# Patient Record
Sex: Female | Born: 1990 | Race: Black or African American | Hispanic: No | Smoking: Former smoker
Health system: Southern US, Community
[De-identification: ages and names within clinical notes are randomized; demographics above are authoritative.]

## PROBLEM LIST (undated history)

## (undated) ENCOUNTER — Inpatient Hospital Stay (HOSPITAL_COMMUNITY): Payer: Self-pay

## (undated) DIAGNOSIS — A6000 Herpesviral infection of urogenital system, unspecified: Secondary | ICD-10-CM

## (undated) DIAGNOSIS — IMO0001 Reserved for inherently not codable concepts without codable children: Secondary | ICD-10-CM

## (undated) DIAGNOSIS — F1010 Alcohol abuse, uncomplicated: Secondary | ICD-10-CM

## (undated) DIAGNOSIS — D649 Anemia, unspecified: Secondary | ICD-10-CM

## (undated) HISTORY — PX: TONSILLECTOMY: SUR1361

---
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
## 2010-06-03 ENCOUNTER — Ambulatory Visit: Payer: Self-pay | Admitting: Family Medicine

## 2010-06-03 DIAGNOSIS — R350 Frequency of micturition: Secondary | ICD-10-CM | POA: Insufficient documentation

## 2010-06-03 DIAGNOSIS — S335XXA Sprain of ligaments of lumbar spine, initial encounter: Secondary | ICD-10-CM

## 2010-06-03 DIAGNOSIS — N898 Other specified noninflammatory disorders of vagina: Secondary | ICD-10-CM | POA: Insufficient documentation

## 2010-06-03 LAB — CONVERTED CEMR LAB
Ketones, urine, test strip: NEGATIVE
Nitrite: NEGATIVE
Specific Gravity, Urine: >=1.03
Urobilinogen, UA: 0.2
WBC Urine, dipstick: NEGATIVE

## 2010-06-04 ENCOUNTER — Encounter: Payer: Self-pay | Admitting: Family Medicine

## 2010-06-07 LAB — CONVERTED CEMR LAB: GC Probe Amp, Urine: NEGATIVE

## 2010-08-17 ENCOUNTER — Emergency Department (HOSPITAL_COMMUNITY): Admission: EM | Admit: 2010-08-17 | Discharge: 2010-08-18 | Payer: Self-pay | Admitting: Emergency Medicine

## 2011-01-08 LAB — CONVERTED CEMR LAB
ALT: 13 units/L (ref 0–35)
Albumin: 3.3 g/dL — ABNORMAL LOW (ref 3.5–5.2)
Basophils Relative: 0.7 % (ref 0.0–3.0)
Bilirubin, Direct: 0 mg/dL (ref 0.0–0.3)
CO2: 27 meq/L (ref 19–32)
Chloride: 108 meq/L (ref 96–112)
Creatinine, Ser: 0.7 mg/dL (ref 0.4–1.2)
Eosinophils Absolute: 0 10*3/uL (ref 0.0–0.7)
Eosinophils Relative: 0.3 % (ref 0.0–5.0)
HCT: 31.3 % — ABNORMAL LOW (ref 36.0–46.0)
Hemoglobin: 10.2 g/dL — ABNORMAL LOW (ref 12.0–15.0)
Hgb A1c MFr Bld: 5.7 % (ref 4.6–6.5)
LDL Cholesterol: 73 mg/dL (ref 0–99)
Lymphs Abs: 1.8 10*3/uL (ref 0.7–4.0)
MCHC: 32.7 g/dL (ref 30.0–36.0)
MCV: 82.7 fL (ref 78.0–100.0)
Monocytes Absolute: 0.4 10*3/uL (ref 0.1–1.0)
Neutro Abs: 2.6 10*3/uL (ref 1.4–7.7)
Potassium: 4 meq/L (ref 3.5–5.1)
RBC: 3.78 M/uL — ABNORMAL LOW (ref 3.87–5.11)
Sodium: 140 meq/L (ref 135–145)
Total CHOL/HDL Ratio: 3
Total Protein: 6.5 g/dL (ref 6.0–8.3)
Triglycerides: 47 mg/dL (ref 0.0–149.0)
Uric Acid, Serum: 3 mg/dL (ref 2.4–7.0)
Vitamin B-12: 169 pg/mL — ABNORMAL LOW (ref 211–911)
WBC: 4.8 10*3/uL (ref 4.5–10.5)

## 2011-01-12 NOTE — Assessment & Plan Note (Signed)
Summary: HEADACHE AND BACK PAIN--PH   Vital Signs:  Patient profile:   20 year old female Weight:      207 pounds BMI:     32.78 Pulse rate:   78 / minute Pulse rhythm:   regular BP sitting:   120 / 78  (left arm) Cuff size:   large  Vitals Entered By: Army Fossa CMA (June 03, 2010 1:49 PM) CC: Pt here states that she has lower left back pain, urinating more frequently, Back Pain   History of Present Illness:       This is a 20 year old woman who presents with Back Pain.  The symptoms began 1 week ago.  The patient denies fever, chills, weakness, loss of sensation, fecal incontinence, urinary incontinence, urinary retention, dysuria, rest pain, inability to work, and inability to care for self.  The pain is located in the left low back.  The pain began at home and gradually.  The pain is made worse by lying down.  The pain is made better by acetaminophen and heat.    Current Medications (verified): 1)  Mobic 15 Mg Tabs (Meloxicam) .... 1/2-1 By Mouth Once Daily As Needed 2)  Flexeril 10 Mg Tabs (Cyclobenzaprine Hcl) .... 1/2 - 1 By Mouth Three Times A Day As Needed  Allergies (verified): No Known Drug Allergies  Past History:  Past medical, surgical, family and social histories (including risk factors) reviewed for relevance to current acute and chronic problems.  Family History: Reviewed history from 05/06/2009 and no changes required. Family History of Alcoholism/Addiction Family History of CAD Female 1st degree relative <50 Family History Hypertension Family History of Stroke F 1st degree relative <60 Uncles--- cancer  Social History: Reviewed history from 05/06/2009 and no changes required. Occupation:  Consulting civil engineer,  [employer] Never Smoked Alcohol use-yes Drug use-no Regular exercise-no  Review of Systems      See HPI  Physical Exam  General:  Well-developed,well-nourished,in no acute distress; alert,appropriate and cooperative throughout  examination Abdomen:  Bowel sounds positive,abdomen soft and non-tender without masses, organomegaly or hernias noted. Msk:  normal ROM, no joint tenderness, no joint swelling, and no joint warmth.   Extremities:  No clubbing, cyanosis, edema, or deformity noted with normal full range of motion of all joints.   Neurologic:  alert & oriented X3, strength normal in all extremities, gait normal, and DTRs symmetrical and normal.   Skin:  Intact without suspicious lesions or rashes Psych:  Oriented X3 and normally interactive.     Impression & Recommendations:  Problem # 1:  BACK STRAIN, LUMBAR (ICD-847.2)  flexeril three times a day as needed  mobic once daily as needed  heat  stretching rto 2 weeks or sooner   Orders: UA Dipstick w/o Micro (manual) (62130)  Problem # 2:  FREQUENCY, URINARY (ICD-788.41)  Orders: T-Culture, Urine ([id number]) UA Dipstick w/o Micro (manual) (95284)  Complete Medication List: 1)  Mobic 15 Mg Tabs (Meloxicam) .... 1/2-1 by mouth once daily as needed 2)  Flexeril 10 Mg Tabs (Cyclobenzaprine hcl) .... 1/2 - 1 by mouth three times a day as needed  Other Orders: T-Chlamydia  Probe, urine [id number]) T-GC Probe, urine ([id number]) Prescriptions: FLEXERIL 10 MG TABS (CYCLOBENZAPRINE HCL) 1/2 - 1 by mouth three times a day as needed  #30 x 0   Entered and Authorized by:   Loreen Freud DO   Signed by:   Loreen Freud DO on 06/03/2010   Method used:   Electronically to  [facility]  [address] [id number] (retail)       [address]       [place], [place]  [address]       Ph: [phone] or [phone]       Fax: [phone]   RxID:   [id number] MOBIC 15 MG TABS (MELOXICAM) 1/2-1 by mouth once daily as needed  #30 x 1   Entered and Authorized by:   Loreen Freud DO   Signed by:   Loreen Freud DO on 06/03/2010   Method used:   [facility]ically to        [facility]  [address] [id number] ([facility]       [address]       [place], [place]  [address]       Ph: [phone] or [phone]       Fax: [phone]   RxID:   [id number]   Laboratory Results   Urine Tests    Routine Urinalysis   Color: straw Appearance: Clear Glucose: negative   (Normal Range: Negative) Bilirubin: small   (Normal Range: Negative) Ketone: negative   (Normal Range: Negative) Spec. Gravity: >=1.030   (Normal Range: 1.003-1.035) Blood: negative   (Normal Range: Negative) pH: 6.5   (Normal Range: 5.0-8.0) Protein: negative   (Normal Range: Negative) Urobilinogen: 0.2   (Normal Range: 0-1) Nitrite: negative   (Normal Range: Negative) Leukocyte Esterace: negative   (Normal Range: Negative)    Comments: Army Fossa CMA  June 03, 2010 1:56 PM

## 2011-02-26 LAB — URINALYSIS, ROUTINE W REFLEX MICROSCOPIC
Bilirubin Urine: NEGATIVE
Glucose, UA: NEGATIVE mg/dL
Leukocytes, UA: NEGATIVE
Nitrite: NEGATIVE
Specific Gravity, Urine: 1.021 (ref 1.005–1.030)
pH: 8.5 — ABNORMAL HIGH (ref 5.0–8.0)

## 2011-02-26 LAB — URINE MICROSCOPIC-ADD ON

## 2011-04-25 NOTE — Op Note (Signed)
Elizabeth David, Elizabeth David              ACCOUNT NO.:  [id number]   MEDICAL RECORD NO.:  [id number]          PATIENT TYPE:  AMB   LOCATION:  [facility]                          FACILITY:  [facility]   PHYSICIAN:  Suzanna Obey, M.D.       DATE OF BIRTH:  Dec 12, 1990   DATE OF PROCEDURE:  11/28/2007  DATE OF DISCHARGE:                               OPERATIVE REPORT   PREOPERATIVE DIAGNOSIS:  Chronic tonsillitis.   POSTOPERATIVE DIAGNOSIS:  Chronic tonsillitis.   SURGICAL PROCEDURE:  Tonsillectomy and adenoidectomy.   ANESTHESIA:  General.   ESTIMATED BLOOD LOSS:  Less than 5 mL.   INDICATIONS FOR PROCEDURE:  This is a 20 year old with repetitive  tonsillitis problems that had been refractory to medical therapy.  The  parents were informed of the risks and benefits of the procedure and  options were discussed.  All questions were answered and consent was  obtained.   OPERATION:  The patient was taken to the operating room and placed in  the supine position.  After adequate general endotracheal tube  anesthesia, was placed in the Rose position, draped in the usual sterile  fashion.  The left tonsil was begun by making a left anterior tonsillar  pillar incision, identifying the capsule of the tonsil, and removing it  with electrocautery dissection.  The right tonsil was removed in the  same fashion.  The adenoid tissue was examined with a mirror and removed  with the suction cautery.  There was a moderate amount of adenoid tissue  present.  The Crowe-Davis was released and resuspended.  There was good  hemostasis present in all locations.  The nasopharynx was irrigated with  saline.  The Crowe-Davis was released again and there was good  hemostasis with suction cautery.  The patient was awakened and brought  to the recovery room in stable condition.  Counts were correct.           ______________________________  Suzanna Obey, M.D.     JB/MEDQ  D:  11/28/2007  T:  11/28/2007  Job:  [id number]

## 2011-06-04 ENCOUNTER — Inpatient Hospital Stay (INDEPENDENT_AMBULATORY_CARE_PROVIDER_SITE_OTHER)
Admission: RE | Admit: 2011-06-04 | Discharge: 2011-06-04 | Disposition: A | Discharge status: Home / Self Care | Payer: Managed Care, Other (non HMO) | Source: Ambulatory Visit | Attending: Family Medicine | Admitting: Family Medicine

## 2011-06-04 DIAGNOSIS — M5430 Sciatica, unspecified side: Secondary | ICD-10-CM

## 2011-06-26 ENCOUNTER — Ambulatory Visit (INDEPENDENT_AMBULATORY_CARE_PROVIDER_SITE_OTHER): Payer: Managed Care, Other (non HMO) | Admitting: Family Medicine

## 2011-06-26 ENCOUNTER — Encounter: Payer: Self-pay | Admitting: Family Medicine

## 2011-06-26 ENCOUNTER — Telehealth: Payer: Self-pay

## 2011-06-26 ENCOUNTER — Ambulatory Visit (HOSPITAL_COMMUNITY)
Admission: RE | Admit: 2011-06-26 | Discharge: 2011-06-26 | Disposition: A | Discharge status: Home / Self Care | Payer: Managed Care, Other (non HMO) | Source: Ambulatory Visit | Attending: Family Medicine | Admitting: Family Medicine

## 2011-06-26 VITALS — BP 138/80 | HR 124 | Temp 99.1°F | Wt 220.4 lb

## 2011-06-26 DIAGNOSIS — M5136 Other intervertebral disc degeneration, lumbar region: Secondary | ICD-10-CM

## 2011-06-26 DIAGNOSIS — M545 Low back pain, unspecified: Secondary | ICD-10-CM | POA: Insufficient documentation

## 2011-06-26 DIAGNOSIS — M549 Dorsalgia, unspecified: Secondary | ICD-10-CM

## 2011-06-26 DIAGNOSIS — M5137 Other intervertebral disc degeneration, lumbosacral region: Secondary | ICD-10-CM | POA: Insufficient documentation

## 2011-06-26 DIAGNOSIS — M419 Scoliosis, unspecified: Secondary | ICD-10-CM

## 2011-06-26 DIAGNOSIS — IMO0002 Reserved for concepts with insufficient information to code with codable children: Secondary | ICD-10-CM | POA: Insufficient documentation

## 2011-06-26 DIAGNOSIS — M51369 Other intervertebral disc degeneration, lumbar region without mention of lumbar back pain or lower extremity pain: Secondary | ICD-10-CM

## 2011-06-26 DIAGNOSIS — M79609 Pain in unspecified limb: Secondary | ICD-10-CM | POA: Insufficient documentation

## 2011-06-26 DIAGNOSIS — M51379 Other intervertebral disc degeneration, lumbosacral region without mention of lumbar back pain or lower extremity pain: Secondary | ICD-10-CM | POA: Insufficient documentation

## 2011-06-26 DIAGNOSIS — M4120 Other idiopathic scoliosis, site unspecified: Secondary | ICD-10-CM | POA: Insufficient documentation

## 2011-06-26 MED ORDER — MELOXICAM 15 MG PO TABS: 15.0000 mg | ORAL_TABLET | Freq: Every day | ORAL | Status: DC

## 2011-06-26 NOTE — Telephone Encounter (Signed)
Discussed with patient and she voiced understanding. Referral put in        KP 

## 2011-06-26 NOTE — Patient Instructions (Signed)
Back Pain & Injury Your back pain is most likely caused by a strain of the muscles or ligaments supporting the spine. Back strains cause pain and trouble moving because of muscle spasms. They may take several weeks to heal. Usually they are better in days.  Treatment for back pain includes:  Rest - Get bed rest as needed over the next day or two. Use a firm mattress and lie on your side with your knees slightly bent. If you lie on your back, put a pillow under your knees.   Early movement - Back pain improves most rapidly if you remain active. It is much more stressful on the back to sit or stand in one place. Do not sit, drive or stand in one place for more than 30 minutes at a time. Take short walks on level surfaces as soon as pain allows.   Limit bending and lifting - Do not bend over or lift anything over 20 pounds until instructed otherwise. Lift by bending your knees. Use your leg muscles to help. Keep the load close to your body and avoid twisting. Do not reach or do overhead work.   Medicines - Medicine to reduce pain and inflammation are helpful. Muscle-relaxing drugs may be prescribed.   Therapy - Put ice packs on your back every few hours for the first 2-3 days after your injury or as instructed. After that ice or heat may be alternated to reduce pain and spasm. Back exercises and gentle massage may be of some benefit. You should be examined again if your back pain is not better in one week.  SEEK IMMEDIATE MEDICAL CARE IF:  You have pain that radiates from your back into your legs.   You develop new bowel or bladder control problems.   You have unusual weakness or numbness in your arms or legs.   You develop nausea or vomiting.   You develop abdominal pain.   You feel faint.  Document Released: 11/27/2005 Document Re-Released: 09/05/2008 Exit[employer] Patient Information 2011 [employer]. 

## 2011-06-26 NOTE — Progress Notes (Signed)
  Subjective:    Patient ID: Elizabeth David, female    DOB: July 16, 1991, 20 y.o.   MRN: [id number]  HPI    Review of Systems     Objective:   Physical Exam        Assessment & Plan:   Subjective:    Elizabeth David is a 20 y.o. female who presents for follow up of low back problems. Current symptoms include: numbness in Left foot and leg and pain in L leg/ foot (burning and throbbing in character; 9/10 in severity). Symptoms have significantly improved from the previous visit. Exacerbating factors identified by the patient are recumbency, sitting, standing and walking.  The following portions of the patient's history were reviewed and updated as appropriate: allergies, current medications, past family history, past medical history, past social history, past surgical history and problem list.    Objective:    BP 138/80  Pulse 124  Temp(Src) 99.1 F (37.3 C) (Oral)  Wt 220 lb 6.4 oz (99.973 kg)  SpO2 99% General appearance: alert, cooperative, appears stated age and no distress Extremities: extremities normal, atraumatic, no cyanosis or edema Pulses: 2+ and symmetric Skin: Skin color, texture, turgor normal. No rashes or lesions Neurologic: Sensory: normal Motor: decreased plantar flexion of L foot and toe.  4/5 strengh L low leg Reflexes: normal 2+ and symmetric Coordination: normal Gait: Antalgic    Assessment:    Nonspecific acute low back pain    Plan:    Natural history and expected course discussed. Questions answered. Agricultural engineer distributed. Short (2-4 day) period of relative rest recommended until acute symptoms improve. Ice to affected area as needed for local pain relief. Heat to affected area as needed for local pain relief. OTC analgesics as needed. Follow-up in 2 weeks. mobic given to pt and check xray

## 2011-06-26 NOTE — Telephone Encounter (Signed)
Message copied by Arnette Norris on Mon Jun 26, 2011  2:44 PM ------      Message from: Lelon Perla      Created: Mon Jun 26, 2011  1:03 PM       Some scoliosis and degen changes      We can refer to sports med

## 2011-09-13 LAB — CBC
HCT: 39.3
Hemoglobin: 12.9
Platelets: 322
RBC: 4.79
WBC: 6.5

## 2011-09-13 LAB — URINE CULTURE

## 2011-09-13 LAB — DIFFERENTIAL
Eosinophils Relative: 0
Lymphocytes Relative: 23 — ABNORMAL LOW
Lymphs Abs: 1.5
Monocytes Relative: 7

## 2011-09-13 LAB — URINALYSIS, ROUTINE W REFLEX MICROSCOPIC
Bilirubin Urine: NEGATIVE
Glucose, UA: NEGATIVE
Hgb urine dipstick: NEGATIVE
Ketones, ur: NEGATIVE
Protein, ur: NEGATIVE
pH: 6

## 2011-09-13 LAB — BASIC METABOLIC PANEL
BUN: 8
Potassium: 3.9
Sodium: 138

## 2011-09-13 LAB — RPR: RPR Ser Ql: NONREACTIVE

## 2011-09-13 LAB — URINE MICROSCOPIC-ADD ON

## 2011-09-13 LAB — WET PREP, GENITAL

## 2011-09-15 LAB — POCT HEMOGLOBIN-HEMACUE: Operator id: 12362

## 2011-09-25 ENCOUNTER — Other Ambulatory Visit: Payer: Self-pay | Admitting: Obstetrics & Gynecology

## 2011-09-29 ENCOUNTER — Emergency Department (HOSPITAL_COMMUNITY)
Admission: EM | Admit: 2011-09-29 | Discharge: 2011-09-29 | Disposition: A | Discharge status: Home / Self Care | Payer: Managed Care, Other (non HMO) | Attending: Emergency Medicine | Admitting: Emergency Medicine

## 2011-09-29 DIAGNOSIS — O99330 Smoking (tobacco) complicating pregnancy, unspecified trimester: Secondary | ICD-10-CM | POA: Insufficient documentation

## 2011-09-29 DIAGNOSIS — O036 Delayed or excessive hemorrhage following complete or unspecified spontaneous abortion: Secondary | ICD-10-CM | POA: Insufficient documentation

## 2011-09-29 LAB — DIFFERENTIAL
Basophils Relative: 0 % (ref 0–1)
Monocytes Relative: 8 % (ref 3–12)
Neutro Abs: 5.1 10*3/uL (ref 1.7–7.7)
Neutrophils Relative %: 67 % (ref 43–77)

## 2011-09-29 LAB — CBC
HCT: 31.8 % — ABNORMAL LOW (ref 36.0–46.0)
MCH: 28 pg (ref 26.0–34.0)
MCV: 84.1 fL (ref 78.0–100.0)
Platelets: 314 10*3/uL (ref 150–400)
RBC: 3.78 MIL/uL — ABNORMAL LOW (ref 3.87–5.11)
WBC: 7.6 10*3/uL (ref 4.0–10.5)

## 2011-09-29 LAB — BASIC METABOLIC PANEL
BUN: 8 mg/dL (ref 6–23)
CO2: 24 mEq/L (ref 19–32)
Chloride: 104 mEq/L (ref 96–112)
Creatinine, Ser: 0.6 mg/dL (ref 0.50–1.10)
GFR calc Af Amer: >90 mL/min (ref >90)
Glucose, Bld: 99 mg/dL (ref 70–99)
Potassium: 3.2 mEq/L — ABNORMAL LOW (ref 3.5–5.1)

## 2011-09-30 ENCOUNTER — Encounter (HOSPITAL_COMMUNITY): Admission: RE | Payer: Self-pay | Source: Ambulatory Visit

## 2011-09-30 ENCOUNTER — Ambulatory Visit (HOSPITAL_COMMUNITY)
Admission: RE | Admit: 2011-09-30 | Payer: Managed Care, Other (non HMO) | Source: Ambulatory Visit | Admitting: Obstetrics and Gynecology

## 2011-09-30 SURGERY — DILATION AND EVACUATION, UTERUS
Anesthesia: Choice

## 2011-12-21 ENCOUNTER — Encounter: Payer: Self-pay | Admitting: Family Medicine

## 2011-12-21 ENCOUNTER — Ambulatory Visit (INDEPENDENT_AMBULATORY_CARE_PROVIDER_SITE_OTHER): Payer: Managed Care, Other (non HMO) | Admitting: Family Medicine

## 2011-12-21 DIAGNOSIS — T380X5A Adverse effect of glucocorticoids and synthetic analogues, initial encounter: Secondary | ICD-10-CM

## 2011-12-21 DIAGNOSIS — J329 Chronic sinusitis, unspecified: Secondary | ICD-10-CM

## 2011-12-21 DIAGNOSIS — J40 Bronchitis, not specified as acute or chronic: Secondary | ICD-10-CM

## 2011-12-21 MED ORDER — GUAIFENESIN-CODEINE 100-10 MG/5ML PO SYRP: ORAL_SOLUTION | ORAL | Status: DC

## 2011-12-21 MED ORDER — PREDNISONE 10 MG PO TABS: ORAL_TABLET | ORAL | Status: DC

## 2011-12-21 MED ORDER — AMOXICILLIN-POT CLAVULANATE 875-125 MG PO TABS: 1.0000 | ORAL_TABLET | Freq: Two times a day (BID) | ORAL | Status: AC

## 2011-12-21 NOTE — Patient Instructions (Signed)
Bronchitis Bronchitis is the body's way of reacting to injury and/or infection (inflammation) of the bronchi. Bronchi are the air tubes that extend from the windpipe into the lungs. If the inflammation becomes severe, it may cause shortness of breath. CAUSES  Inflammation may be caused by:  A virus.   Germs (bacteria).   Dust.   Allergens.   Pollutants and many other irritants.  The cells lining the bronchial tree are covered with tiny hairs (cilia). These constantly beat upward, away from the lungs, toward the mouth. This keeps the lungs free of pollutants. When these cells become too irritated and are unable to do their job, mucus begins to develop. This causes the characteristic cough of bronchitis. The cough clears the lungs when the cilia are unable to do their job. Without either of these protective mechanisms, the mucus would settle in the lungs. Then you would develop pneumonia. Smoking is a common cause of bronchitis and can contribute to pneumonia. Stopping this habit is the single most important thing you can do to help yourself. TREATMENT   Your caregiver may prescribe an antibiotic if the cough is caused by bacteria. Also, medicines that open up your airways make it easier to breathe. Your caregiver may also recommend or prescribe an expectorant. It will loosen the mucus to be coughed up. Only take over-the-counter or prescription medicines for pain, discomfort, or fever as directed by your caregiver.   Removing whatever causes the problem (smoking, for example) is critical to preventing the problem from getting worse.   Cough suppressants may be prescribed for relief of cough symptoms.   Inhaled medicines may be prescribed to help with symptoms now and to help prevent problems from returning.   For those with recurrent (chronic) bronchitis, there may be a need for steroid medicines.  SEEK IMMEDIATE MEDICAL CARE IF:   During treatment, you develop more pus-like mucus  (purulent sputum).   You have a fever.   Your baby is older than 3 months with a rectal temperature of 102 F (38.9 C) or higher.   Your baby is 3 months old or younger with a rectal temperature of 100.4 F (38 C) or higher.   You become progressively more ill.   You have increased difficulty breathing, wheezing, or shortness of breath.  It is necessary to seek immediate medical care if you are elderly or sick from any other disease. MAKE SURE YOU:   Understand these instructions.   Will watch your condition.   Will get help right away if you are not doing well or get worse.  Document Released: 11/27/2005 Document Revised: 08/09/2011 Document Reviewed: 10/06/2008 Exit[employer] Patient Information 2012 [employer].Sinusitis Sinuses are air pockets within the bones of your face. The growth of bacteria within a sinus leads to infection. The infection prevents the sinuses from draining. This infection is called sinusitis. SYMPTOMS  There will be different areas of pain depending on which sinuses have become infected.  The maxillary sinuses often produce pain beneath the eyes.   Frontal sinusitis may cause pain in the middle of the forehead and above the eyes.  Other problems (symptoms) include:  Toothaches.   Colored, pus-like (purulent) drainage from the nose.   Swelling, warmth, and tenderness over the sinus areas may be signs of infection.  TREATMENT  Sinusitis is most often determined by an exam.X-rays may be taken. If x-rays have been taken, make sure you obtain your results or find out how you are to obtain them. Your caregiver   may give you medications (antibiotics). These are medications that will help kill the bacteria causing the infection. You may also be given a medication (decongestant) that helps to reduce sinus swelling.  HOME CARE INSTRUCTIONS   Only take over-the-counter or prescription medicines for pain, discomfort, or fever as directed by your caregiver.    Drink extra fluids. Fluids help thin the mucus so your sinuses can drain more easily.   Applying either moist heat or ice packs to the sinus areas may help relieve discomfort.   Use saline nasal sprays to help moisten your sinuses. The sprays can be found at your local drugstore.  SEEK IMMEDIATE MEDICAL CARE IF:  You have a fever.   You have increasing pain, severe headaches, or toothache.   You have nausea, vomiting, or drowsiness.   You develop unusual swelling around the face or trouble seeing.  MAKE SURE YOU:   Understand these instructions.   Will watch your condition.   Will get help right away if you are not doing well or get worse.  Document Released: 11/27/2005 Document Revised: 08/09/2011 Document Reviewed: 06/26/2007 Exit[employer] Patient Information 2012 [employer]. 

## 2011-12-21 NOTE — Progress Notes (Signed)
  Subjective:     Elizabeth David is a 21 y.o. female who presents for evaluation of symptoms of a URI. Symptoms include bilateral ear pressure/pain, congestion, no  fever, productive cough with  green colored sputum, purulent nasal discharge, sinus pressure and sore throat. Onset of symptoms was 3 days ago, and has been gradually worsening since that time. Treatment to date: mucinex.  The following portions of the patient's history were reviewed and updated as appropriate: allergies, current medications, past family history, past medical history, past social history, past surgical history and problem list.  Review of Systems Pertinent items are noted in HPI.   Objective:    BP 132/76  Pulse 96  Temp(Src) 99.5 F (37.5 C) (Oral)  Wt 213 lb 3.2 oz (96.707 kg)  SpO2 98% General appearance: alert, cooperative, appears stated age and no distress Ears: normal TM's and external ear canals both ears Nose: clear discharge, moderate congestion, sinus tenderness bilateral Throat: abnormal findings: mild oropharyngeal erythema Neck: mild anterior cervical adenopathy, supple, symmetrical, trachea midline and thyroid not enlarged, symmetric, no tenderness/mass/nodules Lungs: diminished breath sounds bilaterally and wheezes RLL Heart: regular rate and rhythm, S1, S2 normal, no murmur, click, rub or gallop Extremities: extremities normal, atraumatic, no cyanosis or edema   Assessment:    bronchitis and sinusitis   Plan:    Discussed the diagnosis and treatment of sinusitis. Suggested symptomatic OTC remedies. Nasal saline spray for congestion. Augmentin per orders. Nasal steroids per orders. Follow up as needed. Follow up in a few days or as needed.

## 2012-07-04 ENCOUNTER — Emergency Department (HOSPITAL_COMMUNITY)
Admission: EM | Admit: 2012-07-04 | Discharge: 2012-07-04 | Disposition: A | Discharge status: Home / Self Care | Payer: Self-pay | Attending: Emergency Medicine | Admitting: Emergency Medicine

## 2012-07-04 ENCOUNTER — Encounter (HOSPITAL_COMMUNITY): Payer: Self-pay | Admitting: *Deleted

## 2012-07-04 DIAGNOSIS — O210 Mild hyperemesis gravidarum: Secondary | ICD-10-CM | POA: Insufficient documentation

## 2012-07-04 DIAGNOSIS — F17200 Nicotine dependence, unspecified, uncomplicated: Secondary | ICD-10-CM | POA: Insufficient documentation

## 2012-07-04 DIAGNOSIS — Z3490 Encounter for supervision of normal pregnancy, unspecified, unspecified trimester: Secondary | ICD-10-CM

## 2012-07-04 DIAGNOSIS — K5900 Constipation, unspecified: Secondary | ICD-10-CM | POA: Insufficient documentation

## 2012-07-04 LAB — CBC WITH DIFFERENTIAL/PLATELET
Basophils Relative: 0 % (ref 0–1)
Eosinophils Absolute: 0 10*3/uL (ref 0.0–0.7)
Eosinophils Relative: 0 % (ref 0–5)
HCT: 32.1 % — ABNORMAL LOW (ref 36.0–46.0)
Hemoglobin: 11.2 g/dL — ABNORMAL LOW (ref 12.0–15.0)
Lymphs Abs: 2 10*3/uL (ref 0.7–4.0)
MCH: 28.4 pg (ref 26.0–34.0)
MCHC: 34.9 g/dL (ref 30.0–36.0)
MCV: 81.3 fL (ref 78.0–100.0)
Monocytes Absolute: 0.8 10*3/uL (ref 0.1–1.0)
Monocytes Relative: 9 % (ref 3–12)
Neutrophils Relative %: 69 % (ref 43–77)
RBC: 3.95 MIL/uL (ref 3.87–5.11)

## 2012-07-04 LAB — BASIC METABOLIC PANEL
BUN: 12 mg/dL (ref 6–23)
Creatinine, Ser: 0.53 mg/dL (ref 0.50–1.10)
GFR calc Af Amer: >90 mL/min (ref >90)
GFR calc non Af Amer: >90 mL/min (ref >90)
Glucose, Bld: 90 mg/dL (ref 70–99)
Potassium: 3.3 mEq/L — ABNORMAL LOW (ref 3.5–5.1)

## 2012-07-04 LAB — URINALYSIS, ROUTINE W REFLEX MICROSCOPIC
Bilirubin Urine: NEGATIVE
Hgb urine dipstick: NEGATIVE
Ketones, ur: NEGATIVE mg/dL
Nitrite: NEGATIVE
Specific Gravity, Urine: 1.03 (ref 1.005–1.030)
Urobilinogen, UA: 0.2 mg/dL (ref 0.0–1.0)
pH: 7.5 (ref 5.0–8.0)

## 2012-07-04 MED ORDER — POTASSIUM CHLORIDE CRYS ER 20 MEQ PO TBCR
20.0000 meq | EXTENDED_RELEASE_TABLET | Freq: Once | ORAL | Status: AC
  Administered 2012-07-04: 20 meq via ORAL
  Filled 2012-07-04: qty 1

## 2012-07-04 NOTE — ED Notes (Addendum)
Pt reports n/v x3 weeks. No emesis today, +nausea, "slight abd cramping." LMP end of May but usually irregular. Possibility of pregnancy. Denies vaginal bleeding or d/c. Also c/o constipation x3 weeks.

## 2012-07-04 NOTE — ED Provider Notes (Signed)
History     CSN: [id number]  Arrival date & time 07/04/12  1624   First MD Initiated Contact with Patient 07/04/12 1821      Chief Complaint  Patient presents with  . Nausea  . Emesis    (Consider location/radiation/quality/duration/timing/severity/associated sxs/prior treatment) HPI  21 year old female presents complaining of nausea and vomiting for the past 3 weeks. Reports she has decreased appetite and had been having several bouts of nonbloody, nonbilious emesis, but no emesis today. Onset was gradual, waxing and waning, with a mild abdominal cramping which comes and goes. Patient also felt constipated. Last bowel movement was 2 days ago, she usually goes once daily. Patient also reports her last menstrual period was at the end of May, but she is usually irregular. She denies any vaginal bleeding, or vaginal discharge. She is sexually active with her husband. She was pregnant once but has a miscarriage. Patient denies fever, chills, chest pain, shortness of breath, back pain, urinary symptoms.  Past Medical History  Diagnosis Date  . Asthma     History reviewed. No pertinent past surgical history.  No family history on file.  History  Substance Use Topics  . Smoking status: Current Everyday Smoker -- 1.0 packs/day for 3 years    Types: Cigarettes  . Smokeless tobacco: Never Used  . Alcohol Use: No    OB History    Grav Para Term Preterm Abortions TAB SAB Ect Mult Living                  Review of Systems  All other systems reviewed and are negative.    Allergies  Review of patient's allergies indicates no known allergies.  Home Medications   Current Outpatient Rx  Name Route Sig Dispense Refill  . GUAIFENESIN ER 600 MG PO TB12 Oral Take 1,200 mg by mouth 2 (two) times daily.    . GUAIFENESIN-CODEINE 100-10 MG/5ML PO SYRP  1-2 tsp po qhs prn 120 mL 0  . PREDNISONE 10 MG PO TABS  3 po qd for 3 days then 2 po qd for 3 days the 1 po qd for 3 days 18 tablet 0      BP 136/78  Pulse 101  Temp 98.5 F (36.9 C) (Oral)  Resp 14  SpO2 100%  LMP 05/04/2012  Physical Exam  Nursing note and vitals reviewed. Constitutional: She appears well-developed and well-nourished. No distress.       Awake, alert, nontoxic appearance  HENT:  Head: Atraumatic.  Eyes: Conjunctivae are normal. Right eye exhibits no discharge. Left eye exhibits no discharge.  Neck: Neck supple.  Cardiovascular: Normal rate and regular rhythm.   Pulmonary/Chest: Effort normal. No respiratory distress. She exhibits no tenderness.  Abdominal: Soft. There is no tenderness. There is no rebound.  Musculoskeletal: She exhibits no edema and no tenderness.  Neurological:       Mental status and motor strength appears intact  Skin: No rash noted.  Psychiatric: She has a normal mood and affect.    ED Course  Procedures (including critical care time)  Labs Reviewed  CBC WITH DIFFERENTIAL - Abnormal; Notable for the following:    Hemoglobin 11.2 (*)     HCT 32.1 (*)     All other components within normal limits  URINALYSIS, ROUTINE W REFLEX MICROSCOPIC - Abnormal; Notable for the following:    APPearance CLOUDY (*)     All other components within normal limits  POCT PREGNANCY, URINE - Abnormal; Notable for the following:  Preg Test, Ur POSITIVE (*)     All other components within normal limits  BASIC METABOLIC PANEL   No results found.  Results for orders placed during the hospital encounter of 07/04/12  CBC WITH DIFFERENTIAL      Component Value Range   WBC 8.8  4.0 - 10.5 K/uL   RBC 3.95  3.87 - 5.11 MIL/uL   Hemoglobin 11.2 (*) 12.0 - 15.0 g/dL   HCT 40.9 (*) 81.1 - 91.4 %   MCV 81.3  78.0 - 100.0 fL   MCH 28.4  26.0 - 34.0 pg   MCHC 34.9  30.0 - 36.0 g/dL   RDW 78.2  95.6 - 21.3 %   Platelets 324  150 - 400 K/uL   Neutrophils Relative 69  43 - 77 %   Neutro Abs 6.1  1.7 - 7.7 K/uL   Lymphocytes Relative 22  12 - 46 %   Lymphs Abs 2.0  0.7 - 4.0 K/uL    Monocytes Relative 9  3 - 12 %   Monocytes Absolute 0.8  0.1 - 1.0 K/uL   Eosinophils Relative 0  0 - 5 %   Eosinophils Absolute 0.0  0.0 - 0.7 K/uL   Basophils Relative 0  0 - 1 %   Basophils Absolute 0.0  0.0 - 0.1 K/uL  BASIC METABOLIC PANEL      Component Value Range   Sodium 132 (*) 135 - 145 mEq/L   Potassium 3.3 (*) 3.5 - 5.1 mEq/L   Chloride 100  96 - 112 mEq/L   CO2 23  19 - 32 mEq/L   Glucose, Bld 90  70 - 99 mg/dL   BUN 12  6 - 23 mg/dL   Creatinine, Ser 0.86  0.50 - 1.10 mg/dL   Calcium 9.2  8.4 - 57.8 mg/dL   GFR calc non Af Amer >90  >90 mL/min   GFR calc Af Amer >90  >90 mL/min  URINALYSIS, ROUTINE W REFLEX MICROSCOPIC      Component Value Range   Color, Urine YELLOW  YELLOW   APPearance CLOUDY (*) CLEAR   Specific Gravity, Urine 1.030  1.005 - 1.030   pH 7.5  5.0 - 8.0   Glucose, UA NEGATIVE  NEGATIVE mg/dL   Hgb urine dipstick NEGATIVE  NEGATIVE   Bilirubin Urine NEGATIVE  NEGATIVE   Ketones, ur NEGATIVE  NEGATIVE mg/dL   Protein, ur NEGATIVE  NEGATIVE mg/dL   Urobilinogen, UA 0.2  0.0 - 1.0 mg/dL   Nitrite NEGATIVE  NEGATIVE   Leukocytes, UA NEGATIVE  NEGATIVE  POCT PREGNANCY, URINE      Component Value Range   Preg Test, Ur POSITIVE (*) NEGATIVE   1. Pregnancy 2. constipation  MDM  Nausea, vomiting and constipation. Her pregnancy test is positive. Abdomen is nontender on exam. I believe this is related to her pregnancy.  No abnormal vaginal bleeding, or significant abdominal pain concerning for pregnancy complication at this time. Doubt pancreas or biliary etiology. Doubt bowel obstruction. Pt's K+ 3.2.  Potassium supplement given. Patient otherwise in no acute distress plan to have patient followup with OB/GYN for further evaluation. Patient voiced understanding and agrees with plan.        Fayrene Helper, PA-C 07/04/12 1925

## 2012-07-04 NOTE — ED Provider Notes (Signed)
Medical screening examination/treatment/procedure(s) were performed by non-physician practitioner and as supervising physician I was immediately available for consultation/collaboration.   Celene Kras, MD 07/04/12 [phone]

## 2012-07-04 NOTE — ED Notes (Signed)
Pt verbalizes understanding 

## 2012-07-06 ENCOUNTER — Emergency Department (HOSPITAL_COMMUNITY)
Admission: EM | Admit: 2012-07-06 | Discharge: 2012-07-06 | Disposition: A | Discharge status: Home / Self Care | Payer: Self-pay | Attending: Emergency Medicine | Admitting: Emergency Medicine

## 2012-07-06 ENCOUNTER — Emergency Department (HOSPITAL_COMMUNITY): Payer: Self-pay

## 2012-07-06 ENCOUNTER — Encounter (HOSPITAL_COMMUNITY): Payer: Self-pay | Admitting: Emergency Medicine

## 2012-07-06 DIAGNOSIS — O200 Threatened abortion: Secondary | ICD-10-CM | POA: Insufficient documentation

## 2012-07-06 LAB — URINALYSIS, ROUTINE W REFLEX MICROSCOPIC
Bilirubin Urine: NEGATIVE
Glucose, UA: NEGATIVE mg/dL
Nitrite: NEGATIVE
Protein, ur: NEGATIVE mg/dL
Specific Gravity, Urine: 1.034 — ABNORMAL HIGH (ref 1.005–1.030)
Urobilinogen, UA: 0.2 mg/dL (ref 0.0–1.0)
pH: 6.5 (ref 5.0–8.0)

## 2012-07-06 LAB — HCG, QUANTITATIVE, PREGNANCY: hCG, Beta Chain, Quant, S: 79155 m[IU]/mL — ABNORMAL HIGH (ref <5)

## 2012-07-06 LAB — URINE MICROSCOPIC-ADD ON

## 2012-07-06 LAB — WET PREP, GENITAL: Yeast Wet Prep HPF POC: NONE SEEN

## 2012-07-06 LAB — PREGNANCY, URINE: Preg Test, Ur: POSITIVE — AB

## 2012-07-06 NOTE — ED Notes (Signed)
Pt states about [redacted] weeks pregnant, started having some spotting last p.m. Which has gradually picked up since going in to work this morning. States is light pink in color. States she has miscarried around this time in the past. G2P0

## 2012-07-06 NOTE — ED Provider Notes (Signed)
History     CSN: [id number]  Arrival date & time 07/06/12  4098   First MD Initiated Contact with Patient 07/06/12 1007      Chief Complaint  Patient presents with  . Vaginal Bleeding    pregnant about 8 weeks  . Abdominal Cramping    (Consider location/radiation/quality/duration/timing/severity/associated sxs/prior treatment) HPI History from patient. 21 year old who reports that she is approximately [redacted] weeks pregnant presents with abdominal cramping and spotting which started last evening. She was seen in the ED recently with a positive pregnancy test. She did not have any abdominal pain at that time. Reports that she began to experience cramping to the lower abdomen last evening and noticed a small amount of pink spotting as well. Bleeding has not increased in volume since last evening. She denies any nausea or vomiting with this. Denies dizziness, syncope, urinary sx. G2P0. Reports that she has had a spontaneous AB previously at about 2-3 mos into the pregnancy.  Has not yet been seen by OB. Reports she's taking prenatal vits.  Past Medical History  Diagnosis Date  . Asthma     History reviewed. No pertinent past surgical history.  No family history on file.  History  Substance Use Topics  . Smoking status: Former Smoker -- 0.0 packs/day for 3 years    Types: Cigarettes    Quit date: 06/16/2012  . Smokeless tobacco: Never Used  . Alcohol Use: No    OB History    Grav Para Term Preterm Abortions TAB SAB Ect Mult Living                  Review of Systems as per HPI  Allergies  Review of patient's allergies indicates no known allergies.  Home Medications   Current Outpatient Rx  Name Route Sig Dispense Refill  . PRENATAL PLUS DHA 7-0.4-100 MG PO TABS Oral Take by mouth.      BP 143/68  Pulse 77  Temp 99.3 F (37.4 C) (Oral)  Resp 16  SpO2 100%  LMP 05/04/2012  Physical Exam  Nursing note and vitals reviewed. Constitutional: She appears  well-developed and well-nourished. No distress.  HENT:  Head: Normocephalic and atraumatic.  Mouth/Throat: Oropharynx is clear and moist. No oropharyngeal exudate.  Eyes:       Normal appearance  Neck: Normal range of motion.  Cardiovascular: Normal rate, regular rhythm and normal heart sounds.  Exam reveals no gallop and no friction rub.   No murmur heard. Pulmonary/Chest: Effort normal and breath sounds normal. She exhibits no tenderness.  Abdominal: Soft. Bowel sounds are normal. There is no tenderness. There is no rebound and no guarding.  Genitourinary:       Chaperone present during exam Tiny amount white discharge streaked with brown blood in vaginal vault Cervical os closed, no CMT  Musculoskeletal: Normal range of motion. She exhibits no edema.  Neurological: She is alert.  Skin: Skin is warm and dry. She is not diaphoretic.  Psychiatric: She has a normal mood and affect.    ED Course  Procedures (including critical care time)  Labs Reviewed  URINALYSIS, ROUTINE W REFLEX MICROSCOPIC - Abnormal; Notable for the following:    APPearance CLOUDY (*)     Specific Gravity, Urine 1.034 (*)     Hgb urine dipstick MODERATE (*)     Ketones, ur TRACE (*)     Leukocytes, UA SMALL (*)     All other components within normal limits  PREGNANCY, URINE - Abnormal; Notable  for the following:    Preg Test, Ur POSITIVE (*)     All other components within normal limits  WET PREP, GENITAL - Abnormal; Notable for the following:    WBC, Wet Prep HPF POC FEW (*)     All other components within normal limits  URINE MICROSCOPIC-ADD ON - Abnormal; Notable for the following:    Squamous Epithelial / LPF MANY (*)     Bacteria, UA MANY (*)     All other components within normal limits  GC/CHLAMYDIA PROBE AMP, GENITAL  HCG, QUANTITATIVE, PREGNANCY   US Ob Comp Less 14 Wks  07/06/2012  *RADIOLOGY REPORT*  Clinical Data: 21 year old pregnant female with pelvic pain and vaginal bleeding.   OBSTETRIC <14 WK [place] AND TRANSVAGINAL OB US  Technique:  Both transabdominal and transvaginal ultrasound examinations were performed for complete evaluation of the gestation as well as the maternal uterus, adnexal regions, and pelvic cul-de-sac.  Transvaginal technique was performed to assess early pregnancy.  Comparison:  None  Intrauterine gestational sac:  Visualized/normal in shape. Yolk sac: Visualized Embryo: Visualized Cardiac Activity: Visualized Heart Rate: 171 bpm  CRL: 22  mm  8 w  6 d         [place] EDC: 02/09/2013  Maternal uterus/adnexae: A small subchorionic hemorrhage is noted.  The ovaries bilaterally are unremarkable. There is no evidence of adnexal mass. A trace amount of free pelvic fluid is present.  IMPRESSION: Single living intrauterine gestation with estimated gestational age of 8-week 6 days by this ultrasound.  Small subchorionic hemorrhage.  Trace free pelvic fluid.  Original Report Authenticated By: Rosendo Gros, M.D.   US Ob Transvaginal  07/06/2012  *RADIOLOGY REPORT*  Clinical Data: 21 year old pregnant female with pelvic pain and vaginal bleeding.  OBSTETRIC <14 WK [place] AND TRANSVAGINAL OB US  Technique:  Both transabdominal and transvaginal ultrasound examinations were performed for complete evaluation of the gestation as well as the maternal uterus, adnexal regions, and pelvic cul-de-sac.  Transvaginal technique was performed to assess early pregnancy.  Comparison:  None  Intrauterine gestational sac:  Visualized/normal in shape. Yolk sac: Visualized Embryo: Visualized Cardiac Activity: Visualized Heart Rate: 171 bpm  CRL: 22  mm  8 w  6 d         [place] EDC: 02/09/2013  Maternal uterus/adnexae: A small subchorionic hemorrhage is noted.  The ovaries bilaterally are unremarkable. There is no evidence of adnexal mass. A trace amount of free pelvic fluid is present.  IMPRESSION: Single living intrauterine gestation with estimated gestational age of 8-week 6 days by this ultrasound.  Small  subchorionic hemorrhage.  Trace free pelvic fluid.  Original Report Authenticated By: Rosendo Gros, M.D.     1. Threatened abortion       MDM  Patient with recent positive pregnancy test presents with abdominal cramping and spotting which started yesterday. On exam, no tenderness to palpation to the abdomen. Cervical os is closed. Ultrasound shows intrauterine pregnancy at approximately 8 weeks 6 days. Small subchorionic hemorrhage which may be the etiology of bleeding. Pt's blood type is B+ per old labs so no need for rhogam. Findings discussed with patient. Instructed to followup with OB. She is to return to the MAU at [facility] with worsening or changing symptoms. Reasons to return to the ED discussed. Patient verbalized understanding and agreed to plan.        Grant Fontana, PA-C 07/06/12 1259

## 2012-07-06 NOTE — ED Notes (Signed)
Patient transported to US 

## 2012-07-09 LAB — GC/CHLAMYDIA PROBE AMP, GENITAL
Chlamydia, DNA Probe: NEGATIVE
GC Probe Amp, Genital: NEGATIVE

## 2012-07-09 NOTE — ED Provider Notes (Signed)
Medical screening examination/treatment/procedure(s) were performed by non-physician practitioner and as supervising physician I was immediately available for consultation/collaboration.  Narek Kniss, MD 07/09/12 1033 

## 2012-07-13 ENCOUNTER — Encounter (HOSPITAL_COMMUNITY): Payer: Self-pay | Admitting: Emergency Medicine

## 2012-07-13 ENCOUNTER — Emergency Department (HOSPITAL_COMMUNITY)
Admission: EM | Admit: 2012-07-13 | Discharge: 2012-07-13 | Disposition: A | Discharge status: Home / Self Care | Payer: Managed Care, Other (non HMO) | Attending: Emergency Medicine | Admitting: Emergency Medicine

## 2012-07-13 DIAGNOSIS — Z87891 Personal history of nicotine dependence: Secondary | ICD-10-CM | POA: Insufficient documentation

## 2012-07-13 DIAGNOSIS — O200 Threatened abortion: Secondary | ICD-10-CM | POA: Insufficient documentation

## 2012-07-13 LAB — POCT I-STAT, CHEM 8
BUN: 10 mg/dL (ref 6–23)
Calcium, Ion: 1.25 mmol/L — ABNORMAL HIGH (ref 1.12–1.23)
Chloride: 103 mEq/L (ref 96–112)
Creatinine, Ser: 0.7 mg/dL (ref 0.50–1.10)
Sodium: 137 mEq/L (ref 135–145)
TCO2: 21 mmol/L (ref 0–100)

## 2012-07-13 NOTE — ED Notes (Addendum)
Was spotting 1 1/2 weeks ago seen here, now bleeding more heavy, dark red. Hx miscarriage in past (May 30th, 2012). Intermittent cramping.

## 2012-07-13 NOTE — ED Provider Notes (Signed)
History     CSN: [id number] Arrival date & time 07/13/12  1357 First MD Initiated Contact with Patient 07/13/12 1419      Chief Complaint  Patient presents with  . Vaginal Bleeding    [redacted] weeks pregnant    Patient is a 21 y.o. female presenting with vaginal bleeding. The history is provided by the patient.  Vaginal Bleeding This is a recurrent problem. The current episode started yesterday. Progression since onset: Pt had vaginal bleeding 1.5 weeks ago.  She was evaluated in the ED but she started to have bleeding agin. Pertinent negatives include no abdominal pain and no shortness of breath. Nothing aggravates the symptoms. Nothing relieves the symptoms. She has tried nothing for the symptoms.  Pt had a pelvic US that showed an IUP.  She was to follow up with an OB doctor but has not been able to follow up yet.  She noticed some small clots today and came to be rechecked.  Past Medical History  Diagnosis Date  . Asthma     History reviewed. No pertinent past surgical history.  History reviewed. No pertinent family history.  History  Substance Use Topics  . Smoking status: Former Smoker -- 0.0 packs/day for 3 years    Types: Cigarettes    Quit date: 06/16/2012  . Smokeless tobacco: Never Used  . Alcohol Use: No    OB History    Grav Para Term Preterm Abortions TAB SAB Ect Mult Living                  Review of Systems  Constitutional: Negative for fever.  Respiratory: Negative for shortness of breath.   Gastrointestinal: Negative for abdominal pain.  Genitourinary: Positive for vaginal bleeding.  All other systems reviewed and are negative.    Allergies  Review of patient's allergies indicates no known allergies.  Home Medications   Current Outpatient Rx  Name Route Sig Dispense Refill  . PRENATAL PLUS DHA 7-0.4-100 MG PO TABS Oral Take by mouth.      BP 140/71  Pulse 102  Temp 99 F (37.2 C) (Oral)  Resp 16  SpO2 100%  LMP 05/04/2012  Physical Exam    Nursing note and vitals reviewed. Constitutional: She appears well-developed and well-nourished. No distress.  HENT:  Head: Normocephalic and atraumatic.  Right Ear: External ear normal.  Left Ear: External ear normal.  Eyes: Conjunctivae are normal. Right eye exhibits no discharge. Left eye exhibits no discharge. No scleral icterus.  Neck: Neck supple. No tracheal deviation present.  Cardiovascular: Normal rate, regular rhythm and intact distal pulses.   Pulmonary/Chest: Effort normal and breath sounds normal. No stridor. No respiratory distress. She has no wheezes. She has no rales.  Abdominal: Soft. Bowel sounds are normal. She exhibits no distension. There is no tenderness. There is no rebound and no guarding.  Genitourinary: Uterus is enlarged. Uterus is not tender. Cervix exhibits no motion tenderness and no friability. Right adnexum displays no mass and no tenderness. Left adnexum displays no mass and no tenderness. There is bleeding around the vagina.       Os closed, small amount of blood in vault, no active bleeding  Musculoskeletal: She exhibits no edema and no tenderness.  Neurological: She is alert. She has normal strength. No sensory deficit. Cranial nerve deficit:  no gross defecits noted. She exhibits normal muscle tone. She displays no seizure activity. Coordination normal.  Skin: Skin is warm and dry. No rash noted.  Psychiatric: She  has a normal mood and affect.    ED Course  Procedures (including critical care time) RADIOLOGY REPORT* 7/27 Clinical Data: 21 year old pregnant female with pelvic pain and  vaginal bleeding.  OBSTETRIC <14 WK [place] AND TRANSVAGINAL OB US  Technique: Both transabdominal and transvaginal ultrasound  examinations were performed for complete evaluation of the  gestation as well as the maternal uterus, adnexal regions, and  pelvic cul-de-sac. Transvaginal technique was performed to assess  early pregnancy.  Comparison: None  Intrauterine  gestational sac: Visualized/normal in shape.  Yolk sac: Visualized  Embryo: Visualized  Cardiac Activity: Visualized  Heart Rate: 171 bpm  CRL: 22 mm 8 w 6 d [place] EDC: 02/09/2013  Maternal uterus/adnexae:  A small subchorionic hemorrhage is noted.  The ovaries bilaterally are unremarkable.  There is no evidence of adnexal mass.  A trace amount of free pelvic fluid is present.  IMPRESSION:  Single living intrauterine gestation with estimated gestational age  of 8-week 6 days by this ultrasound.  Small subchorionic hemorrhage.  Trace free pelvic fluid.  Original Report Authenticated By: Rosendo Gros, M.D. 09/29/2011 B pos Quant on 7/27 Results for NATTIE, LAZENBY (MRN [id number]) as of 07/13/2012 14:20  Ref. Range 07/06/2012 10:34  hCG, Beta Chain, Quant, S Latest Range: <5 mIU/mL 79155 (H)   Labs Reviewed  POCT I-STAT, CHEM 8 - Abnormal; Notable for the following:    Calcium, Ion 1.25 (*)     Hemoglobin 11.9 (*)     HCT 35.0 (*)     All other components within normal limits  HCG, QUANTITATIVE, PREGNANCY   No results found.   1. Threatened miscarriage in early pregnancy       MDM  Pt with no active bleeding on exam.  Quant hcg ordered for comparison and outpt follow up.  Blood type is B+ and pt has had [place] confirming IUP.  Did have small subchorionic hemorrhage which may be associated with the bleeding.        Celene Kras, MD 07/13/12 [phone]

## 2012-07-13 NOTE — ED Provider Notes (Signed)
Pt informed of her rising quant and the importance of outpatient follow up with her ob gyn  Results for orders placed during the hospital encounter of 07/13/12  HCG, QUANTITATIVE, PREGNANCY      Component Value Range   hCG, Beta Chain, Sharene Butters, [place] [address] (*) <5 mIU/mL  POCT I-STAT, CHEM 8      Component Value Range   Sodium 137  135 - 145 mEq/L   Potassium 3.7  3.5 - 5.1 mEq/L   Chloride 103  96 - 112 mEq/L   BUN 10  6 - 23 mg/dL   Creatinine, Ser 6.21  0.50 - 1.10 mg/dL   Glucose, Bld 97  70 - 99 mg/dL   Calcium, Ion 3.08 (*) 1.12 - 1.23 mmol/L   TCO2 21  0 - 100 mmol/L   Hemoglobin 11.9 (*) 12.0 - 15.0 g/dL   HCT 65.7 (*) 84.6 - 96.2 %     Lyanne Co, MD 07/13/12 [phone]

## 2012-07-15 LAB — GC/CHLAMYDIA PROBE AMP, GENITAL: Chlamydia, DNA Probe: NEGATIVE

## 2012-07-22 ENCOUNTER — Other Ambulatory Visit: Payer: Self-pay | Admitting: Obstetrics and Gynecology

## 2012-07-22 LAB — OB RESULTS CONSOLE ABO/RH: RH Type: POSITIVE

## 2012-09-09 ENCOUNTER — Ambulatory Visit (INDEPENDENT_AMBULATORY_CARE_PROVIDER_SITE_OTHER): Payer: No Typology Code available for payment source | Admitting: Physician Assistant

## 2012-09-09 VITALS — BP 140/80 | HR 110 | Temp 98.7°F | Resp 18 | Ht 67.5 in | Wt 218.0 lb

## 2012-09-09 DIAGNOSIS — R6884 Jaw pain: Secondary | ICD-10-CM

## 2012-09-09 NOTE — Progress Notes (Signed)
   [address] [place] [address]   Phone [phone]  Subjective:    Patient ID: Elizabeth David, female    DOB: [birth year], 21 y.o.   MRN: [id number]  HPI Pt presents to clinic with 24h h/o R lower jaw pain.  It woke her up last pm and today has been intermittent but never gone.  She thinks it gets worse with chewing.  She used tylenol last pm and then this am with minimal pain relief.  She has some ear pain but that started after her jaw started to hurt.  She has had no recent cold symptoms.  She does not really have teeth pain. She has no Fevers/chills and does not feel bad.  She has been using some heat without any relief.  She is [redacted] wks pregnant.  Review of Systems  Constitutional: Negative for fever and chills.  HENT: Positive for ear pain (R only) and dental problem. Negative for hearing loss and congestion.        R jaw pain  Respiratory: Negative for cough.        Objective:   Physical Exam  Constitutional: She is oriented to person, place, and time. She appears well-developed and well-nourished.  HENT:  Head: Normocephalic and atraumatic.  Right Ear: Hearing, external ear and ear canal normal. Tympanic membrane is bulging (serous fluid - mild bulging).  Left Ear: Hearing, tympanic membrane, external ear and ear canal normal.  Nose: Nose normal.  Mouth/Throat: Oropharynx is clear and moist. She does not have dentures. Normal dentition. No dental abscesses or dental caries.       Tenderness along R lower posterior jaw - no teeth TTP, tenderness seems most associated with cheek and over salivary gland (parotid).  Eyes: Conjunctivae normal are normal.  Neck: Neck supple.  Cardiovascular: Normal rate, regular rhythm and normal heart sounds.  Exam reveals no gallop.   No murmur heard. Pulmonary/Chest: Effort normal and breath sounds normal.  Lymphadenopathy:    She has cervical adenopathy.       Right cervical: Superficial cervical (mild TTP) adenopathy present.    Neurological: She is alert and oriented to person, place, and time.  Skin: Skin is warm and dry.  Psychiatric: She has a normal mood and affect. Her behavior is normal. Judgment and thought content normal.          Assessment & Plan:   1. Jaw pain    Do not think referred pain from ear.  Do think that most likely is inflammed salivary gland but do not believe infected due to no fever nor feeling bad.  It is possible that patient has an early tooth abscess but without pain along teeth so not want to start abx due to pregnancy.  Pt till use tylenol and use sore candies to try and push probably salivary stone out of duct.  If no improvement or pt experiences tooth pain she should call for amoxil.  If any fever or patient starts to have swelling over area or overall feeling bad she should RTC.  I do not think the dentist would be helpful for patient at this time.  She can use ice over the area that hurts.

## 2012-09-09 NOTE — Patient Instructions (Addendum)
Sialadenitis Sialadenitis is an inflammation (soreness) of the salivary glands. The parotid is the main salivary gland. It lies behind the angle of the jaw below the ear. The saliva produced comes out of a tiny opening (duct) inside the cheek on either side. This is usually at the level of the upper back teeth. If it is swollen, the ear is pushed up and out. This helps tell this condition apart from a simple lymph gland infection (swollen glands) in the same area. Mumps has mostly disappeared since the start of immunization against mumps. Now the most common cause of parotitis is germ (bacterial) infection or inflammation of the lymphatics (the lymph channels). The other major salivary gland is located in the floor of the mouth. Smaller salivary glands are located in the mouth. This includes the:  Lips.   Lining of the mouth.   Pharynx.   Hard palate (front part of the roof of the mouth).  The salivary glands do many things, including:  Lubrication.   Breaking down food.   Production of hormones and antibodies (to protect against germs which may cause illness).   Help with the sense of taste.  ACUTE BACTERIAL SIALADENITIS This is a sudden inflammatory response to bacterial infection. This causes redness, pain, swelling and tenderness over the infected gland. In the past, it was common in dehydrated and debilitated patients often following an operation. It is now more commonly seen:  After radiotherapy.   In patients with poor immune systems.  Treatment is:  The correction of fluid balance (rehydration).   Medicine that kill germs (antibiotics).   Pain relief.  CHRONIC RECURRENT SIALADENITIS This refers to repeated episodes of discomfort and swelling of one of the salivary glands. It often occurs after eating. Chronic sialadenitis is usually less painful. It is associated with recurrent enlargement of a salivary gland, often following meals, and typically with an absence of redness.  The chronic form of the disease often is associated with conditions linked to decreased salivary flow, rather than dehydration (loss of body fluids). These conditions include:  A stone, or concretion, formed in the gallbladder, kidneys, or other parts of the body (calculi).   Salivary stasis.   A change in the fluid and electrolyte (the salts in your body fluids) makeup of the gland.  It is treated with:  Gland massage.   Methods to stimulate the flow of saliva, (for example, lemon juice).   Antibiotics if required.  Surgery to remove the gland is possible, but its benefits need to be balanced against risks.   OTHER UNCOMMON CAUSES OF SIALADENITIS   Sjogren's syndrome is a condition in which arthritis is associated with a decrease in activity of the glands of the body that produce saliva and tears. The diagnosis is made with blood tests or by examination of a piece of tissue from the inside of the lip. Some people with this condition are bothered by:   A dry mouth.   Intermittent salivary gland enlargement.   Atypical mycobacteria is a germ similar to tuberculosis. It often infects children. It is often resistant to antibiotic treatment. It may require surgical treatment to remove the infected salivary gland.   Actinomycosis is an infection of the parotid gland that may also involve the overlying skin. The diagnosis is made by detecting granules of sulphur produced by the bacteria on microscopic examination. Treatment is a prolonged course of penicillin for up to one year.   Nutritional causes include vitamin deficiencies and bulimia.   Diabetes  and problems with your thyroid.   Obesity, cirrhosis, and malabsorption are some metabolic causes.  HOME CARE INSTRUCTIONS   Apply ice bags every 2 hours for 15 to 20 minutes, while awake, to the sore gland for 24 hours, then as directed by your caregiver. Place the ice in a plastic bag with a towel around it to prevent frostbite to the  skin.   Only take over-the-counter or prescription medicines for pain, discomfort, or fever as directed by your caregiver.  SEEK IMMEDIATE MEDICAL CARE IF:   There is increased pain or swelling in your gland that is not controlled with medicine.   An oral temperature above 102 F (38.9 C) develops, not controlled by medicine.   You develop difficulty opening your mouth, swallowing, or speaking.  Document Released: 05/19/2002 Document Revised: 11/16/2011 Document Reviewed: 07/13/2008 [facility] Patient Information 2012 [place], [place].

## 2012-12-11 NOTE — L&D Delivery Note (Signed)
After admission the pt had an amniotomy with clear fluid. She completed the first stage without difficulty. She pushed briefly. She had a SVD of one live black female over intact perineum in the ROA position. Placenta S/I. EBL-400cc Baby to new born nursery.

## 2013-01-13 ENCOUNTER — Inpatient Hospital Stay (HOSPITAL_COMMUNITY)
Admission: AD | Admit: 2013-01-13 | Discharge: 2013-01-13 | Disposition: A | Discharge status: Hospice | Payer: No Typology Code available for payment source | Source: Ambulatory Visit | Attending: Obstetrics & Gynecology | Admitting: Obstetrics & Gynecology

## 2013-01-13 ENCOUNTER — Encounter (HOSPITAL_COMMUNITY): Payer: Self-pay | Admitting: *Deleted

## 2013-01-13 DIAGNOSIS — O4700 False labor before 37 completed weeks of gestation, unspecified trimester: Secondary | ICD-10-CM | POA: Insufficient documentation

## 2013-01-13 NOTE — Treatment Plan (Signed)
Dr Arlyce Dice notified of pt, including gestational age, ctx pattern, GBS status, cervical exam.  Pt may be d/c home with labor  precautions

## 2013-01-13 NOTE — MAU Note (Signed)
Pt G1 at 36.2wks having contractions every , seen in the office today SVE 6cm with no contractions.

## 2013-01-13 NOTE — MAU Note (Signed)
I was seen in the office today, 6 cm.  Started having contractions q6 minutes

## 2013-01-17 ENCOUNTER — Encounter (HOSPITAL_COMMUNITY): Payer: Self-pay | Admitting: Anesthesiology

## 2013-01-17 ENCOUNTER — Encounter (HOSPITAL_COMMUNITY): Payer: Self-pay | Admitting: *Deleted

## 2013-01-17 ENCOUNTER — Inpatient Hospital Stay (HOSPITAL_COMMUNITY): Payer: No Typology Code available for payment source | Admitting: Anesthesiology

## 2013-01-17 ENCOUNTER — Inpatient Hospital Stay (HOSPITAL_COMMUNITY)
Admission: AD | Admit: 2013-01-17 | Discharge: 2013-01-20 | DRG: 775 | Disposition: A | Discharge status: Home / Self Care | Payer: No Typology Code available for payment source | Source: Ambulatory Visit | Attending: Obstetrics and Gynecology | Admitting: Obstetrics and Gynecology

## 2013-01-17 LAB — CBC
HCT: 33.3 % — ABNORMAL LOW (ref 36.0–46.0)
Hemoglobin: 11.1 g/dL — ABNORMAL LOW (ref 12.0–15.0)
MCHC: 33.3 g/dL (ref 30.0–36.0)
RBC: 4.16 MIL/uL (ref 3.87–5.11)

## 2013-01-17 MED ORDER — LACTATED RINGERS IV SOLN
INTRAVENOUS | Status: DC
  Administered 2013-01-18: via INTRAVENOUS

## 2013-01-17 MED ORDER — LIDOCAINE HCL (PF) 1 % IJ SOLN
INTRAMUSCULAR | Status: DC | PRN
  Administered 2013-01-17 (×2): 9 mL

## 2013-01-17 MED ORDER — LACTATED RINGERS IV SOLN
INTRAVENOUS | Status: DC
  Administered 2013-01-17: 21:00:00 via INTRAVENOUS
  Administered 2013-01-17: 999 mL/h via INTRAVENOUS

## 2013-01-17 MED ORDER — EPHEDRINE 5 MG/ML INJ: 10.0000 mg | INTRAVENOUS | Status: DC | PRN

## 2013-01-17 MED ORDER — LIDOCAINE HCL (PF) 1 % IJ SOLN
30.0000 mL | INTRAMUSCULAR | Status: DC | PRN
  Filled 2013-01-17: qty 30

## 2013-01-17 MED ORDER — DIPHENHYDRAMINE HCL 50 MG/ML IJ SOLN: 12.5000 mg | INTRAMUSCULAR | Status: DC | PRN

## 2013-01-17 MED ORDER — FLEET ENEMA 7-19 GM/118ML RE ENEM: 1.0000 | ENEMA | RECTAL | Status: DC | PRN

## 2013-01-17 MED ORDER — CITRIC ACID-SODIUM CITRATE 334-500 MG/5ML PO SOLN: 30.0000 mL | ORAL | Status: DC | PRN

## 2013-01-17 MED ORDER — ONDANSETRON HCL 4 MG/2ML IJ SOLN
4.0000 mg | Freq: Four times a day (QID) | INTRAMUSCULAR | Status: DC | PRN
  Administered 2013-01-17: 4 mg via INTRAVENOUS
  Filled 2013-01-17: qty 2

## 2013-01-17 MED ORDER — OXYTOCIN BOLUS FROM INFUSION
500.0000 mL | INTRAVENOUS | Status: DC
  Administered 2013-01-18: 500 mL via INTRAVENOUS

## 2013-01-17 MED ORDER — ACETAMINOPHEN 325 MG PO TABS: 650.0000 mg | ORAL_TABLET | ORAL | Status: DC | PRN

## 2013-01-17 MED ORDER — OXYTOCIN 40 UNITS IN LACTATED RINGERS INFUSION - SIMPLE MED
62.5000 mL/h | INTRAVENOUS | Status: DC
  Filled 2013-01-17: qty 1000

## 2013-01-17 MED ORDER — LACTATED RINGERS IV SOLN: 500.0000 mL | INTRAVENOUS | Status: DC | PRN

## 2013-01-17 MED ORDER — EPHEDRINE 5 MG/ML INJ
10.0000 mg | INTRAVENOUS | Status: DC | PRN
  Filled 2013-01-17: qty 4

## 2013-01-17 MED ORDER — PHENYLEPHRINE 40 MCG/ML (10ML) SYRINGE FOR IV PUSH (FOR BLOOD PRESSURE SUPPORT): 80.0000 ug | PREFILLED_SYRINGE | INTRAVENOUS | Status: DC | PRN

## 2013-01-17 MED ORDER — FENTANYL 2.5 MCG/ML BUPIVACAINE 1/10 % EPIDURAL INFUSION (WH - ANES)
14.0000 mL/h | INTRAMUSCULAR | Status: DC
  Filled 2013-01-17: qty 125

## 2013-01-17 MED ORDER — LACTATED RINGERS IV SOLN: 500.0000 mL | Freq: Once | INTRAVENOUS | Status: DC

## 2013-01-17 MED ORDER — OXYCODONE-ACETAMINOPHEN 5-325 MG PO TABS: 1.0000 | ORAL_TABLET | ORAL | Status: DC | PRN

## 2013-01-17 MED ORDER — PHENYLEPHRINE 40 MCG/ML (10ML) SYRINGE FOR IV PUSH (FOR BLOOD PRESSURE SUPPORT)
80.0000 ug | PREFILLED_SYRINGE | INTRAVENOUS | Status: DC | PRN
  Filled 2013-01-17: qty 5

## 2013-01-17 MED ORDER — FENTANYL 2.5 MCG/ML BUPIVACAINE 1/10 % EPIDURAL INFUSION (WH - ANES)
INTRAMUSCULAR | Status: DC | PRN
  Administered 2013-01-17: 14 mL/h via EPIDURAL

## 2013-01-17 MED ORDER — IBUPROFEN 600 MG PO TABS: 600.0000 mg | ORAL_TABLET | Freq: Four times a day (QID) | ORAL | Status: DC | PRN

## 2013-01-17 NOTE — Progress Notes (Signed)
Updated Dr Dareen Piano on FHR, UC pattern, and pt comfort level.  Also updated Dareen Piano that pt GBS status is unknown, but pt states that she was told it was negative in office on Monday.  No new orders given.

## 2013-01-17 NOTE — Anesthesia Procedure Notes (Signed)
Epidural Patient location during procedure: OB Start time: 01/17/2013 9:24 PM End time: 01/17/2013 9:28 PM  Staffing Anesthesiologist: Sandrea Hughs Performed by: anesthesiologist   Preanesthetic Checklist Completed: patient identified, site marked, surgical consent, pre-op evaluation, timeout performed, IV checked, risks and benefits discussed and monitors and equipment checked  Epidural Patient position: sitting Prep: site prepped and draped and DuraPrep Patient monitoring: continuous pulse ox and blood pressure Approach: midline Injection technique: LOR air  Needle:  Needle type: Tuohy  Needle gauge: 17 G Needle length: 9 cm and 9 Needle insertion depth: 6 cm Catheter type: closed end flexible Catheter size: 19 Gauge Catheter at skin depth: 12 cm Test dose: negative and Other  Assessment Sensory level: T8 Events: blood not aspirated, injection not painful, no injection resistance, negative IV test and no paresthesia  Additional Notes Reason for block:procedure for pain

## 2013-01-17 NOTE — Anesthesia Preprocedure Evaluation (Signed)
Anesthesia Evaluation  Patient identified by MRN, date of birth, ID band Patient awake    Reviewed: Allergy & Precautions, H&P , NPO status , Patient's Chart, lab work & pertinent test results  Airway Mallampati: II TM Distance: >3 FB Neck ROM: full    Dental No notable dental hx.    Pulmonary    Pulmonary exam normal       Cardiovascular negative cardio ROS      Neuro/Psych negative neurological ROS  negative psych ROS   GI/Hepatic negative GI ROS, Neg liver ROS,   Endo/Other  Morbid obesity  Renal/GU negative Renal ROS  negative genitourinary   Musculoskeletal negative musculoskeletal ROS (+)   Abdominal (+) + obese,   Peds  Hematology negative hematology ROS (+)   Anesthesia Other Findings   Reproductive/Obstetrics (+) Pregnancy                           Anesthesia Physical Anesthesia Plan  ASA: III  Anesthesia Plan: Epidural   Post-op Pain Management:    Induction:   Airway Management Planned:   Additional Equipment:   Intra-op Plan:   Post-operative Plan:   Informed Consent: I have reviewed the patients History and Physical, chart, labs and discussed the procedure including the risks, benefits and alternatives for the proposed anesthesia with the patient or authorized representative who has indicated his/her understanding and acceptance.     Plan Discussed with:   Anesthesia Plan Comments:         Anesthesia Quick Evaluation

## 2013-01-18 ENCOUNTER — Encounter (HOSPITAL_COMMUNITY): Payer: Self-pay | Admitting: *Deleted

## 2013-01-18 LAB — TYPE AND SCREEN: ABO/RH(D): B POS

## 2013-01-18 LAB — RPR: RPR Ser Ql: NONREACTIVE

## 2013-01-18 MED ORDER — DIBUCAINE 1 % RE OINT: 1.0000 "application " | TOPICAL_OINTMENT | RECTAL | Status: DC | PRN

## 2013-01-18 MED ORDER — TETANUS-DIPHTH-ACELL PERTUSSIS 5-2.5-18.5 LF-MCG/0.5 IM SUSP
0.5000 mL | Freq: Once | INTRAMUSCULAR | Status: AC
  Administered 2013-01-19: 0.5 mL via INTRAMUSCULAR

## 2013-01-18 MED ORDER — ONDANSETRON HCL 4 MG/2ML IJ SOLN: 4.0000 mg | INTRAMUSCULAR | Status: DC | PRN

## 2013-01-18 MED ORDER — DIPHENHYDRAMINE HCL 25 MG PO CAPS: 25.0000 mg | ORAL_CAPSULE | Freq: Four times a day (QID) | ORAL | Status: DC | PRN

## 2013-01-18 MED ORDER — LANOLIN HYDROUS EX OINT: TOPICAL_OINTMENT | CUTANEOUS | Status: DC | PRN

## 2013-01-18 MED ORDER — ONDANSETRON HCL 4 MG PO TABS: 4.0000 mg | ORAL_TABLET | ORAL | Status: DC | PRN

## 2013-01-18 MED ORDER — IBUPROFEN 600 MG PO TABS
600.0000 mg | ORAL_TABLET | Freq: Four times a day (QID) | ORAL | Status: DC
  Administered 2013-01-18 – 2013-01-20 (×10): 600 mg via ORAL
  Filled 2013-01-18 (×10): qty 1

## 2013-01-18 MED ORDER — WITCH HAZEL-GLYCERIN EX PADS: 1.0000 "application " | MEDICATED_PAD | CUTANEOUS | Status: DC | PRN

## 2013-01-18 MED ORDER — PRENATAL MULTIVITAMIN CH
1.0000 | ORAL_TABLET | Freq: Every day | ORAL | Status: DC
  Administered 2013-01-18 – 2013-01-20 (×3): 1 via ORAL
  Filled 2013-01-18 (×3): qty 1

## 2013-01-18 MED ORDER — FERROUS SULFATE 325 (65 FE) MG PO TABS
325.0000 mg | ORAL_TABLET | Freq: Two times a day (BID) | ORAL | Status: DC
  Administered 2013-01-18 – 2013-01-20 (×5): 325 mg via ORAL
  Filled 2013-01-18 (×5): qty 1

## 2013-01-18 MED ORDER — SENNOSIDES-DOCUSATE SODIUM 8.6-50 MG PO TABS
2.0000 | ORAL_TABLET | Freq: Every day | ORAL | Status: DC
  Administered 2013-01-18: 2 via ORAL

## 2013-01-18 MED ORDER — ERYTHROMYCIN 5 MG/GM OP OINT
TOPICAL_OINTMENT | OPHTHALMIC | Status: AC
  Filled 2013-01-18: qty 1

## 2013-01-18 MED ORDER — OXYCODONE-ACETAMINOPHEN 5-325 MG PO TABS
1.0000 | ORAL_TABLET | ORAL | Status: DC | PRN
  Administered 2013-01-18 – 2013-01-20 (×3): 1 via ORAL
  Filled 2013-01-18 (×3): qty 1

## 2013-01-18 NOTE — Progress Notes (Signed)
Dr Dareen Piano notified of SVE, FHR, and UC pattern.  Orders given to start pushing with pt when complete and call for delivery.  Dr Dareen Piano will remain in department.

## 2013-01-18 NOTE — H&P (Signed)
Pt is a 22 year old black female, G2P0 at 37 weeks who presents to L&D complaining of labor. On admission pt was 7 cm. PNC was uncomplicated. PMHX: see hollister PE: VSSAF        HEENT-wnl        ABD-gravid        FHTs- reactive IMP/ IUP at 37 weeks in labor PLAN/ admit

## 2013-01-18 NOTE — Anesthesia Postprocedure Evaluation (Signed)
  Anesthesia Post-op Note  Patient: Elizabeth David  Procedure(s) Performed: * No procedures listed *  Patient Location: PACU and Mother/Baby  Anesthesia Type:Epidural  Level of Consciousness: awake  Airway and Oxygen Therapy: Patient Spontanous Breathing  Post-op Pain: mild  Post-op Assessment: Patient's Cardiovascular Status Stable and Respiratory Function Stable  Post-op Vital Signs: stable  Complications: No apparent anesthesia complications

## 2013-01-19 NOTE — Progress Notes (Signed)
PPD#1 Pt without complaints. Lochia-mild. Baby doing well IMP/ stable Plan/ routine care

## 2013-01-20 MED ORDER — IBUPROFEN 600 MG PO TABS: 600.0000 mg | ORAL_TABLET | Freq: Four times a day (QID) | ORAL | Status: DC

## 2013-01-20 NOTE — Discharge Summary (Signed)
Obstetric Discharge Summary Reason for Admission: onset of labor Prenatal Procedures: none Intrapartum Procedures: spontaneous vaginal delivery Postpartum Procedures: none Complications-Operative and Postpartum: none Hemoglobin  Date Value Range Status  01/17/2013 11.1* 12.0 - 15.0 g/dL Final     HCT  Date Value Range Status  01/17/2013 33.3* 36.0 - 46.0 % Final    Physical Exam:  General: alert and cooperative Lochia: appropriate Uterine Fundus: firm DVT Evaluation: No evidence of DVT seen on physical exam.  Discharge Diagnoses: Term Pregnancy-delivered  Discharge Information: Date: 01/20/2013 Activity: pelvic rest Diet: routine Medications: PNV and Ibuprofen Condition: stable Instructions: refer to practice specific booklet Discharge to: home Follow-up Information   Follow up with Levi Aland, MD In 4 weeks.   Contact information:   [address] [place] [address] [phone]       Newborn Data: Live born female  Birth Weight: 6 lb 7.9 oz (2945 g) APGAR: 7, 9  Home with mother.  Philip Aspen 01/20/2013, 10:03 AM

## 2013-01-28 ENCOUNTER — Ambulatory Visit (HOSPITAL_COMMUNITY)
Admit: 2013-01-28 | Discharge: 2013-01-28 | Disposition: A | Discharge status: Home / Self Care | Payer: No Typology Code available for payment source | Attending: Obstetrics and Gynecology | Admitting: Obstetrics and Gynecology

## 2013-01-28 NOTE — Lactation Note (Signed)
Infant Lactation Consultation Outpatient Visit Note  Patient Name: CARNELLA FRYMAN Date of Birth: 07-01-1991 Birth Weight:   Gestational Age at Delivery: Gestational Age: <None> Type of Delivery:   Breastfeeding History Frequency of Breastfeeding: Tyler feeds q 2 hours- either from the breast or pumped milk from a bottle. Mostly giving bottles of EBM. Joselyn Glassman has had no formula Length of Feeding:  Voids: QS Stools: QS  Supplementing / Method: Pumping:  Type of Pump: manual- plans to call [facility] about a electric pump   Frequency:  Volume:  2 oz  Comments: Joselyn Glassman takes about 2 oz EBM from the bottle    Consultation Evaluation:  Initial Feeding Assessment: Pre-feed Weight: 6-8.1  2950g Post-feed Weight:6-8.7 2970 Amount Transferred:20 cc's Comments: Tyler latched and nursed for 10 minutes then off to sleep . Mom reports that she has been nursing better.   Additional Feeding Assessment: Pre-feed Weight:6-8.7  2970g Post-feed Weight: 6-9.1  2980g Amount Transferred: 10 Comments: Joselyn Glassman latched after a couple of tries. Reviewed basic teaching with mom- wide open mouth and keeping the baby close to the breast throughout the feeding. Tyler nursed for about 15 minutes then off to sleep. Few swallows noted. Reviewed awakening techniques to keep baby stimulated to continue nursing. Mom plans to go home and pump and bottle feed EBM. Does not want to pump here- states she wants to go home and do it. Mom continues with comfort gels because "sometimes Joselyn Glassman does not latch on right" New set of comfort gels given to mom. Encouraged to call [facility] for DEBP to save time. No questions at present. Encouraged to call prn for questions/concerns.     Total Breast milk Transferred this Visit: 30 Total Supplement Given:   Additional Interventions:   Follow-Up  To call prn F/U with ped    Pamelia Hoit 01/28/2013, 1:01 PM

## 2013-12-11 NOTE — L&D Delivery Note (Signed)
Delivery Note At 3:58 PM a viable and healthy female was delivered via Vaginal, Spontaneous Delivery (Presentation: Right Occiput Anterior).  APGAR: 9,9 ; weight  P.   Placenta status: Intact, Spontaneous.  Cord: 3 vessels with the following complications: Nuchal.    Anesthesia: Epidural  Episiotomy: None Lacerations:  cervical Suture Repair: 3.0 vicryl Est. Blood Loss (mL): 400cc  Mom to postpartum.  Baby to Couplet care / Skin to Skin.  [name], Elizabeth David 12/04/2014, 4:24 PM  B+/RI/Tdap in PNC/Br/ Contra - Nexplanon D/w pt and FOB circumcision for female infant including r/b/a, will proceed at office

## 2014-05-07 ENCOUNTER — Emergency Department (HOSPITAL_COMMUNITY)
Admission: EM | Admit: 2014-05-07 | Discharge: 2014-05-07 | Disposition: A | Discharge status: Home / Self Care | Payer: BC Managed Care – PPO | Attending: Emergency Medicine | Admitting: Emergency Medicine

## 2014-05-07 ENCOUNTER — Encounter (HOSPITAL_COMMUNITY): Payer: Self-pay | Admitting: Emergency Medicine

## 2014-05-07 DIAGNOSIS — Z87891 Personal history of nicotine dependence: Secondary | ICD-10-CM | POA: Insufficient documentation

## 2014-05-07 DIAGNOSIS — Z3201 Encounter for pregnancy test, result positive: Secondary | ICD-10-CM | POA: Insufficient documentation

## 2014-05-07 DIAGNOSIS — N390 Urinary tract infection, site not specified: Secondary | ICD-10-CM | POA: Insufficient documentation

## 2014-05-07 DIAGNOSIS — Z79899 Other long term (current) drug therapy: Secondary | ICD-10-CM | POA: Insufficient documentation

## 2014-05-07 LAB — CBC WITH DIFFERENTIAL/PLATELET
BASOS ABS: 0 10*3/uL (ref 0.0–0.1)
Basophils Relative: 0 % (ref 0–1)
EOS PCT: 0 % (ref 0–5)
Eosinophils Absolute: 0 10*3/uL (ref 0.0–0.7)
HEMATOCRIT: 35.8 % — AB (ref 36.0–46.0)
HEMOGLOBIN: 12.5 g/dL (ref 12.0–15.0)
LYMPHS ABS: 1.9 10*3/uL (ref 0.7–4.0)
LYMPHS PCT: 28 % (ref 12–46)
MCH: 28.2 pg (ref 26.0–34.0)
MCHC: 34.9 g/dL (ref 30.0–36.0)
MCV: 80.6 fL (ref 78.0–100.0)
MONO ABS: 0.5 10*3/uL (ref 0.1–1.0)
Monocytes Relative: 7 % (ref 3–12)
Neutro Abs: 4.4 10*3/uL (ref 1.7–7.7)
Neutrophils Relative %: 65 % (ref 43–77)
Platelets: 310 10*3/uL (ref 150–400)
RBC: 4.44 MIL/uL (ref 3.87–5.11)
RDW: 14.3 % (ref 11.5–15.5)
WBC: 6.9 10*3/uL (ref 4.0–10.5)

## 2014-05-07 LAB — URINE MICROSCOPIC-ADD ON

## 2014-05-07 LAB — URINALYSIS, ROUTINE W REFLEX MICROSCOPIC
Bilirubin Urine: NEGATIVE
Glucose, UA: NEGATIVE mg/dL
Hgb urine dipstick: NEGATIVE
Ketones, ur: NEGATIVE mg/dL
Nitrite: NEGATIVE
PROTEIN: 30 mg/dL — AB
Specific Gravity, Urine: 1.022 (ref 1.005–1.030)
UROBILINOGEN UA: 0.2 mg/dL (ref 0.0–1.0)
pH: 8 (ref 5.0–8.0)

## 2014-05-07 LAB — COMPREHENSIVE METABOLIC PANEL
ALT: 14 U/L (ref 0–35)
AST: 13 U/L (ref 0–37)
Albumin: 3.9 g/dL (ref 3.5–5.2)
Alkaline Phosphatase: 55 U/L (ref 39–117)
BUN: 7 mg/dL (ref 6–23)
CALCIUM: 10.2 mg/dL (ref 8.4–10.5)
CO2: 25 meq/L (ref 19–32)
CREATININE: 0.6 mg/dL (ref 0.50–1.10)
Chloride: 98 mEq/L (ref 96–112)
GLUCOSE: 100 mg/dL — AB (ref 70–99)
Potassium: 3.6 mEq/L — ABNORMAL LOW (ref 3.7–5.3)
Sodium: 137 mEq/L (ref 137–147)
Total Bilirubin: 0.4 mg/dL (ref 0.3–1.2)
Total Protein: 7.5 g/dL (ref 6.0–8.3)

## 2014-05-07 LAB — LIPASE, BLOOD: LIPASE: 22 U/L (ref 11–59)

## 2014-05-07 LAB — POC URINE PREG, ED: PREG TEST UR: POSITIVE — AB

## 2014-05-07 MED ORDER — CEPHALEXIN 250 MG PO CAPS: 250.0000 mg | ORAL_CAPSULE | Freq: Four times a day (QID) | ORAL | Status: DC

## 2014-05-07 MED ORDER — ONDANSETRON 8 MG PO TBDP
8.0000 mg | ORAL_TABLET | Freq: Once | ORAL | Status: AC
  Administered 2014-05-07: 8 mg via ORAL
  Filled 2014-05-07: qty 1

## 2014-05-07 MED ORDER — ONDANSETRON 8 MG PO TBDP: 8.0000 mg | ORAL_TABLET | Freq: Three times a day (TID) | ORAL | Status: DC | PRN

## 2014-05-07 NOTE — ED Provider Notes (Signed)
CSN: [id number]     Arrival date & time 05/07/14  1310 History   First MD Initiated Contact with Patient 05/07/14 1339     Chief Complaint  Patient presents with  . Emesis     (Consider location/radiation/quality/duration/timing/severity/associated sxs/prior Treatment) HPI Elizabeth David is a 23 y.o. female who presents to emergency department complaining of nausea, vomiting, diffuse abdominal cramping for 1 week. States able to keep some fluids down, however eating makes her more nauseated. Pain is mainly in the upper abdomen. Denies any fever or chills. States no urinary symptoms. States her period is late this month, and she may be pregnant, did not take a pregnancy test. States that she also noted some "specks of blood" in emesis the last two days. States she is having multiple episodes of vomiting throughout the day. Denies diarrhea. Denies vaginal discharge or bleeding. DId not take anything for her symptoms. States eating makes her symptoms worse, nothing makes them better.    Past Medical History  Diagnosis Date  . Asthma     as child   Past Surgical History  Procedure Laterality Date  . Tonsillectomy      at 23 years of age   No family history on file. History  Substance Use Topics  . Smoking status: Former Smoker -- 0.00 packs/day for 3 years    Types: Cigarettes    Quit date: 06/16/2012  . Smokeless tobacco: Never Used  . Alcohol Use: No   OB History   Grav Para Term Preterm Abortions TAB SAB Ect Mult Living   2 1 1  0 1   0 0 1     Review of Systems  Constitutional: Negative for fever and chills.  Respiratory: Negative for cough, chest tightness and shortness of breath.   Cardiovascular: Negative for chest pain, palpitations and leg swelling.  Gastrointestinal: Positive for nausea, vomiting and abdominal pain. Negative for diarrhea.  Genitourinary: Negative for dysuria, flank pain, vaginal bleeding, vaginal discharge, vaginal pain and pelvic pain.   Musculoskeletal: Negative for myalgias.  Skin: Negative for rash.  Neurological: Negative for dizziness, weakness and headaches.  All other systems reviewed and are negative.     Allergies  Review of patient's allergies indicates no known allergies.  Home Medications   Prior to Admission medications   Medication Sig Start Date End Date Taking? Authorizing Provider  ibuprofen (ADVIL,MOTRIN) 600 MG tablet Take 1 tablet (600 mg total) by mouth every 6 (six) hours. 01/20/13   Allyn Kenner, DO  Prenatal Vit-Fe Fumarate-FA (PRENATAL MULTIVITAMIN) TABS Take 1 tablet by mouth daily.    Historical Provider, MD   BP 153/73  Pulse 84  Temp(Src) 98.7 F (37.1 C) (Oral)  Resp 16  SpO2 100%  LMP 03/23/2014 Physical Exam  Nursing note and vitals reviewed. Constitutional: She appears well-developed and well-nourished. No distress.  HENT:  Head: Normocephalic.  Mouth/Throat: Oropharynx is clear and moist.  Eyes: Conjunctivae are normal.  Neck: Neck supple.  Cardiovascular: Normal rate, regular rhythm and normal heart sounds.   Pulmonary/Chest: Effort normal and breath sounds normal. No respiratory distress. She has no wheezes. She has no rales.  Abdominal: Soft. Bowel sounds are normal. She exhibits no distension. There is tenderness. There is no rebound and no guarding.  Epigastric tenderness  Musculoskeletal: She exhibits no edema.  Neurological: She is alert.  Skin: Skin is warm and dry.  Psychiatric: She has a normal mood and affect. Her behavior is normal.    ED Course  Procedures (  including critical care time) Labs Review Labs Reviewed  CBC WITH DIFFERENTIAL - Abnormal; Notable for the following:    HCT 35.8 (*)    All other components within normal limits  COMPREHENSIVE METABOLIC PANEL - Abnormal; Notable for the following:    Potassium 3.6 (*)    Glucose, Bld 100 (*)    All other components within normal limits  POC URINE PREG, ED - Abnormal; Notable for the  following:    Preg Test, Ur POSITIVE (*)    All other components within normal limits  LIPASE, BLOOD  URINALYSIS, ROUTINE W REFLEX MICROSCOPIC    Imaging Review No results found.   EKG Interpretation None      MDM   Final diagnoses:  Positive pregnancy test  UTI (lower urinary tract infection)   Pt in ED with nausea, vomiting for 1 week, with "specs of blood." epigastric abdominal pain. The patient has normal vital signs, she is in no distress. Non toxic appearing. Will give zofran. Labs, ua, urine preg    3:47 PM Labs normal. Urine preg positive.  Most likely cause of pt's nausea and vomiting, with possible reflux symptoms. Will start on pepcid. zofran for nausea. Doubt biliary pain, no RUQ tenderness. LFTs and lipase normal.  No vaginal complaints or lower abdominal pain. Doubt Ectopic at this time.  Home with ob/gyn follow up  Filed Vitals:   05/07/14 1319 05/07/14 1520 05/07/14 1554  BP: 153/73 128/67 165/85  Pulse: 84 71 91  Temp: 98.7 F (37.1 C) 98.9 F (37.2 C) 98.7 F (37.1 C)  TempSrc: Oral Oral Oral  Resp: 16 20 18   SpO2: 100% 99% 100%     This is a  Renold Genta, PA-C 05/08/14 1545

## 2014-05-07 NOTE — Discharge Instructions (Signed)
Your urine did show an infection. Take keflex as prescribed until all gone. Take zofran for nausea and vomiting as prescribed. Please follow up with OB/GYN for recheck and further evaluation and treatment.   Urinary Tract Infection Urinary tract infections (UTIs) can develop anywhere along your urinary tract. Your urinary tract is your body's drainage system for removing wastes and extra water. Your urinary tract includes two kidneys, two ureters, a bladder, and a urethra. Your kidneys are a pair of bean-shaped organs. Each kidney is about the size of your fist. They are located below your ribs, one on each side of your spine. CAUSES Infections are caused by microbes, which are microscopic organisms, including fungi, viruses, and bacteria. These organisms are so small that they can only be seen through a microscope. Bacteria are the microbes that most commonly cause UTIs. SYMPTOMS  Symptoms of UTIs may vary by age and gender of the patient and by the location of the infection. Symptoms in young women typically include a frequent and intense urge to urinate and a painful, burning feeling in the bladder or urethra during urination. Older women and men are more likely to be tired, shaky, and weak and have muscle aches and abdominal pain. A fever may mean the infection is in your kidneys. Other symptoms of a kidney infection include pain in your back or sides below the ribs, nausea, and vomiting. DIAGNOSIS To diagnose a UTI, your caregiver will ask you about your symptoms. Your caregiver also will ask to provide a urine sample. The urine sample will be tested for bacteria and white blood cells. White blood cells are made by your body to help fight infection. TREATMENT  Typically, UTIs can be treated with medication. Because most UTIs are caused by a bacterial infection, they usually can be treated with the use of antibiotics. The choice of antibiotic and length of treatment depend on your symptoms and the  type of bacteria causing your infection. HOME CARE INSTRUCTIONS  If you were prescribed antibiotics, take them exactly as your caregiver instructs you. Finish the medication even if you feel better after you have only taken some of the medication.  Drink enough water and fluids to keep your urine clear or pale yellow.  Avoid caffeine, tea, and carbonated beverages. They tend to irritate your bladder.  Empty your bladder often. Avoid holding urine for long periods of time.  Empty your bladder before and after sexual intercourse.  After a bowel movement, women should cleanse from front to back. Use each tissue only once. SEEK MEDICAL CARE IF:   You have back pain.  You develop a fever.  Your symptoms do not begin to resolve within 3 days. SEEK IMMEDIATE MEDICAL CARE IF:   You have severe back pain or lower abdominal pain.  You develop chills.  You have nausea or vomiting.  You have continued burning or discomfort with urination. MAKE SURE YOU:   Understand these instructions.  Will watch your condition.  Will get help right away if you are not doing well or get worse. Document Released: 09/06/2005 Document Revised: 05/28/2012 Document Reviewed: 01/05/2012 [facility] Patient Information 2014 [place].

## 2014-05-07 NOTE — ED Notes (Signed)
Per pt, states vomiting for a week-states small amount of blood 2 days ago

## 2014-05-07 NOTE — ED Notes (Signed)
Patient called x 1 no response

## 2014-05-15 NOTE — ED Provider Notes (Signed)
Medical screening examination/treatment/procedure(s) were performed by non-physician practitioner and as supervising physician I was immediately available for consultation/collaboration.   EKG Interpretation None      Rolland Porter, MD, Abram Sander   Janice Norrie, MD 05/15/14 [phone]

## 2014-06-06 ENCOUNTER — Encounter (HOSPITAL_COMMUNITY): Payer: Self-pay

## 2014-06-06 ENCOUNTER — Inpatient Hospital Stay (HOSPITAL_COMMUNITY)
Admission: AD | Admit: 2014-06-06 | Discharge: 2014-06-06 | Disposition: A | Discharge status: Home / Self Care | Payer: BC Managed Care – PPO | Source: Ambulatory Visit | Attending: Obstetrics & Gynecology | Admitting: Obstetrics & Gynecology

## 2014-06-06 DIAGNOSIS — A499 Bacterial infection, unspecified: Secondary | ICD-10-CM

## 2014-06-06 DIAGNOSIS — B9689 Other specified bacterial agents as the cause of diseases classified elsewhere: Secondary | ICD-10-CM | POA: Insufficient documentation

## 2014-06-06 DIAGNOSIS — R109 Unspecified abdominal pain: Secondary | ICD-10-CM | POA: Insufficient documentation

## 2014-06-06 DIAGNOSIS — O2390 Unspecified genitourinary tract infection in pregnancy, unspecified trimester: Secondary | ICD-10-CM | POA: Insufficient documentation

## 2014-06-06 DIAGNOSIS — Z87891 Personal history of nicotine dependence: Secondary | ICD-10-CM | POA: Insufficient documentation

## 2014-06-06 DIAGNOSIS — N760 Acute vaginitis: Secondary | ICD-10-CM | POA: Insufficient documentation

## 2014-06-06 LAB — URINALYSIS, ROUTINE W REFLEX MICROSCOPIC
Bilirubin Urine: NEGATIVE
Glucose, UA: NEGATIVE mg/dL
Ketones, ur: NEGATIVE mg/dL
NITRITE: NEGATIVE
PROTEIN: NEGATIVE mg/dL
SPECIFIC GRAVITY, URINE: 1.025 (ref 1.005–1.030)
Urobilinogen, UA: 0.2 mg/dL (ref 0.0–1.0)
pH: 6 (ref 5.0–8.0)

## 2014-06-06 LAB — WET PREP, GENITAL
TRICH WET PREP: NONE SEEN
YEAST WET PREP: NONE SEEN

## 2014-06-06 LAB — URINE MICROSCOPIC-ADD ON

## 2014-06-06 MED ORDER — METRONIDAZOLE 0.75 % VA GEL: 1.0000 | Freq: Every day | VAGINAL | Status: DC

## 2014-06-06 NOTE — MAU Provider Note (Signed)

## 2014-06-06 NOTE — Discharge Instructions (Signed)
Pelvic rest for now. No intercourse, no douching, no tampons. Drink at least 8 8-oz glasses of water every day. Take Tylenol 325 mg 2 tablets by mouth every 4 hours if needed for pain. Your urine is being tested further and we may call you to start antibiotics.

## 2014-06-06 NOTE — MAU Note (Signed)
Pt states LMP-03/23/2014, here for lower abdominal cramping that began last pm and spotting that began this am.

## 2014-06-06 NOTE — MAU Provider Note (Signed)
History     CSN: [id number]  Arrival date and time: 06/06/14 4801   First Rowe Warman Initiated Contact with Patient 06/06/14 1002      Chief Complaint  Patient presents with  . Abdominal Cramping  . Vaginal Bleeding   HPI Elizabeth David 23 y.o. [redacted]w[redacted]d  Came to MAU today as she had lower abdominal cramping last night and then had spotting when she wiped this morning.  Has not had intercourse this week.  Was worried about the cramping.  FHT heard in triage.    OB History   Grav Para Term Preterm Abortions TAB SAB Ect Mult Living   3 1 1  0 1  1 0 0 1      Past Medical History  Diagnosis Date  . Asthma     as child    Past Surgical History  Procedure Laterality Date  . Tonsillectomy      at 23 years of age    History reviewed. No pertinent family history.  History  Substance Use Topics  . Smoking status: Former Smoker -- 0.00 packs/day for 3 years    Types: Cigarettes    Quit date: 06/16/2012  . Smokeless tobacco: Never Used  . Alcohol Use: No    Allergies: No Known Allergies  Prescriptions prior to admission  Medication Sig Dispense Refill  . Prenatal Vit-Fe Fumarate-FA (PRENATAL MULTIVITAMIN) TABS tablet Take 1 tablet by mouth daily at 12 noon.        Review of Systems  Constitutional: Negative for fever.  Gastrointestinal: Positive for abdominal pain. Negative for nausea, vomiting, diarrhea and constipation.  Genitourinary:       No vaginal discharge. Vaginal bleeding. No dysuria.   Physical Exam   Blood pressure 132/59, pulse 91, temperature 98.3 F (36.8 C), temperature source Oral, resp. rate 16, height 5\' 7"  (1.702 m), weight 212 lb 5 oz (96.304 kg), last menstrual period 03/23/2014, not currently breastfeeding.  Physical Exam  Nursing note and vitals reviewed. Constitutional: She is oriented to person, place, and time. She appears well-developed and well-nourished.  HENT:  Head: Normocephalic.  Eyes: EOM are normal.  Neck: Neck supple.  GI:  Soft. There is no tenderness.  FHT 167 by doppler in triage.  Genitourinary:  Speculum exam: Vagina - Small amount of creamy discharge, no odor, no blood seen Cervix - No active bleeding and no contact bleeding Bimanual exam: Cervix closed Uterus non tender, 10 week size Adnexa non tender, no masses bilaterally GC/Chlam, wet prep done Chaperone present for exam.  Musculoskeletal: Normal range of motion.  Neurological: She is alert and oriented to person, place, and time.  Skin: Skin is warm and dry.  Psychiatric: She has a normal mood and affect.    MAU Course  Procedures Results for orders placed during the hospital encounter of 06/06/14 (from the past 24 hour(s))  URINALYSIS, ROUTINE W REFLEX MICROSCOPIC     Status: Abnormal   Collection Time    06/06/14  9:34 AM      Result Value Ref Range   Color, Urine YELLOW  YELLOW   APPearance CLEAR  CLEAR   Specific Gravity, Urine 1.025  1.005 - 1.030   pH 6.0  5.0 - 8.0   Glucose, UA NEGATIVE  NEGATIVE mg/dL   Hgb urine dipstick LARGE (*) NEGATIVE   Bilirubin Urine NEGATIVE  NEGATIVE   Ketones, ur NEGATIVE  NEGATIVE mg/dL   Protein, ur NEGATIVE  NEGATIVE mg/dL   Urobilinogen, UA 0.2  0.0 -  1.0 mg/dL   Nitrite NEGATIVE  NEGATIVE   Leukocytes, UA MODERATE (*) NEGATIVE  URINE MICROSCOPIC-ADD ON     Status: Abnormal   Collection Time    06/06/14  9:34 AM      Result Value Ref Range   Squamous Epithelial / LPF MANY (*) RARE   WBC, UA 11-20  <3 WBC/hpf   RBC / HPF 3-6  <3 RBC/hpf   Bacteria, UA RARE  RARE   Urine-Other MUCOUS PRESENT    WET PREP, GENITAL     Status: Abnormal   Collection Time    06/06/14 10:15 AM      Result Value Ref Range   Yeast Wet Prep HPF POC NONE SEEN  NONE SEEN   Trich, Wet Prep NONE SEEN  NONE SEEN   Clue Cells Wet Prep HPF POC MANY (*) NONE SEEN   WBC, Wet Prep HPF POC MANY (*) NONE SEEN    MDM Client has contacted [facility] and is waiting to be called for an appointment.  Cramping is  less today than last night.    Assessment and Plan  Pregnancy, first trimester Bacterial vaginosis  Plan Urine culture pending - may have UTI and if so will need keflex Gc/Chlam pending. Will eprescribe metrogel vaginal cream to treat BV. Advised pelvic rest for now. Drink at least 8 8-oz glasses of water every day. Take Tylenol 325 mg 2 tablets by mouth every 4 hours if needed for pain.   BURLESON,TERRI 06/06/2014, 10:20 AM

## 2014-06-07 LAB — URINE CULTURE

## 2014-06-08 LAB — GC/CHLAMYDIA PROBE AMP
CT Probe RNA: NEGATIVE
GC Probe RNA: NEGATIVE

## 2014-07-02 ENCOUNTER — Encounter (HOSPITAL_COMMUNITY): Payer: Self-pay | Admitting: Emergency Medicine

## 2014-07-02 ENCOUNTER — Emergency Department (HOSPITAL_COMMUNITY)
Admission: EM | Admit: 2014-07-02 | Discharge: 2014-07-02 | Disposition: A | Discharge status: Home / Self Care | Payer: BC Managed Care – PPO | Attending: Emergency Medicine | Admitting: Emergency Medicine

## 2014-07-02 DIAGNOSIS — K006 Disturbances in tooth eruption: Secondary | ICD-10-CM | POA: Insufficient documentation

## 2014-07-02 DIAGNOSIS — K011 Impacted teeth: Secondary | ICD-10-CM

## 2014-07-02 DIAGNOSIS — J45909 Unspecified asthma, uncomplicated: Secondary | ICD-10-CM | POA: Diagnosis not present

## 2014-07-02 DIAGNOSIS — O9989 Other specified diseases and conditions complicating pregnancy, childbirth and the puerperium: Secondary | ICD-10-CM | POA: Diagnosis present

## 2014-07-02 DIAGNOSIS — Z87891 Personal history of nicotine dependence: Secondary | ICD-10-CM | POA: Diagnosis not present

## 2014-07-02 NOTE — ED Notes (Signed)
Pt a+ox4, reports L sided dental pain x1 year, seen by dentist at that time and told she needed her wisdom teeth pulled but was unable to 2/2 insurance/pay issues.  Pt reports worsening of pain over the last month.  Denies fevers.  Mild swelling noted.  Pt reports [redacted] weeks pregnant at this time.  Skin PWD.  MAEI.  NAD.

## 2014-07-02 NOTE — Discharge Instructions (Signed)
Impacted Molar Molars are the teeth in the back of your mouth. You have 12 molars. There are 6 molars in each jaw, 3 on each side. When they grow in (erupt) they sometimes cause problems. Molars trapped inside the gum are impacted molars. Impacted molars may grow sideways, tilted, or may only partially emerge. Molars erupt at different times in life. The first set of molars usually erupts around 74 to 23 years of age. The second set of molars typically erupts around 83 to 23 years of age. The third set of molars are called wisdom teeth. These molars usually do not have enough space to erupt properly. Many teens and young adults develop impacted wisdom teeth and have them surgically removed (extracted). However, any molar or set of molars may become impacted. CAUSES  Teeth that are crowded are often the reason for an impacted molar, but sometimes a cyst or tumor may cause impaction of molars. SYMPTOMS  Sometimes there are no symptoms and an impacted molar is noticed during an exam or X-ray. If there are symptoms they may include:  Pain.  Swelling, redness, or inflammation near the impacted tooth or teeth.  Stiff jaw.  General feeling of illness.  Bad breath.  Gap between the teeth.  Difficulty opening your mouth.  Headache or jaw ache.  Swollen lymph nodes. Impacted teeth may increase the risk of complications such as:  Infection, with possible drainage around the infected area.  Damage to nearby teeth.  Growth of cysts.  Chronic discomfort. DIAGNOSIS  Impacted molars are diagnosed by oral exam and X-rays. TREATMENT  The goal of treatment is to obtain the best possible arrangement of your teeth. Your dentist or orthodontist will recommend the best course of action for you. After an exam, your caregiver may recommend one or a combination of the following treatments.  Supportive home care to manage pain and other symptoms until treatment can be started.  Surgical extraction of  one or a combination of molars to leave room for emerging or later molars. Teeth must be extracted at appropriate times for the best results.  Surgical uncovering of tissue covering the impacted molar.  Orthodontic repositioning with the use of appliances such as elastic or metal separators, braces, wires, springs, and other removable or fixed devices. This is done to guide the molar and surrounding teeth to grow in properly. In some cases, you may need some surgery to assist this procedure. Follow-up orthodontic treatment is often necessary with impacted first and second molars.  Antibiotics to treat infection. HOME CARE INSTRUCTIONS Rinse as directed with an antibacterial solution or salt and warm water. Follow up with your caregiver as directed, even if you do not have symptoms. If you are waiting for treatment and have pain:  Take pain medicines as directed.  Take your antibiotics as directed. Finish them even if you start to feel better.  Put ice on the affected area.  Put ice in a plastic bag.  Place a towel between your skin and the bag.  Leave the ice on for 15-20 minutes, 03-04 times a day. SEEK DENTAL CARE IF:  You have a fever.  Pain emerges, worsens, or is not controlled by the medicines you were given.  Swelling occurs.  You have difficulty opening your mouth or swallowing. MAKE SURE YOU:   Understand these instructions.  Will watch your condition.  Will get help right away if you are not doing well or get worse. Document Released: 07/26/2011 Document Revised: 02/19/2012 Document Reviewed:  07/26/2011 [employer] Patient Information 2015 [place], [place]. This information is not intended to replace advice given to you by your health care provider. Make sure you discuss any questions you have with your health care provider. Please call Dr. Buelah Manis,  oral surgery office first thing in the morning and tell them you are being referred to the emergency department.  We will  make every effort to see, you in a timely manner

## 2014-07-02 NOTE — ED Provider Notes (Signed)
CSN: [id number]     Arrival date & time 07/02/14  2026 History  This chart was scribed for Elizabeth Creamer, NP, working with Leota Jacobsen, MD by Steva Colder, ED Scribe. The patient was seen in room WTR5/WTR5 at 9:26 PM.     Chief Complaint  Patient presents with  . Dental Pain     The history is provided by the patient. No language interpreter was used.   HPI Comments: Elizabeth David is a 22 y.o. female who presents to the Emergency Department complaining of worsening left sided dental pain onset 1 year. She states that she was seen by a dentist at [facility] [address] at that time was informed that she needed her wisdom teeth pulled but was unable to because of insurance. She states that she was referred to an oral surgeon. She denies fevers and any other associated symptoms. She states that she is [redacted] weeks pregnant at this time.   Past Medical History  Diagnosis Date  . Asthma     as child   Past Surgical History  Procedure Laterality Date  . Tonsillectomy      at 23 years of age   No family history on file. History  Substance Use Topics  . Smoking status: Former Smoker -- 0.00 packs/day for 3 years    Types: Cigarettes    Quit date: 06/16/2012  . Smokeless tobacco: Never Used  . Alcohol Use: No   OB History   Grav Para Term Preterm Abortions TAB SAB Ect Mult Living   3 1 1  0 1  1 0 0 1     Review of Systems  Constitutional: Negative for fever and chills.  HENT: Positive for dental problem. Negative for ear pain and facial swelling.   All other systems reviewed and are negative.   Allergies  Review of patient's allergies indicates no known allergies.  Home Medications   Prior to Admission medications   Not on File   BP 134/63  Pulse 87  Temp(Src) 98.5 F (36.9 C) (Oral)  Resp 14  SpO2 100%  LMP 03/23/2014  Physical Exam  Nursing note and vitals reviewed. Constitutional: She is oriented to person, place, and time. She appears well-developed and  well-nourished. No distress.  HENT:  Head: Normocephalic and atraumatic.  Mouth/Throat: No dental abscesses.  Posterior gum swelling to the and over the third molar left side upper and lower with evidence of irritating it with chewing. No abscess present at this time.   Eyes: EOM are normal.  Neck: Neck supple. No tracheal deviation present.  Cardiovascular: Normal rate.   Pulmonary/Chest: Effort normal. No respiratory distress.  Musculoskeletal: Normal range of motion.  Neurological: She is alert and oriented to person, place, and time.  Skin: Skin is warm and dry.  Psychiatric: She has a normal mood and affect. Her behavior is normal.    ED Course  Procedures (including critical care time) DIAGNOSTIC STUDIES: Oxygen Saturation is 100% on room air, normal by my interpretation.    COORDINATION OF CARE: 9:29 PM-Discussed treatment plan which includes Tylenol and Referral to a Dentist with pt at bedside and pt agreed to plan.   Labs Review Labs Reviewed - No data to display  Imaging Review No results found.   EKG Interpretation None      MDM  Due to patient, pregnancy.  She's been advised to continue using Tylenol, ice as needed.  Today.  Referral to oral surgery Final diagnoses:  Impacted third  molar tooth       I personally performed the services described in this documentation, which was scribed in my presence. The recorded information has been reviewed and is accurate.    Garald Balding, NP 07/02/14 2138

## 2014-07-03 NOTE — ED Provider Notes (Signed)
Medical screening examination/treatment/procedure(s) were performed by non-physician practitioner and as supervising physician I was immediately available for consultation/collaboration.   EKG Interpretation None       Leota Jacobsen, MD 07/03/14 1310

## 2014-07-10 LAB — OB RESULTS CONSOLE GC/CHLAMYDIA
CHLAMYDIA, DNA PROBE: NEGATIVE
Gonorrhea: NEGATIVE

## 2014-07-15 LAB — OB RESULTS CONSOLE RPR: RPR: NONREACTIVE

## 2014-10-09 LAB — OB RESULTS CONSOLE HIV ANTIBODY (ROUTINE TESTING): HIV: NONREACTIVE

## 2014-10-12 ENCOUNTER — Encounter (HOSPITAL_COMMUNITY): Payer: Self-pay | Admitting: Emergency Medicine

## 2014-11-26 ENCOUNTER — Inpatient Hospital Stay (HOSPITAL_COMMUNITY)
Admission: AD | Admit: 2014-11-26 | Payer: BC Managed Care – PPO | Source: Ambulatory Visit | Admitting: Obstetrics and Gynecology

## 2014-11-29 ENCOUNTER — Inpatient Hospital Stay (HOSPITAL_COMMUNITY)
Admission: AD | Admit: 2014-11-29 | Discharge: 2014-11-29 | Disposition: A | Discharge status: Home / Self Care | Payer: Medicaid Other | Source: Ambulatory Visit | Attending: Obstetrics and Gynecology | Admitting: Obstetrics and Gynecology

## 2014-11-29 ENCOUNTER — Encounter (HOSPITAL_COMMUNITY): Payer: Self-pay

## 2014-11-29 DIAGNOSIS — R109 Unspecified abdominal pain: Secondary | ICD-10-CM | POA: Diagnosis present

## 2014-11-29 DIAGNOSIS — O2343 Unspecified infection of urinary tract in pregnancy, third trimester: Secondary | ICD-10-CM | POA: Insufficient documentation

## 2014-11-29 DIAGNOSIS — Z3A35 35 weeks gestation of pregnancy: Secondary | ICD-10-CM | POA: Diagnosis not present

## 2014-11-29 DIAGNOSIS — Z87891 Personal history of nicotine dependence: Secondary | ICD-10-CM | POA: Diagnosis not present

## 2014-11-29 HISTORY — DX: Anemia, unspecified: D64.9

## 2014-11-29 LAB — URINE MICROSCOPIC-ADD ON

## 2014-11-29 LAB — URINALYSIS, ROUTINE W REFLEX MICROSCOPIC
Bilirubin Urine: NEGATIVE
GLUCOSE, UA: NEGATIVE mg/dL
Hgb urine dipstick: NEGATIVE
Ketones, ur: NEGATIVE mg/dL
Nitrite: NEGATIVE
PH: 7.5 (ref 5.0–8.0)
Protein, ur: NEGATIVE mg/dL
SPECIFIC GRAVITY, URINE: 1.025 (ref 1.005–1.030)
UROBILINOGEN UA: 0.2 mg/dL (ref 0.0–1.0)

## 2014-11-29 MED ORDER — NITROFURANTOIN MONOHYD MACRO 100 MG PO CAPS: 100.0000 mg | ORAL_CAPSULE | Freq: Two times a day (BID) | ORAL | Status: DC

## 2014-11-29 NOTE — MAU Note (Signed)
Pt states here for eval of sharp lower abdominal pain bilaterally noted since Friday. Denies bleeding or abnormal vaginal discharge. Denies uti s/s.

## 2014-11-29 NOTE — Discharge Instructions (Signed)
Pregnancy and Urinary Tract Infection °A urinary tract infection (UTI) is a bacterial infection of the urinary tract. Infection of the urinary tract can include the ureters, kidneys (pyelonephritis), bladder (cystitis), and urethra (urethritis). All pregnant women should be screened for bacteria in the urinary tract. Identifying and treating a UTI will decrease the risk of preterm labor and developing more serious infections in both the mother and baby. °CAUSES °Bacteria germs cause almost all UTIs.  °RISK FACTORS °Many factors can increase your chances of getting a UTI during pregnancy. These include: °· Having a short urethra. °· Poor toilet and hygiene habits. °· Sexual intercourse. °· Blockage of urine along the urinary tract. °· Problems with the pelvic muscles or nerves. °· Diabetes. °· Obesity. °· Bladder problems after having several children. °· Previous history of UTI. °SIGNS AND SYMPTOMS  °· Pain, burning, or a stinging feeling when urinating. °· Suddenly feeling the need to urinate right away (urgency). °· Loss of bladder control (urinary incontinence). °· Frequent urination, more than is common with pregnancy. °· Lower abdominal or back discomfort. °· Cloudy urine. °· Blood in the urine (hematuria). °· Fever.  °When the kidneys are infected, the symptoms may be: °· Back pain. °· Flank pain on the right side more so than the left. °· Fever. °· Chills. °· Nausea. °· Vomiting. °DIAGNOSIS  °A urinary tract infection is usually diagnosed through urine tests. Additional tests and procedures are sometimes done. These may include: °· Ultrasound exam of the kidneys, ureters, bladder, and urethra. °· Looking in the bladder with a lighted tube (cystoscopy). °TREATMENT °Typically, UTIs can be treated with antibiotic medicines.  °HOME CARE INSTRUCTIONS  °· Only take over-the-counter or prescription medicines as directed by your health care provider. If you were prescribed antibiotics, take them as directed. Finish  them even if you start to feel better. °· Drink enough fluids to keep your urine clear or pale yellow. °· Do not have sexual intercourse until the infection is gone and your health care provider says it is okay. °· Make sure you are tested for UTIs throughout your pregnancy. These infections often come back.  °Preventing a UTI in the Future °· Practice good toilet habits. Always wipe from front to back. Use the tissue only once. °· Do not hold your urine. Empty your bladder as soon as possible when the urge comes. °· Do not douche or use deodorant sprays. °· Wash with soap and warm water around the genital area and the anus. °· Empty your bladder before and after sexual intercourse. °· Wear underwear with a cotton crotch. °· Avoid caffeine and carbonated drinks. They can irritate the bladder. °· Drink cranberry juice or take cranberry pills. This may decrease the risk of getting a UTI. °· Do not drink alcohol. °· Keep all your appointments and tests as scheduled.  °SEEK MEDICAL CARE IF:  °· Your symptoms get worse. °· You are still having fevers 2 or more days after treatment begins. °· You have a rash. °· You feel that you are having problems with medicines prescribed. °· You have abnormal vaginal discharge. °SEEK IMMEDIATE MEDICAL CARE IF:  °· You have back or flank pain. °· You have chills. °· You have blood in your urine. °· You have nausea and vomiting. °· You have contractions of your uterus. °· You have a gush of fluid from the vagina. °MAKE SURE YOU: °· Understand these instructions.   °· Will watch your condition.   °· Will get help right away if you are not doing   well or get worse.   °Document Released: 03/24/2011 Document Revised: 09/17/2013 Document Reviewed: 06/26/2013 °ExitCare® Patient Information ©2015 [employer]. This information is not intended to replace advice given to you by your health care provider. Make sure you discuss any questions you have with your health care provider. ° °

## 2014-11-29 NOTE — MAU Provider Note (Signed)
  History     CSN: [id number]  Arrival date and time: 11/29/14 1124   First Provider Initiated Contact with Patient 11/29/14 1240      Chief Complaint  Patient presents with  . Abdominal Pain   HPI Elizabeth David 23 y.o. [redacted]w[redacted]d   Comes to MAU with lower abdominal pain - sharp in lower midline abdomen.  She was concerned as her last baby was born at 21 weeks.  She has not noticed any vaginal bleeding, LOF, dysuria, fever, back pain.  OB History    Gravida Para Term Preterm AB TAB SAB Ectopic Multiple Living   3 1 1  0 1  1 0 0 1      Past Medical History  Diagnosis Date  . Asthma     as child  . Anemia     Past Surgical History  Procedure Laterality Date  . Tonsillectomy      at 23 years of age    History reviewed. No pertinent family history.  History  Substance Use Topics  . Smoking status: Former Smoker -- 0.00 packs/day for 3 years    Types: Cigarettes    Quit date: 06/16/2012  . Smokeless tobacco: Never Used  . Alcohol Use: No    Allergies: No Known Allergies  Prescriptions prior to admission  Medication Sig Dispense Refill Last Dose  . ferrous sulfate 325 (65 FE) MG tablet Take 325 mg by mouth 2 (two) times daily with a meal.   11/28/2014 at Unknown time  . Prenatal Vit-Fe Fumarate-FA (PRENATAL MULTIVITAMIN) TABS tablet Take 1 tablet by mouth daily at 12 noon.   11/28/2014 at Unknown time    ROS Pertinent ROS in HPI Physical Exam   Blood pressure 145/71, pulse 97, temperature 99.2 F (37.3 C), temperature source Oral, resp. rate 16, height 5\' 7"  (1.702 m), weight 230 lb (104.327 kg), last menstrual period 03/23/2014, SpO2 97 %, not currently breastfeeding.  Physical Exam  Constitutional: She is oriented to person, place, and time. She appears well-developed and well-nourished.  HENT:  Head: Normocephalic and atraumatic.  Eyes: EOM are normal.  Neck: Normal range of motion.  Cardiovascular: Normal rate.   Respiratory: Effort normal. No respiratory  distress.  GI: Soft. She exhibits no distension. There is no tenderness.  Genitourinary: Vagina normal.  Cervix is soft, posterior, closed  Musculoskeletal: Normal range of motion.  Neurological: She is alert and oriented to person, place, and time.  Skin: Skin is warm and dry.  Psychiatric: She has a normal mood and affect.   Fetal Tracing:  Baseline:140 Variability:mod Accelerations: 15x15 x 2 Decelerations:none  Toco:none  Blood pressure readings repeated x 5 all within normal range  MAU Course  Procedures  MDM 1300 care assumed by Allie Dimmer, PA Discussed with Dr. Melba Coon.  Okay to treat for presumptive UTI with macrobid and discharge to home with urine cx pending.  Assessment and Plan  A: UTI in pregnancy  P: Discharge to home Macrobid Increase fluids Labor precautions Keep clinic visit in 2 days Patient may return to MAU as needed or if her condition were to change or worsen   BURLESON,TERRI 11/29/2014, 12:54 PM

## 2014-12-01 LAB — URINE CULTURE
Colony Count: >=100000
SPECIAL REQUESTS: NORMAL

## 2014-12-04 ENCOUNTER — Inpatient Hospital Stay (HOSPITAL_COMMUNITY)
Admission: AD | Admit: 2014-12-04 | Discharge: 2014-12-06 | DRG: 775 | Disposition: A | Discharge status: Home / Self Care | Payer: Medicaid Other | Source: Ambulatory Visit | Attending: Obstetrics and Gynecology | Admitting: Obstetrics and Gynecology

## 2014-12-04 ENCOUNTER — Inpatient Hospital Stay (HOSPITAL_COMMUNITY): Payer: Medicaid Other | Admitting: Anesthesiology

## 2014-12-04 ENCOUNTER — Encounter (HOSPITAL_COMMUNITY): Payer: Self-pay | Admitting: Obstetrics and Gynecology

## 2014-12-04 DIAGNOSIS — Z8249 Family history of ischemic heart disease and other diseases of the circulatory system: Secondary | ICD-10-CM

## 2014-12-04 DIAGNOSIS — Z833 Family history of diabetes mellitus: Secondary | ICD-10-CM | POA: Diagnosis not present

## 2014-12-04 DIAGNOSIS — O9952 Diseases of the respiratory system complicating childbirth: Secondary | ICD-10-CM | POA: Diagnosis present

## 2014-12-04 DIAGNOSIS — Z87891 Personal history of nicotine dependence: Secondary | ICD-10-CM

## 2014-12-04 DIAGNOSIS — J45909 Unspecified asthma, uncomplicated: Secondary | ICD-10-CM | POA: Diagnosis present

## 2014-12-04 DIAGNOSIS — IMO0001 Reserved for inherently not codable concepts without codable children: Secondary | ICD-10-CM

## 2014-12-04 DIAGNOSIS — Z3A36 36 weeks gestation of pregnancy: Secondary | ICD-10-CM | POA: Diagnosis present

## 2014-12-04 HISTORY — DX: Reserved for inherently not codable concepts without codable children: IMO0001

## 2014-12-04 LAB — OB RESULTS CONSOLE GBS: GBS: NEGATIVE

## 2014-12-04 LAB — CBC
HEMATOCRIT: 33.3 % — AB (ref 36.0–46.0)
Hemoglobin: 11 g/dL — ABNORMAL LOW (ref 12.0–15.0)
MCH: 26.7 pg (ref 26.0–34.0)
MCHC: 33 g/dL (ref 30.0–36.0)
MCV: 80.8 fL (ref 78.0–100.0)
Platelets: 229 10*3/uL (ref 150–400)
RBC: 4.12 MIL/uL (ref 3.87–5.11)
RDW: 15.7 % — ABNORMAL HIGH (ref 11.5–15.5)
WBC: 8 10*3/uL (ref 4.0–10.5)

## 2014-12-04 MED ORDER — OXYTOCIN 40 UNITS IN LACTATED RINGERS INFUSION - SIMPLE MED: 62.5000 mL/h | INTRAVENOUS | Status: DC

## 2014-12-04 MED ORDER — CITRIC ACID-SODIUM CITRATE 334-500 MG/5ML PO SOLN: 30.0000 mL | ORAL | Status: DC | PRN

## 2014-12-04 MED ORDER — PRENATAL MULTIVITAMIN CH
1.0000 | ORAL_TABLET | Freq: Every day | ORAL | Status: DC
  Administered 2014-12-05 – 2014-12-06 (×2): 1 via ORAL
  Filled 2014-12-04 (×2): qty 1

## 2014-12-04 MED ORDER — DIBUCAINE 1 % RE OINT
1.0000 | TOPICAL_OINTMENT | RECTAL | Status: DC | PRN
Start: 2014-12-04 — End: 2014-12-06

## 2014-12-04 MED ORDER — ZOLPIDEM TARTRATE 5 MG PO TABS: 5.0000 mg | ORAL_TABLET | Freq: Every evening | ORAL | Status: DC | PRN

## 2014-12-04 MED ORDER — DIPHENHYDRAMINE HCL 25 MG PO CAPS: 25.0000 mg | ORAL_CAPSULE | Freq: Four times a day (QID) | ORAL | Status: DC | PRN

## 2014-12-04 MED ORDER — PHENYLEPHRINE 40 MCG/ML (10ML) SYRINGE FOR IV PUSH (FOR BLOOD PRESSURE SUPPORT)
PREFILLED_SYRINGE | INTRAVENOUS | Status: AC
  Filled 2014-12-04: qty 10

## 2014-12-04 MED ORDER — IBUPROFEN 600 MG PO TABS
600.0000 mg | ORAL_TABLET | Freq: Four times a day (QID) | ORAL | Status: DC
  Administered 2014-12-04 – 2014-12-06 (×8): 600 mg via ORAL
  Filled 2014-12-04 (×8): qty 1

## 2014-12-04 MED ORDER — DIPHENHYDRAMINE HCL 50 MG/ML IJ SOLN: 12.5000 mg | INTRAMUSCULAR | Status: DC | PRN

## 2014-12-04 MED ORDER — LACTATED RINGERS IV SOLN: INTRAVENOUS | Status: DC

## 2014-12-04 MED ORDER — LACTATED RINGERS IV SOLN: 500.0000 mL | INTRAVENOUS | Status: DC | PRN

## 2014-12-04 MED ORDER — SENNOSIDES-DOCUSATE SODIUM 8.6-50 MG PO TABS
2.0000 | ORAL_TABLET | ORAL | Status: DC
  Administered 2014-12-05 – 2014-12-06 (×2): 2 via ORAL
  Filled 2014-12-04 (×2): qty 2

## 2014-12-04 MED ORDER — FLEET ENEMA 7-19 GM/118ML RE ENEM: 1.0000 | ENEMA | RECTAL | Status: DC | PRN

## 2014-12-04 MED ORDER — FENTANYL 2.5 MCG/ML BUPIVACAINE 1/10 % EPIDURAL INFUSION (WH - ANES): 14.0000 mL/h | INTRAMUSCULAR | Status: DC | PRN

## 2014-12-04 MED ORDER — BENZOCAINE-MENTHOL 20-0.5 % EX AERO
1.0000 "application " | INHALATION_SPRAY | CUTANEOUS | Status: DC | PRN
  Administered 2014-12-04: 1 via TOPICAL
  Filled 2014-12-04: qty 56

## 2014-12-04 MED ORDER — EPHEDRINE 5 MG/ML INJ
10.0000 mg | INTRAVENOUS | Status: DC | PRN
  Filled 2014-12-04: qty 2

## 2014-12-04 MED ORDER — LIDOCAINE HCL (PF) 1 % IJ SOLN
INTRAMUSCULAR | Status: DC | PRN
  Administered 2014-12-04 (×2): 4 mL

## 2014-12-04 MED ORDER — PHENYLEPHRINE 40 MCG/ML (10ML) SYRINGE FOR IV PUSH (FOR BLOOD PRESSURE SUPPORT)
80.0000 ug | PREFILLED_SYRINGE | INTRAVENOUS | Status: DC | PRN
  Filled 2014-12-04: qty 2

## 2014-12-04 MED ORDER — LACTATED RINGERS IV SOLN
500.0000 mL | Freq: Once | INTRAVENOUS | Status: AC
  Administered 2014-12-04: 500 mL via INTRAVENOUS

## 2014-12-04 MED ORDER — FENTANYL 2.5 MCG/ML BUPIVACAINE 1/10 % EPIDURAL INFUSION (WH - ANES)
INTRAMUSCULAR | Status: DC | PRN
  Administered 2014-12-04: 14 mL/h via EPIDURAL

## 2014-12-04 MED ORDER — ONDANSETRON HCL 4 MG/2ML IJ SOLN: 4.0000 mg | INTRAMUSCULAR | Status: DC | PRN

## 2014-12-04 MED ORDER — BUTORPHANOL TARTRATE 1 MG/ML IJ SOLN: 1.0000 mg | INTRAMUSCULAR | Status: DC | PRN

## 2014-12-04 MED ORDER — LIDOCAINE HCL (PF) 1 % IJ SOLN
30.0000 mL | INTRAMUSCULAR | Status: DC | PRN
Start: 2014-12-04 — End: 2014-12-04
  Filled 2014-12-04: qty 30

## 2014-12-04 MED ORDER — ACETAMINOPHEN 325 MG PO TABS: 650.0000 mg | ORAL_TABLET | ORAL | Status: DC | PRN

## 2014-12-04 MED ORDER — OXYCODONE-ACETAMINOPHEN 5-325 MG PO TABS: 2.0000 | ORAL_TABLET | ORAL | Status: DC | PRN

## 2014-12-04 MED ORDER — LANOLIN HYDROUS EX OINT: TOPICAL_OINTMENT | CUTANEOUS | Status: DC | PRN

## 2014-12-04 MED ORDER — PNEUMOCOCCAL VAC POLYVALENT 25 MCG/0.5ML IJ INJ
0.5000 mL | INJECTION | INTRAMUSCULAR | Status: DC
  Filled 2014-12-04: qty 0.5

## 2014-12-04 MED ORDER — ONDANSETRON HCL 4 MG PO TABS: 4.0000 mg | ORAL_TABLET | ORAL | Status: DC | PRN

## 2014-12-04 MED ORDER — ONDANSETRON HCL 4 MG/2ML IJ SOLN: 4.0000 mg | Freq: Four times a day (QID) | INTRAMUSCULAR | Status: DC | PRN

## 2014-12-04 MED ORDER — WITCH HAZEL-GLYCERIN EX PADS: 1.0000 "application " | MEDICATED_PAD | CUTANEOUS | Status: DC | PRN

## 2014-12-04 MED ORDER — OXYCODONE-ACETAMINOPHEN 5-325 MG PO TABS
1.0000 | ORAL_TABLET | ORAL | Status: DC | PRN
Start: 2014-12-04 — End: 2014-12-04

## 2014-12-04 MED ORDER — SIMETHICONE 80 MG PO CHEW: 80.0000 mg | CHEWABLE_TABLET | ORAL | Status: DC | PRN

## 2014-12-04 MED ORDER — TETANUS-DIPHTH-ACELL PERTUSSIS 5-2.5-18.5 LF-MCG/0.5 IM SUSP: 0.5000 mL | Freq: Once | INTRAMUSCULAR | Status: DC

## 2014-12-04 MED ORDER — OXYCODONE-ACETAMINOPHEN 5-325 MG PO TABS
1.0000 | ORAL_TABLET | ORAL | Status: DC | PRN
  Administered 2014-12-04 – 2014-12-06 (×7): 1 via ORAL
  Filled 2014-12-04 (×7): qty 1

## 2014-12-04 MED ORDER — PHENYLEPHRINE 40 MCG/ML (10ML) SYRINGE FOR IV PUSH (FOR BLOOD PRESSURE SUPPORT)
80.0000 ug | PREFILLED_SYRINGE | INTRAVENOUS | Status: DC | PRN
Start: 2014-12-04 — End: 2014-12-06
  Filled 2014-12-04: qty 2

## 2014-12-04 MED ORDER — LACTATED RINGERS IV SOLN
INTRAVENOUS | Status: DC
  Administered 2014-12-04: 14:00:00 via INTRAVENOUS

## 2014-12-04 MED ORDER — OXYTOCIN BOLUS FROM INFUSION
500.0000 mL | INTRAVENOUS | Status: DC
  Administered 2014-12-04: 500 mL via INTRAVENOUS

## 2014-12-04 MED ORDER — FENTANYL 2.5 MCG/ML BUPIVACAINE 1/10 % EPIDURAL INFUSION (WH - ANES)
INTRAMUSCULAR | Status: AC
  Filled 2014-12-04: qty 125

## 2014-12-04 NOTE — MAU Note (Signed)
Patient states she is having contractions every 4 minutes. Denies bleeding or leaking and reports good fetal movement.

## 2014-12-04 NOTE — Anesthesia Preprocedure Evaluation (Signed)
Anesthesia Evaluation  Patient identified by MRN, date of birth, ID band Patient awake    Reviewed: Allergy & Precautions, H&P , NPO status , Patient's Chart, lab work & pertinent test results  History of Anesthesia Complications Negative for: history of anesthetic complications  Airway Mallampati: III  TM Distance: >3 FB Neck ROM: Full    Dental no notable dental hx. (+) Dental Advisory Given   Pulmonary asthma (childhood asthma) , former smoker,  breath sounds clear to auscultation  Pulmonary exam normal       Cardiovascular negative cardio ROS  Rhythm:Regular Rate:Normal     Neuro/Psych negative neurological ROS  negative psych ROS   GI/Hepatic negative GI ROS, Neg liver ROS,   Endo/Other  negative endocrine ROS  Renal/GU negative Renal ROS  negative genitourinary   Musculoskeletal negative musculoskeletal ROS (+)   Abdominal   Peds negative pediatric ROS (+)  Hematology negative hematology ROS (+)   Anesthesia Other Findings   Reproductive/Obstetrics (+) Pregnancy                             Anesthesia Physical Anesthesia Plan  ASA: II  Anesthesia Plan: Epidural   Post-op Pain Management:    Induction:   Airway Management Planned:   Additional Equipment:   Intra-op Plan:   Post-operative Plan:   Informed Consent: I have reviewed the patients History and Physical, chart, labs and discussed the procedure including the risks, benefits and alternatives for the proposed anesthesia with the patient or authorized representative who has indicated his/her understanding and acceptance.   Dental advisory given  Plan Discussed with:   Anesthesia Plan Comments:         Anesthesia Quick Evaluation

## 2014-12-04 NOTE — Lactation Note (Signed)
This note was copied from the chart of Elizabeth Danyell Shader. Lactation Consultation Note  Baby is sleepy but mom reports he has BF well and is BF better than her older child did. Double electric breast pump was set up by the RN and  Late Preterm handout reviewed with the patient. Hand expression was taught with colostrum easily visible.  I encouraged mom to hand express and spoon feed it back to baby.  I discussed spoon feeding but Baby was skin to skin and asleep so I could not demonstrate.  I asked her to call her nurse prior to spoon feeding. Aware of support group and outpatient services.  Follow-up tomorrow.  Patient Name: Elizabeth David [id number] Date: 12/04/2014 Reason for consult: Initial assessment   Maternal Data Has patient been taught Hand Expression?: Yes Does the patient have breastfeeding experience prior to this delivery?: Yes  Feeding Feeding Type: Breast Fed Length of feed: 10 min  LATCH Score/Interventions                      Lactation Tools Discussed/Used Initiated by:: late pre term Date initiated:: 12/05/14   Consult Status Consult Status: Follow-up Date: 12/05/14 Follow-up type: In-patient    Van Clines 12/04/2014, 11:19 PM

## 2014-12-04 NOTE — H&P (Addendum)
Elizabeth David is a 23 y.o. female g3p1011 at 36+ in active labor, presents to L&D 8/90/0-+1.  Relatively uncomplicated PNC.  +FM, no LOF, VB, ctx increasing in intensity and frequency.  Dated by LMP cw First Trimester [place].   Maternal Medical History:  Reason for admission: Contractions.   Contractions: Frequency: regular.   Perceived severity is strong.    Fetal activity: Perceived fetal activity is normal.    Prenatal Complications - Diabetes: none.    OB History    Gravida Para Term Preterm AB TAB SAB Ectopic Multiple Living   3 1 1  0 1  1 0 0 1    G1 SAB G2 SVD 6#7, female G3 present  No abn pap H/O GC/Chl  Past Medical History  Diagnosis Date  . Asthma     as child  . Anemia   . Active labor 12/04/2014   Past Surgical History  Procedure Laterality Date  . Tonsillectomy      at 23 years of age   Family History:MI, HTN, DM, thyroid dz, seizure, bipolar Social History:  reports that she quit smoking about 2 years ago. Her smoking use included Cigarettes. She smoked 0.00 packs per day for 3 years. She has never used smokeless tobacco. She reports that she does not drink alcohol or use illicit drugs.married Meds PNV All NKDA   Prenatal Transfer Tool  Maternal Diabetes: No Genetic Screening: Declined Maternal Ultrasounds/Referrals: Normal Fetal Ultrasounds or other Referrals:  None Maternal Substance Abuse:  No Significant Maternal Medications:  None Significant Maternal Lab Results:  Lab values include: Group B Strep negative Other Comments:  None  Review of Systems  Constitutional: Negative.   HENT: Negative.   Eyes: Negative.   Respiratory: Negative.   Cardiovascular: Negative.   Gastrointestinal: Negative.   Genitourinary: Negative.   Musculoskeletal: Negative.   Skin: Negative.   Neurological: Negative.   Psychiatric/Behavioral: Negative.     Dilation: 8 Effacement (%): 80 Station: -2 Exam by:: Velna Ochs RN Blood pressure 146/70, pulse 88,  temperature 98.4 F (36.9 C), temperature source Oral, resp. rate 20, height 5\' 7"  (1.702 m), weight 105.96 kg (233 lb 9.6 oz), last menstrual period 03/23/2014, not currently breastfeeding. Maternal Exam:  Uterine Assessment: Contraction strength is firm.  Contraction frequency is regular.   Abdomen: Fundal height is appropriate for gestation.   Estimated fetal weight is 7#.   Fetal presentation: vertex  Introitus: Normal vulva. Normal vagina.  Pelvis: adequate for delivery.   Cervix: Cervix evaluated by digital exam.     Physical Exam  Constitutional: She is oriented to person, place, and time. She appears well-developed and well-nourished.  HENT:  Head: Normocephalic and atraumatic.  Cardiovascular: Normal rate and regular rhythm.   Respiratory: Effort normal and breath sounds normal. No respiratory distress. She has no wheezes.  GI: Soft. Bowel sounds are normal. She exhibits no distension. There is no tenderness.  Musculoskeletal: Normal range of motion.  Neurological: She is alert and oriented to person, place, and time.  Skin: Skin is warm and dry.  Psychiatric: She has a normal mood and affect. Her behavior is normal.    Prenatal labs: ABO, Rh:  B+ Antibody:  neg Rubella:  immune RPR:   NR HBsAg:   neg HIV:   neg GBS:   neg  Hgb 11.0/GC neg/ Chl neg/ Varicella Immune. glucola 108/Hgb electro WNL  Tdap 10/08/14  Assessment/Plan: G3P1011 at 36+ in labor Try for epidural Expect SVD gbbs neg  [name], Tyarra Nolton  12/04/2014, 2:12 PM

## 2014-12-04 NOTE — Anesthesia Procedure Notes (Signed)
Epidural Patient location during procedure: OB Start time: 12/04/2014 2:40 PM End time: 12/04/2014 2:50 PM  Staffing Anesthesiologist: Lauretta Grill JENNETTE Performed by: anesthesiologist   Preanesthetic Checklist Completed: patient identified, site marked, surgical consent, pre-op evaluation, timeout performed, IV checked, risks and benefits discussed and monitors and equipment checked  Epidural Patient position: sitting Prep: site prepped and draped and DuraPrep Patient monitoring: continuous pulse ox and blood pressure Approach: midline Location: L4-L5 Injection technique: LOR saline  Needle:  Needle type: Tuohy  Needle gauge: 17 G Needle length: 9 cm and 9 Needle insertion depth: 7 cm Catheter type: closed end flexible Catheter size: 19 Gauge Catheter at skin depth: 12 cm Test dose: negative  Assessment Events: blood not aspirated, injection not painful, no injection resistance, negative IV test and no paresthesia  Additional Notes Attempted first at L3/4 and encountered os, numbed space below L4/5 and on first attempt was successfulReason for block:procedure for pain

## 2014-12-05 LAB — RPR

## 2014-12-05 LAB — CBC
HEMATOCRIT: 28.8 % — AB (ref 36.0–46.0)
Hemoglobin: 9.6 g/dL — ABNORMAL LOW (ref 12.0–15.0)
MCH: 27.2 pg (ref 26.0–34.0)
MCHC: 33.3 g/dL (ref 30.0–36.0)
MCV: 81.6 fL (ref 78.0–100.0)
Platelets: 181 10*3/uL (ref 150–400)
RBC: 3.53 MIL/uL — AB (ref 3.87–5.11)
RDW: 15.8 % — ABNORMAL HIGH (ref 11.5–15.5)
WBC: 10.1 10*3/uL (ref 4.0–10.5)

## 2014-12-05 MED ORDER — OXYCODONE-ACETAMINOPHEN 5-325 MG PO TABS: 1.0000 | ORAL_TABLET | ORAL | Status: DC | PRN

## 2014-12-05 MED ORDER — IBUPROFEN 600 MG PO TABS
600.0000 mg | ORAL_TABLET | Freq: Four times a day (QID) | ORAL | Status: DC
Start: 2014-12-05 — End: 2017-03-23

## 2014-12-05 NOTE — Progress Notes (Signed)
Post Partum Day 1 Subjective: no complaints, up ad lib and voiding--requests early d/c this pm  Objective: Blood pressure 141/70, pulse 69, temperature 98.6 F (37 C), temperature source Oral, resp. rate 18, height 5\' 7"  (1.702 m), weight 105.96 kg (233 lb 9.6 oz), last menstrual period 03/23/2014, SpO2 100 %, unknown if currently breastfeeding.  Physical Exam:  General: alert and cooperative Lochia: appropriate Uterine Fundus: firm    Recent Labs  12/04/14 1405 12/05/14 0555  HGB 11.0* 9.6*  HCT 33.3* 28.8*    Assessment/Plan: Discharge home if baby able to go   LOS: 1 day   Ronin Rehfeldt W 12/05/2014, 10:24 AM

## 2014-12-05 NOTE — Anesthesia Postprocedure Evaluation (Signed)
  Anesthesia Post-op Note  Anesthesia Post Note  Patient: Elizabeth David  Procedure(s) Performed: * No procedures listed *  Anesthesia type: Epidural  Patient location: Mother/Baby  Post pain: Pain level controlled  Post assessment: Post-op Vital signs reviewed  Last Vitals:  Filed Vitals:   12/05/14 0652  BP: 147/74  Pulse: 67  Temp: 36.8 C  Resp: 18    Post vital signs: Reviewed  Level of consciousness:alert  Complications: No apparent anesthesia complications

## 2014-12-05 NOTE — Discharge Summary (Addendum)
Obstetric Discharge Summary Reason for Admission: onset of labor Prenatal Procedures: none Intrapartum Procedures: spontaneous vaginal delivery Postpartum Procedures: none Complications-Operative and Postpartum: cervical laceration HEMOGLOBIN  Date Value Ref Range Status  12/05/2014 9.6* 12.0 - 15.0 g/dL Final   HCT  Date Value Ref Range Status  12/05/2014 28.8* 36.0 - 46.0 % Final    Physical Exam:  General: alert and cooperative Lochia: appropriate Uterine Fundus: firm   Discharge Diagnoses: Term Pregnancy-delivered  Discharge Information: Date: 12/06/2014 Activity: pelvic rest Diet: routine Medications: Ibuprofen and Percocet Condition: improved Instructions: refer to practice specific booklet Discharge to: home   Newborn Data: Live born female  Birth Weight: 6 lb 3.3 oz (2815 g) APGAR: 9, 9  Home with mother.  Logan Bores 12/05/2014, 10:26 AM   Pt stayed an additional night because baby unable to be d/c since early delivery at 36+weeks.  Doing fine today, ready for d/c.

## 2014-12-06 NOTE — Progress Notes (Signed)
Post Partum Day 2 Subjective: no complaints and tolerating PO, stayed last night because baby had to stay  Objective: Blood pressure 123/57, pulse 71, temperature 98.2 F (36.8 C), temperature source Oral, resp. rate 18, height 5\' 7"  (1.702 m), weight 105.96 kg (233 lb 9.6 oz), last menstrual period 03/23/2014, SpO2 100 %, unknown if currently breastfeeding.  Physical Exam:  General: alert and cooperative Lochia: appropriate Uterine Fundus: firm }   Recent Labs  12/04/14 1405 12/05/14 0555  HGB 11.0* 9.6*  HCT 33.3* 28.8*    Assessment/Plan: Discharge home   LOS: 2 days   Clarinda Obi W 12/06/2014, 9:35 AM

## 2014-12-06 NOTE — Lactation Note (Signed)
This note was copied from the chart of Elizabeth David. Lactation Consultation Note   Follow up consult with this mom and baby, now 53 hours old and 36 6/7 weeks CGA, weighing 5 lbs 14 oz. Mom is putting baby to breast and then bottle feeding, mostly formula for now. She is pumping and her milk is transitioning in. She admits to pump about every 5 hours. I reviewed supply and demand with mom, engorgement and breast care. I also advised mom to call lactation for o/p consul and questions/cocnerns, as needed.   Patient Name: Elizabeth David [id number] Date: 12/06/2014 Reason for consult: Follow-up assessment   Maternal Data    Feeding Feeding Type: Breast Fed  LATCH Score/Interventions Latch: Repeated attempts needed to sustain latch, nipple held in mouth throughout feeding, stimulation needed to elicit sucking reflex.  Audible Swallowing: None Intervention(s): Hand expression;Skin to skin  Type of Nipple: Everted at rest and after stimulation  Comfort (Breast/Nipple): Filling, red/small blisters or bruises, mild/mod discomfort  Problem noted: Filling Interventions (Filling): Reverse pressure;Double electric pump  Hold (Positioning): No assistance needed to correctly position infant at breast.  LATCH Score: 6  Lactation Tools Discussed/Used WIC Program: Yes (mom to call for DEP tomorrow) Pump Review: Setup, frequency, and cleaning   Consult Status Consult Status: Complete Follow-up type: Call as needed    Tonna Corner 12/06/2014, 9:53 AM

## 2014-12-10 NOTE — Progress Notes (Signed)
Post discharge chart review completed.  

## 2015-05-05 ENCOUNTER — Encounter (HOSPITAL_COMMUNITY): Payer: Self-pay | Admitting: Emergency Medicine

## 2015-05-05 ENCOUNTER — Emergency Department (HOSPITAL_COMMUNITY)
Admission: EM | Admit: 2015-05-05 | Discharge: 2015-05-05 | Disposition: A | Discharge status: Home / Self Care | Payer: BLUE CROSS/BLUE SHIELD | Attending: Emergency Medicine | Admitting: Emergency Medicine

## 2015-05-05 DIAGNOSIS — J45909 Unspecified asthma, uncomplicated: Secondary | ICD-10-CM | POA: Diagnosis not present

## 2015-05-05 DIAGNOSIS — Z87891 Personal history of nicotine dependence: Secondary | ICD-10-CM | POA: Insufficient documentation

## 2015-05-05 DIAGNOSIS — D649 Anemia, unspecified: Secondary | ICD-10-CM | POA: Insufficient documentation

## 2015-05-05 DIAGNOSIS — L0231 Cutaneous abscess of buttock: Secondary | ICD-10-CM | POA: Diagnosis not present

## 2015-05-05 DIAGNOSIS — L0291 Cutaneous abscess, unspecified: Secondary | ICD-10-CM

## 2015-05-05 MED ORDER — MORPHINE SULFATE 4 MG/ML IJ SOLN
4.0000 mg | Freq: Once | INTRAMUSCULAR | Status: AC
  Administered 2015-05-05: 4 mg via INTRAMUSCULAR
  Filled 2015-05-05: qty 1

## 2015-05-05 MED ORDER — NAPROXEN 500 MG PO TABS: 500.0000 mg | ORAL_TABLET | Freq: Two times a day (BID) | ORAL | Status: DC

## 2015-05-05 MED ORDER — LIDOCAINE HCL (PF) 1 % IJ SOLN
5.0000 mL | Freq: Once | INTRAMUSCULAR | Status: AC
  Administered 2015-05-05: 5 mL
  Filled 2015-05-05: qty 5

## 2015-05-05 NOTE — ED Notes (Addendum)
Pt A+Ox4, reports "boil" to top of glutteal cleft, since January "started after I had my son", pt reports pain has worsening over the last few days, otherwise denies new complaints.  Skin PWD.  No obvious swelling/redness noted, no drainage.  Tender to touch.  Pt reports difficulty sitting 2/2 pain.  MAEI.  Ambulatory with steady gait.  Speaking full/clear sentences.  NAD.

## 2015-05-05 NOTE — Discharge Instructions (Signed)
Abscess °An abscess is an infected area that contains a collection of pus and debris. It can occur in almost any part of the body. An abscess is also known as a furuncle or boil. °CAUSES  °An abscess occurs when tissue gets infected. This can occur from blockage of oil or sweat glands, infection of hair follicles, or a minor injury to the skin. As the body tries to fight the infection, pus collects in the area and creates pressure under the skin. This pressure causes pain. People with weakened immune systems have difficulty fighting infections and get certain abscesses more often.  °SYMPTOMS °Usually an abscess develops on the skin and becomes a painful mass that is red, warm, and tender. If the abscess forms under the skin, you may feel a moveable soft area under the skin. Some abscesses break open (rupture) on their own, but most will continue to get worse without care. The infection can spread deeper into the body and eventually into the bloodstream, causing you to feel ill.  °DIAGNOSIS  °Your caregiver will take your medical history and perform a physical exam. A sample of fluid may also be taken from the abscess to determine what is causing your infection. °TREATMENT  °Your caregiver may prescribe antibiotic medicines to fight the infection. However, taking antibiotics alone usually does not cure an abscess. Your caregiver may need to make a small cut (incision) in the abscess to drain the pus. In some cases, gauze is packed into the abscess to reduce pain and to continue draining the area. °HOME CARE INSTRUCTIONS  °· Only take over-the-counter or prescription medicines for pain, discomfort, or fever as directed by your caregiver. °· If you were prescribed antibiotics, take them as directed. Finish them even if you start to feel better. °· If gauze is used, follow your caregiver's directions for changing the gauze. °· To avoid spreading the infection: °· Keep your draining abscess covered with a  bandage. °· Wash your hands well. °· Do not share personal care items, towels, or whirlpools with others. °· Avoid skin contact with others. °· Keep your skin and clothes clean around the abscess. °· Keep all follow-up appointments as directed by your caregiver. °SEEK MEDICAL CARE IF:  °· You have increased pain, swelling, redness, fluid drainage, or bleeding. °· You have muscle aches, chills, or a general ill feeling. °· You have a fever. °MAKE SURE YOU:  °· Understand these instructions. °· Will watch your condition. °· Will get help right away if you are not doing well or get worse. °Document Released: 09/06/2005 Document Revised: 05/28/2012 Document Reviewed: 02/09/2012 °ExitCare® Patient Information ©2015 [employer]. This information is not intended to replace advice given to you by your health care provider. Make sure you discuss any questions you have with your health care provider. ° °Abscess °Care After °An abscess (also called a boil or furuncle) is an infected area that contains a collection of pus. Signs and symptoms of an abscess include pain, tenderness, redness, or hardness, or you may feel a moveable soft area under your skin. An abscess can occur anywhere in the body. The infection may spread to surrounding tissues causing cellulitis. A cut (incision) by the surgeon was made over your abscess and the pus was drained out. Gauze may have been packed into the space to provide a drain that will allow the cavity to heal from the inside outwards. The boil may be painful for 5 to 7 days. Most people with a boil do not have   high fevers. Your abscess, if seen early, may not have localized, and may not have been lanced. If not, another appointment may be required for this if it does not get better on its own or with medications. °HOME CARE INSTRUCTIONS  °· Only take over-the-counter or prescription medicines for pain, discomfort, or fever as directed by your caregiver. °· When you bathe, soak and then  remove gauze or iodoform packs at least daily or as directed by your caregiver. You may then wash the wound gently with mild soapy water. Repack with gauze or do as your caregiver directs. °SEEK IMMEDIATE MEDICAL CARE IF:  °· You develop increased pain, swelling, redness, drainage, or bleeding in the wound site. °· You develop signs of generalized infection including muscle aches, chills, fever, or a general ill feeling. °· An oral temperature above 102° F (38.9° C) develops, not controlled by medication. °See your caregiver for a recheck if you develop any of the symptoms described above. If medications (antibiotics) were prescribed, take them as directed. °Document Released: 06/15/2005 Document Revised: 02/19/2012 Document Reviewed: 02/10/2008 °ExitCare® Patient Information ©2015 [employer]. This information is not intended to replace advice given to you by your health care provider. Make sure you discuss any questions you have with your health care provider. ° °

## 2015-05-05 NOTE — ED Provider Notes (Signed)
CSN: [id number]     Arrival date & time 05/05/15  1801 History  This chart was scribed for non-physician provider Waynetta Pean, PA-C, working with Malvin Johns, MD by Irene Pap, ED Scribe. This patient was seen in room WTR8/WTR8 and patient care was started at 6:24 PM.    Chief Complaint  Patient presents with  . Abscess    at top of glutteal cleft, since january   The history is provided by the patient. No language interpreter was used.  HPI Comments: Elizabeth David is a 24 y.o. female who presents to the Emergency Department complaining of a "boil" in between the top of the buttocks onset a few days ago. She states that they developed 6 weeks into her pregnancy, around January, but began to hurt more intensely a few days ago. She reports pain with sitting and when the area is palpated. She denies drainage to the area, fever, chills, nausea or vomiting. She reports that she has never had an abscesses like this before. She denies any fevers, chills, or discharge.  Patient denies any allergies to medications.   Past Medical History  Diagnosis Date  . Asthma     as child  . Anemia   . Active labor 12/04/2014  . SVD (spontaneous vaginal delivery) 12/04/2014   Past Surgical History  Procedure Laterality Date  . Tonsillectomy      at 24 years of age   No family history on file. History  Substance Use Topics  . Smoking status: Former Smoker -- 0.00 packs/day for 3 years    Types: Cigarettes    Quit date: 06/16/2012  . Smokeless tobacco: Never Used  . Alcohol Use: No   OB History    Gravida Para Term Preterm AB TAB SAB Ectopic Multiple Living   3 2 1 1 1  1  0 0 2     Review of Systems  Constitutional: Negative for fever and chills.  Gastrointestinal: Negative for nausea and vomiting.  Skin: Positive for color change.   Allergies  Review of patient's allergies indicates no known allergies.  Home Medications   Prior to Admission medications   Medication Sig Start Date  End Date Taking? Authorizing Provider  calcium carbonate (TUMS - DOSED IN MG ELEMENTAL CALCIUM) 500 MG chewable tablet Chew 1 tablet by mouth daily as needed for indigestion or heartburn.    Historical Provider, MD  ibuprofen (ADVIL,MOTRIN) 600 MG tablet Take 1 tablet (600 mg total) by mouth every 6 (six) hours. 12/05/14   Paula Compton, MD  naproxen (NAPROSYN) 500 MG tablet Take 1 tablet (500 mg total) by mouth 2 (two) times daily with a meal. 05/05/15   Waynetta Pean, PA-C  oxyCODONE-acetaminophen (PERCOCET/ROXICET) 5-325 MG per tablet Take 1 tablet by mouth every 4 (four) hours as needed (for pain scale less than 7). 12/05/14   Paula Compton, MD  Prenatal Vit-Fe Fumarate-FA (PRENATAL MULTIVITAMIN) TABS tablet Take 1 tablet by mouth daily at 12 noon.    Historical Provider, MD   BP 116/86 mmHg  Pulse 97  Temp(Src) 98.3 F (36.8 C) (Oral)  Resp 16  Ht 5\' 7"  (1.702 m)  Wt 230 lb (104.327 kg)  BMI 36.01 kg/m2  SpO2 99%  Physical Exam  Constitutional: She appears well-developed and well-nourished. No distress.  Nontoxic appearing.  HENT:  Head: Normocephalic and atraumatic.  Eyes: Right eye exhibits no discharge. Left eye exhibits no discharge.  Pulmonary/Chest: Effort normal. No respiratory distress.  Abdominal: Soft. There is no tenderness.  Genitourinary:     Neurological: She is alert. Coordination normal.  Skin: Skin is warm and dry. She is not diaphoretic. No pallor.  Induration to the superior aspect of bilateral gluteal cleft, no erythema, no discharge, no fluctuance.  Psychiatric: She has a normal mood and affect. Her behavior is normal.  Nursing note and vitals reviewed.   ED Course  INCISION AND DRAINAGE Date/Time: 05/05/2015 7:30 PM Performed by: Waynetta Pean Authorized by: Waynetta Pean Consent: Verbal consent obtained. Risks and benefits: risks, benefits and alternatives were discussed Consent given by: patient Patient understanding: patient states  understanding of the procedure being performed Patient consent: the patient's understanding of the procedure matches consent given Procedure consent: procedure consent matches procedure scheduled Relevant documents: relevant documents present and verified Test results: test results available and properly labeled Site marked: the operative site was marked Imaging studies: imaging studies available Required items: required blood products, implants, devices, and special equipment available Patient identity confirmed: verbally with patient Time out: Immediately prior to procedure a "time out" was called to verify the correct patient, procedure, equipment, support staff and site/side marked as required. Type: abscess Body area: anogenital Location details: gluteal cleft Anesthesia: local infiltration Local anesthetic: lidocaine 1% without epinephrine Anesthetic total: 2 ml Patient sedated: no Scalpel size: 11 Incision type: single straight Complexity: simple Drainage: purulent Drainage amount: moderate Wound treatment: wound left open Packing material: none Patient tolerance: Patient tolerated the procedure well with no immediate complications   (including critical care time) DIAGNOSTIC STUDIES: Oxygen Saturation is 99% on room air, normal by my interpretation.    COORDINATION OF CARE: 6:30 PM-Discussed treatment plan which includes ultrasound of the affected area with pt at bedside and pt agreed to plan.   6:42 PM-Discussed treatment plan which includes I&D with pt at bedside and pt agreed to plan. Will give patient morphine injection; patient states that she has a ride home  Labs Review Labs Reviewed - No data to display  Imaging Review No results found.   EKG Interpretation None      Filed Vitals:   05/05/15 1807  BP: 116/86  Pulse: 97  Temp: 98.3 F (36.8 C)  TempSrc: Oral  Resp: 16  Height: 5\' 7"  (1.702 m)  Weight: 230 lb (104.327 kg)  SpO2: 99%     MDM    Meds given in ED:  Medications  morphine 4 MG/ML injection 4 mg (4 mg Intramuscular Given 05/05/15 1848)  lidocaine (PF) (XYLOCAINE) 1 % injection 5 mL (5 mLs Infiltration Given 05/05/15 1950)    Discharge Medication List as of 05/05/2015  7:47 PM    START taking these medications   Details  naproxen (NAPROSYN) 500 MG tablet Take 1 tablet (500 mg total) by mouth 2 (two) times daily with a meal., Starting 05/05/2015, Until Discontinued, Print        Final diagnoses:  Abscess   Patient with skin abscess to the superior aspect of her gluteal cleft. Ultrasound was used to evaluate which shows area amenable to incision and drainage.  Abscess was not large enough to warrant packing or drain,  wound recheck in 2 days. Encouraged home warm soaks and flushing. No signs of surrounding cellulitis or erythema. Patient tolerated the incision and drainage well. A moderate amount of purulent drainage was obtained. The area was flushed with normal saline. Will d/c to home.  No antibiotic therapy is indicated. I advised the patient to follow-up with their primary care provider this week. I advised the patient  to return to the emergency department with new or worsening symptoms or new concerns. The patient verbalized understanding and agreement with plan.    I personally performed the services described in this documentation, which was scribed in my presence. The recorded information has been reviewed and is accurate.       Waynetta Pean, PA-C 05/05/15 2040  Malvin Johns, MD 05/06/15 [phone]

## 2015-12-04 ENCOUNTER — Encounter (HOSPITAL_COMMUNITY): Payer: Self-pay | Admitting: Emergency Medicine

## 2015-12-04 ENCOUNTER — Emergency Department (HOSPITAL_COMMUNITY): Payer: BLUE CROSS/BLUE SHIELD

## 2015-12-04 ENCOUNTER — Emergency Department (HOSPITAL_COMMUNITY)
Admission: EM | Admit: 2015-12-04 | Discharge: 2015-12-04 | Disposition: A | Discharge status: Home / Self Care | Payer: BLUE CROSS/BLUE SHIELD | Attending: Emergency Medicine | Admitting: Emergency Medicine

## 2015-12-04 DIAGNOSIS — H9201 Otalgia, right ear: Secondary | ICD-10-CM | POA: Insufficient documentation

## 2015-12-04 DIAGNOSIS — H66002 Acute suppurative otitis media without spontaneous rupture of ear drum, left ear: Secondary | ICD-10-CM

## 2015-12-04 DIAGNOSIS — Z791 Long term (current) use of non-steroidal anti-inflammatories (NSAID): Secondary | ICD-10-CM | POA: Insufficient documentation

## 2015-12-04 DIAGNOSIS — J45909 Unspecified asthma, uncomplicated: Secondary | ICD-10-CM | POA: Insufficient documentation

## 2015-12-04 DIAGNOSIS — Z87891 Personal history of nicotine dependence: Secondary | ICD-10-CM | POA: Insufficient documentation

## 2015-12-04 DIAGNOSIS — D649 Anemia, unspecified: Secondary | ICD-10-CM | POA: Insufficient documentation

## 2015-12-04 DIAGNOSIS — J0110 Acute frontal sinusitis, unspecified: Secondary | ICD-10-CM

## 2015-12-04 DIAGNOSIS — Z79899 Other long term (current) drug therapy: Secondary | ICD-10-CM | POA: Insufficient documentation

## 2015-12-04 MED ORDER — AMOXICILLIN 500 MG PO CAPS: 1000.0000 mg | ORAL_CAPSULE | Freq: Two times a day (BID) | ORAL | Status: DC

## 2015-12-04 MED ORDER — AMOXICILLIN 500 MG PO CAPS
1000.0000 mg | ORAL_CAPSULE | Freq: Once | ORAL | Status: AC
  Administered 2015-12-04: 1000 mg via ORAL
  Filled 2015-12-04: qty 2

## 2015-12-04 MED ORDER — IBUPROFEN 200 MG PO TABS
400.0000 mg | ORAL_TABLET | Freq: Once | ORAL | Status: AC
  Administered 2015-12-04: 400 mg via ORAL
  Filled 2015-12-04: qty 2

## 2015-12-04 NOTE — ED Provider Notes (Signed)
CSN: [id number]     Arrival date & time 12/04/15  1545 History  By signing my name below, I, Stephania Fragmin, attest that this documentation has been prepared under the direction and in the presence of [employer], PA-C. Electronically Signed: Stephania Fragmin, ED Scribe. 12/04/2015. 4:41 PM.    Chief Complaint  Patient presents with  . URI   The history is provided by the patient. No language interpreter was used.    HPI Comments: Elizabeth David is a 24 y.o. female who presents to the Emergency Department complaining of a gradual-onset, constant, moderate cough that is sometimes productive with yellow sputum, with onset last week. She states her symptoms started with URI-like symptoms that began last week after taking care of her sick children, including sinus pressure, bilateral otalgia - left greater than right -, and dark-yellow nasal drainage. However, she came to the ED today because she developed chest pain that occurs with the cough that started 3 days ago, and her family insisted she come get checked out. She states she feels like mucus is draining down the back of her throat. She believes she caught this illness from her children who have similar symptoms. She states she has been drinking plenty of fluids, which has been lightening the color of her mucus. She is unaware of any fever and denies calf pain or leg swelling.   Past Medical History  Diagnosis Date  . Asthma     as child  . Anemia   . Active labor 12/04/2014  . SVD (spontaneous vaginal delivery) 12/04/2014   Past Surgical History  Procedure Laterality Date  . Tonsillectomy      at 24 years of age   No family history on file. Social History  Substance Use Topics  . Smoking status: Former Smoker -- 0.00 packs/day for 3 years    Types: Cigarettes    Quit date: 06/16/2012  . Smokeless tobacco: Never Used  . Alcohol Use: No   OB History    Gravida Para Term Preterm AB TAB SAB Ectopic Multiple Living   3 2 1 1 1  1  0 0 2      Review of Systems A complete 10 system review of systems was obtained and all systems are negative except as noted in the HPI and PMH.    Allergies  Review of patient's allergies indicates no known allergies.  Home Medications   Prior to Admission medications   Medication Sig Start Date End Date Taking? Authorizing Provider  calcium carbonate (TUMS - DOSED IN MG ELEMENTAL CALCIUM) 500 MG chewable tablet Chew 1 tablet by mouth daily as needed for indigestion or heartburn.    Historical Provider, MD  ibuprofen (ADVIL,MOTRIN) 600 MG tablet Take 1 tablet (600 mg total) by mouth every 6 (six) hours. 12/05/14   Paula Compton, MD  naproxen (NAPROSYN) 500 MG tablet Take 1 tablet (500 mg total) by mouth 2 (two) times daily with a meal. 05/05/15   Waynetta Pean, PA-C  oxyCODONE-acetaminophen (PERCOCET/ROXICET) 5-325 MG per tablet Take 1 tablet by mouth every 4 (four) hours as needed (for pain scale less than 7). 12/05/14   Paula Compton, MD  Prenatal Vit-Fe Fumarate-FA (PRENATAL MULTIVITAMIN) TABS tablet Take 1 tablet by mouth daily at 12 noon.    Historical Provider, MD   BP 149/94 mmHg  Pulse 110  Temp(Src) 98.7 F (37.1 C) (Oral)  Resp 18  SpO2 97% Physical Exam  Constitutional: She is oriented to person, place, and time. She  appears well-developed and well-nourished. No distress.  HENT:  Head: Normocephalic and atraumatic.  Mouth/Throat: Oropharynx is clear and moist.  Left tympanic membrane is erythematous and bulging, right tympanic membrane with normal architecture and good light reflex. Posterior pharynx is mildly injected with no significant type tonsillar hypertrophy or exudate. Uvula is midline and soft palate rises symmetrically. She has mild tenderness to palpation of frontal or maxillary sinuses. Nasal mucosa edematous with purulent nasal discharge.  Eyes: Conjunctivae and EOM are normal. Pupils are equal, round, and reactive to light.  Neck: Normal range of motion. Neck  supple. No tracheal deviation present.  Cardiovascular: Normal rate, regular rhythm and intact distal pulses.   Pulmonary/Chest: Effort normal and breath sounds normal. No respiratory distress. She has no wheezes. She has no rales. She exhibits no tenderness.  Abdominal: Soft. Bowel sounds are normal. She exhibits no distension and no mass. There is no tenderness. There is no rebound and no guarding.  Musculoskeletal: Normal range of motion.  Neurological: She is alert and oriented to person, place, and time.  Skin: Skin is warm and dry.  Psychiatric: She has a normal mood and affect. Her behavior is normal.  Nursing note and vitals reviewed.   ED Course  Procedures (including critical care time)  DIAGNOSTIC STUDIES: Oxygen Saturation is 97% on RA, normal by my interpretation.    COORDINATION OF CARE: 4:39 PM - Suspect otitis media and viral infection. Negative XR results reviewed with pt. Discussed treatment plan with pt at bedside which includes oral antibiotics. Advised rest and plenty of fluids. Pt verbalized understanding and agreed to plan.  Imaging Review Dg Chest 2 View  12/04/2015  CLINICAL DATA:  Chest and nasal congestion.  Cough EXAM: CHEST  2 VIEW COMPARISON:  None. FINDINGS: The heart size and mediastinal contours are within normal limits. Both lungs are clear. The visualized skeletal structures are unremarkable. IMPRESSION: No active cardiopulmonary disease. Electronically Signed   By: Kerby Moors M.D.   On: 12/04/2015 16:17   I have personally reviewed and evaluated these images and lab results as part of my medical decision-making.  MDM   Final diagnoses:  None    Filed Vitals:   12/04/15 1553 12/04/15 1655  BP: 149/94 142/74  Pulse: 110 95  Temp: 98.7 F (37.1 C) 98.2 F (36.8 C)  TempSrc: Oral Oral  Resp: 18 18  SpO2: 97% 95%    Medications  amoxicillin (AMOXIL) capsule 1,000 mg (1,000 mg Oral Given 12/04/15 1659)  ibuprofen (ADVIL,MOTRIN) tablet  400 mg (400 mg Oral Given 12/04/15 1659)    Elizabeth David is 24 y.o. female presenting with cough, nasal congestion and rhinorrhea over the course of 2 days. Physical exam consistent with otitis media/sinusitis. No antibiotics in the last 2 months. Patient will be started on amoxicillin, will follow with primary care physician next week.  Evaluation does not show pathology that would require ongoing emergent intervention or inpatient treatment. Pt is hemodynamically stable and mentating appropriately. Discussed findings and plan with patient/guardian, who agrees with care plan. All questions answered. Return precautions discussed and outpatient follow up given.   Discharge Medication List as of 12/04/2015  4:42 PM    START taking these medications   Details  amoxicillin (AMOXIL) 500 MG capsule Take 2 capsules (1,000 mg total) by mouth 2 (two) times daily., Starting 12/04/2015, Until Discontinued, Print         I personally performed the services described in this documentation, which was scribed in my  presence. The recorded information has been reviewed and is accurate.     Monico Blitz, PA-C 12/05/15 1117  Fredia Sorrow, MD 12/05/15 [phone]

## 2015-12-04 NOTE — Discharge Instructions (Signed)
Use nasal saline (you can try [employer] Simply Saline) at least 4 times a day, use saline 5-10 minutes before using the fluticasone (flonase) nasal spray  Do not use Afrin (Oxymetazoline)  Rest, wash hands frequently  and drink plenty of water.  You may try counter medication such as Mucinex or Sudafed decongestant.  Take your antibiotics as directed and to completion. You should never have any leftover antibiotics! Push fluids and stay well hydrated.   Any antibiotic use can reduce the efficacy of hormonal birth control. Please use back up method of contraception.   Please follow with your primary care doctor in the next 2 days for a check-up. They must obtain records for further management.   Do not hesitate to return to the Emergency Department for any new, worsening or concerning symptoms.    Otitis Media, Adult Otitis media is redness, soreness, and inflammation of the middle ear. Otitis media may be caused by allergies or, most commonly, by infection. Often it occurs as a complication of the common cold. SIGNS AND SYMPTOMS Symptoms of otitis media may include:  Earache.  Fever.  Ringing in your ear.  Headache.  Leakage of fluid from the ear. DIAGNOSIS To diagnose otitis media, your health care provider will examine your ear with an otoscope. This is an instrument that allows your health care provider to see into your ear in order to examine your eardrum. Your health care provider also will ask you questions about your symptoms. TREATMENT  Typically, otitis media resolves on its own within 3-5 days. Your health care provider may prescribe medicine to ease your symptoms of pain. If otitis media does not resolve within 5 days or is recurrent, your health care provider may prescribe antibiotic medicines if he or she suspects that a bacterial infection is the cause. HOME CARE INSTRUCTIONS   If you were prescribed an antibiotic medicine, finish it all even if you start to  feel better.  Take medicines only as directed by your health care provider.  Keep all follow-up visits as directed by your health care provider. SEEK MEDICAL CARE IF:  You have otitis media only in one ear, or bleeding from your nose, or both.  You notice a lump on your neck.  You are not getting better in 3-5 days.  You feel worse instead of better. SEEK IMMEDIATE MEDICAL CARE IF:   You have pain that is not controlled with medicine.  You have swelling, redness, or pain around your ear or stiffness in your neck.  You notice that part of your face is paralyzed.  You notice that the bone behind your ear (mastoid) is tender when you touch it. MAKE SURE YOU:   Understand these instructions.  Will watch your condition.  Will get help right away if you are not doing well or get worse.   This information is not intended to replace advice given to you by your health care provider. Make sure you discuss any questions you have with your health care provider.   Document Released: 09/01/2004 Document Revised: 12/18/2014 Document Reviewed: 06/24/2013 Elsevier Interactive Patient Education [employer].

## 2015-12-04 NOTE — ED Notes (Addendum)
Per pt, states chest, nasal congestion and cold symptoms for a few days-no relief with OTC meds-cough

## 2016-05-17 ENCOUNTER — Ambulatory Visit (HOSPITAL_COMMUNITY)
Admission: EM | Admit: 2016-05-17 | Discharge: 2016-05-17 | Disposition: A | Discharge status: Other Acute Inpatient Hospital | Payer: BLUE CROSS/BLUE SHIELD

## 2016-05-17 ENCOUNTER — Emergency Department (HOSPITAL_COMMUNITY)
Admission: EM | Admit: 2016-05-17 | Discharge: 2016-05-17 | Disposition: A | Discharge status: Home / Self Care | Payer: Medicaid Other | Attending: Emergency Medicine | Admitting: Emergency Medicine

## 2016-05-17 ENCOUNTER — Encounter (HOSPITAL_COMMUNITY): Payer: Self-pay | Admitting: Emergency Medicine

## 2016-05-17 DIAGNOSIS — Z862 Personal history of diseases of the blood and blood-forming organs and certain disorders involving the immune mechanism: Secondary | ICD-10-CM | POA: Insufficient documentation

## 2016-05-17 DIAGNOSIS — J0100 Acute maxillary sinusitis, unspecified: Secondary | ICD-10-CM | POA: Insufficient documentation

## 2016-05-17 DIAGNOSIS — Z87891 Personal history of nicotine dependence: Secondary | ICD-10-CM | POA: Insufficient documentation

## 2016-05-17 DIAGNOSIS — J45909 Unspecified asthma, uncomplicated: Secondary | ICD-10-CM | POA: Insufficient documentation

## 2016-05-17 DIAGNOSIS — Z791 Long term (current) use of non-steroidal anti-inflammatories (NSAID): Secondary | ICD-10-CM | POA: Insufficient documentation

## 2016-05-17 DIAGNOSIS — Z792 Long term (current) use of antibiotics: Secondary | ICD-10-CM | POA: Insufficient documentation

## 2016-05-17 DIAGNOSIS — Z79899 Other long term (current) drug therapy: Secondary | ICD-10-CM | POA: Insufficient documentation

## 2016-05-17 DIAGNOSIS — R61 Generalized hyperhidrosis: Secondary | ICD-10-CM | POA: Insufficient documentation

## 2016-05-17 MED ORDER — AMOXICILLIN-POT CLAVULANATE 875-125 MG PO TABS: 1.0000 | ORAL_TABLET | Freq: Two times a day (BID) | ORAL | Status: DC

## 2016-05-17 NOTE — ED Notes (Signed)
Pt stable, ambulatory, states understanding of discharge instructions 

## 2016-05-17 NOTE — ED Notes (Signed)
Pt arrives via POV from home with 2-3 days nasal congestion, cough and headache. Denies recent fever. VSS. Pt alert, oriented x4, ambulatory, NAD at present.

## 2016-05-17 NOTE — ED Provider Notes (Signed)
CSN: [id number]     Arrival date & time 05/17/16  1355 History  By signing my name below, I, Griselda Miner, attest that this documentation has been prepared under the direction and in the presence of non-physician practitioner, Hyman Bible, PA-C Electronically Signed: Griselda Miner, Scribe. 05/17/2016. 3:38 PM.   Chief Complaint  Patient presents with  . Nasal Congestion   The history is provided by the patient. No language interpreter was used.  HPI Comments:  Elizabeth David is a 25 y.o. female who presents to the Emergency Department complaining of nasal congestion onset three days ago. She notes associated headache behind eyes, pressure in ears, productive cough, chills, and sweats. She has taken tylenol with no relief. Pt has SHx of smoking 1 pack/wk. Pt denies vomiting, diarrhea, abdominal pain, fever, ear discharge, difficulty hearing, tinnitus, and SOB. 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   Past Medical History  Diagnosis Date  . Asthma     as child  . Anemia   . Active labor 12/04/2014  . SVD (spontaneous vaginal delivery) 12/04/2014   Past Surgical History  Procedure Laterality Date  . Tonsillectomy      at 25 years of age   History reviewed. No pertinent family history. Social History  Substance Use Topics  . Smoking status: Former Smoker -- 0.00 packs/day for 3 years    Types: Cigarettes    Quit date: 06/16/2012  . Smokeless tobacco: Never Used  . Alcohol Use: No   OB History    Gravida Para Term Preterm AB TAB SAB Ectopic Multiple Living   3 2 1 1 1  1  0 0 2     Review of Systems  Constitutional: Positive for chills and diaphoresis.  HENT: Positive for congestion and sinus pressure. Negative for ear discharge and tinnitus.   Respiratory: Positive for cough. Negative for shortness of breath.   Gastrointestinal: Negative for nausea, vomiting, abdominal pain and diarrhea.  Neurological: Positive for headaches.  All other systems reviewed  and are negative.     Allergies  Review of patient's allergies indicates no known allergies.  Home Medications   Prior to Admission medications   Medication Sig Start Date End Date Taking? Authorizing Provider  amoxicillin (AMOXIL) 500 MG capsule Take 2 capsules (1,000 mg total) by mouth 2 (two) times daily. 12/04/15   Nicole Pisciotta, PA-C  calcium carbonate (TUMS - DOSED IN MG ELEMENTAL CALCIUM) 500 MG chewable tablet Chew 1 tablet by mouth daily as needed for indigestion or heartburn.    Historical Provider, MD  ibuprofen (ADVIL,MOTRIN) 600 MG tablet Take 1 tablet (600 mg total) by mouth every 6 (six) hours. 12/05/14   Paula Compton, MD  naproxen (NAPROSYN) 500 MG tablet Take 1 tablet (500 mg total) by mouth 2 (two) times daily with a meal. 05/05/15   Waynetta Pean, PA-C  oxyCODONE-acetaminophen (PERCOCET/ROXICET) 5-325 MG per tablet Take 1 tablet by mouth every 4 (four) hours as needed (for pain scale less than 7). 12/05/14   Paula Compton, MD  Prenatal Vit-Fe Fumarate-FA (PRENATAL MULTIVITAMIN) TABS tablet Take 1 tablet by mouth daily at 12 noon.    Historical Provider, MD   BP 131/97 mmHg  Pulse 94  Temp(Src) 99.3 F (37.4 C) (Oral)  Resp 16  Ht 5\' 6"  (1.676 m)  Wt 224 lb 4 oz (101.719 kg)  BMI 36.21 kg/m2  SpO2 100%  LMP 05/10/2016 Physical Exam  Constitutional: She is oriented to person, place, and time.  She appears well-developed and well-nourished. No distress.  HENT:  Head: Normocephalic and atraumatic.  Right Ear: Tympanic membrane and ear canal normal.  Left Ear: Tympanic membrane and ear canal normal.  Nose: Right sinus exhibits maxillary sinus tenderness. Left sinus exhibits maxillary sinus tenderness.  Mouth/Throat: Uvula is midline and oropharynx is clear and moist. No trismus in the jaw.  Eyes: Conjunctivae are normal.  Neck: Normal range of motion. Neck supple.  Cardiovascular: Normal rate.   Pulmonary/Chest: Effort normal and breath sounds normal.   Lymphadenopathy:    She has no cervical adenopathy.  Neurological: She is alert and oriented to person, place, and time.  Skin: Skin is warm and dry.  Psychiatric: She has a normal mood and affect.  Nursing note and vitals reviewed.   ED Course  Procedures  DIAGNOSTIC STUDIES:  Oxygen Saturation is 100% on RA, normal by my interpretation.    COORDINATION OF CARE:  3:19 PM Recommended decongestant/allergy pill. Will write Rx for antibiotic which patient can fill if needed. Discussed treatment plan with pt at bedside and pt agreed to plan.  MDM   Final diagnoses:  None   Patient with signs and symptoms consistent with Acute Sinusitis.  Patient instructed to take a decongestant.  Patient also given antibiotic prescription.  Patient educated that most sinus infections are viral and not to fill the antibiotic for one week.  Patient stable for discharge.  Return precautions given.     Hyman Bible, PA-C 05/17/16 [place], MD 05/18/16 [phone]

## 2016-05-26 ENCOUNTER — Encounter (HOSPITAL_COMMUNITY): Payer: Self-pay | Admitting: Emergency Medicine

## 2016-05-26 ENCOUNTER — Emergency Department (HOSPITAL_COMMUNITY)
Admission: EM | Admit: 2016-05-26 | Discharge: 2016-05-27 | Disposition: A | Discharge status: Home / Self Care | Payer: Medicaid Other | Attending: Emergency Medicine | Admitting: Emergency Medicine

## 2016-05-26 DIAGNOSIS — J45909 Unspecified asthma, uncomplicated: Secondary | ICD-10-CM | POA: Insufficient documentation

## 2016-05-26 DIAGNOSIS — R197 Diarrhea, unspecified: Secondary | ICD-10-CM | POA: Insufficient documentation

## 2016-05-26 DIAGNOSIS — Z87891 Personal history of nicotine dependence: Secondary | ICD-10-CM | POA: Insufficient documentation

## 2016-05-26 DIAGNOSIS — R112 Nausea with vomiting, unspecified: Secondary | ICD-10-CM

## 2016-05-26 LAB — URINE MICROSCOPIC-ADD ON

## 2016-05-26 LAB — URINALYSIS, ROUTINE W REFLEX MICROSCOPIC
Bilirubin Urine: NEGATIVE
Glucose, UA: NEGATIVE mg/dL
Hgb urine dipstick: NEGATIVE
Ketones, ur: NEGATIVE mg/dL
NITRITE: NEGATIVE
PROTEIN: NEGATIVE mg/dL
Specific Gravity, Urine: 1.031 — ABNORMAL HIGH (ref 1.005–1.030)
pH: 7.5 (ref 5.0–8.0)

## 2016-05-26 LAB — COMPREHENSIVE METABOLIC PANEL
ALT: 14 U/L (ref 14–54)
AST: 14 U/L — ABNORMAL LOW (ref 15–41)
Albumin: 4.2 g/dL (ref 3.5–5.0)
Alkaline Phosphatase: 67 U/L (ref 38–126)
Anion gap: 7 (ref 5–15)
BUN: 14 mg/dL (ref 6–20)
CALCIUM: 8.8 mg/dL — AB (ref 8.9–10.3)
CHLORIDE: 104 mmol/L (ref 101–111)
CO2: 26 mmol/L (ref 22–32)
Creatinine, Ser: 0.74 mg/dL (ref 0.44–1.00)
Glucose, Bld: 104 mg/dL — ABNORMAL HIGH (ref 65–99)
Potassium: 3.4 mmol/L — ABNORMAL LOW (ref 3.5–5.1)
Sodium: 137 mmol/L (ref 135–145)
TOTAL PROTEIN: 7.8 g/dL (ref 6.5–8.1)
Total Bilirubin: 0.6 mg/dL (ref 0.3–1.2)

## 2016-05-26 LAB — CBC
HCT: 37.1 % (ref 36.0–46.0)
Hemoglobin: 12.2 g/dL (ref 12.0–15.0)
MCH: 26 pg (ref 26.0–34.0)
MCHC: 32.9 g/dL (ref 30.0–36.0)
MCV: 78.9 fL (ref 78.0–100.0)
PLATELETS: 311 10*3/uL (ref 150–400)
RBC: 4.7 MIL/uL (ref 3.87–5.11)
RDW: 15.9 % — ABNORMAL HIGH (ref 11.5–15.5)
WBC: 7.7 10*3/uL (ref 4.0–10.5)

## 2016-05-26 LAB — LIPASE, BLOOD: LIPASE: 22 U/L (ref 11–51)

## 2016-05-26 MED ORDER — ONDANSETRON 4 MG PO TBDP: 4.0000 mg | ORAL_TABLET | Freq: Once | ORAL | Status: DC | PRN

## 2016-05-26 NOTE — ED Notes (Addendum)
Patient presents for upper abdominal cramping, 3 episodes of emesis, 3 episodes of watery diarrhea x1 day. Denies fever, urinary symptoms. No relief with OTC medications. Rates pain 6/10. Pain worsens when lying supine.

## 2016-05-27 LAB — PREGNANCY, URINE: PREG TEST UR: NEGATIVE

## 2016-05-27 MED ORDER — METOCLOPRAMIDE HCL 5 MG/ML IJ SOLN
10.0000 mg | Freq: Once | INTRAMUSCULAR | Status: AC
  Administered 2016-05-27: 10 mg via INTRAVENOUS
  Filled 2016-05-27: qty 2

## 2016-05-27 MED ORDER — SODIUM CHLORIDE 0.9 % IV BOLUS (SEPSIS)
1000.0000 mL | Freq: Once | INTRAVENOUS | Status: AC
  Administered 2016-05-27: 1000 mL via INTRAVENOUS

## 2016-05-27 MED ORDER — METOCLOPRAMIDE HCL 10 MG PO TABS: 10.0000 mg | ORAL_TABLET | Freq: Four times a day (QID) | ORAL | Status: DC

## 2016-05-27 NOTE — ED Provider Notes (Signed)
CSN: [id number]     Arrival date & time 05/26/16  2001 History   First MD Initiated Contact with Patient 05/27/16 [phone]     Chief Complaint  Patient presents with  . Abdominal Pain  . Emesis  . Diarrhea     (Consider location/radiation/quality/duration/timing/severity/associated sxs/prior Treatment) HPI Comments: Patient presents with complaint of nausea, vomiting and diarrhea x 1 day. She denies fever, bloody stool or hematemesis. No sick contacts or known bad food exposure. She has not tried anything for symptoms. No dysuria, or hematuria. No back pain. No chest pain or SOB.   Patient is a 25 y.o. female presenting with abdominal pain, vomiting, and diarrhea. The history is provided by the patient. No language interpreter was used.  Abdominal Pain Pain location:  RUQ and LUQ Pain quality: cramping and sharp   Pain radiates to:  Does not radiate Pain severity:  Moderate Onset quality:  Gradual Duration:  1 day Timing:  Intermittent Associated symptoms: diarrhea, nausea and vomiting   Associated symptoms: no chills, no dysuria and no fever   Emesis Associated symptoms: abdominal pain and diarrhea   Associated symptoms: no chills and no myalgias   Diarrhea Associated symptoms: abdominal pain and vomiting   Associated symptoms: no chills, no fever and no myalgias     Past Medical History  Diagnosis Date  . Asthma     as child  . Anemia   . Active labor 12/04/2014  . SVD (spontaneous vaginal delivery) 12/04/2014   Past Surgical History  Procedure Laterality Date  . Tonsillectomy      at 25 years of age   No family history on file. Social History  Substance Use Topics  . Smoking status: Former Smoker -- 0.00 packs/day for 3 years    Types: Cigarettes    Quit date: 06/16/2012  . Smokeless tobacco: Never Used  . Alcohol Use: No   OB History    Gravida Para Term Preterm AB TAB SAB Ectopic Multiple Living   3 2 1 1 1  1  0 0 2     Review of Systems  Constitutional:  Negative for fever and chills.  HENT: Negative.   Respiratory: Negative.   Cardiovascular: Negative.   Gastrointestinal: Positive for nausea, vomiting, abdominal pain and diarrhea. Negative for blood in stool.  Genitourinary: Negative.  Negative for dysuria and pelvic pain.  Musculoskeletal: Negative.  Negative for myalgias and back pain.  Neurological: Negative.  Negative for syncope and weakness.      Allergies  Review of patient's allergies indicates no known allergies.  Home Medications   Prior to Admission medications   Medication Sig Start Date End Date Taking? Authorizing Provider  amoxicillin (AMOXIL) 500 MG capsule Take 2 capsules (1,000 mg total) by mouth 2 (two) times daily. Patient not taking: Reported on 05/27/2016 12/04/15   Elmyra Ricks Pisciotta, PA-C  amoxicillin-clavulanate (AUGMENTIN) 875-125 MG tablet Take 1 tablet by mouth 2 (two) times daily. Patient not taking: Reported on 05/27/2016 05/17/16   Hyman Bible, PA-C  ibuprofen (ADVIL,MOTRIN) 600 MG tablet Take 1 tablet (600 mg total) by mouth every 6 (six) hours. Patient not taking: Reported on 05/27/2016 12/05/14   Paula Compton, MD   BP 130/75 mmHg  Pulse 92  Temp(Src) 99.5 F (37.5 C) (Oral)  Resp 18  SpO2 99%  LMP 05/06/2016 Physical Exam  Constitutional: She is oriented to person, place, and time. She appears well-developed and well-nourished.  HENT:  Head: Normocephalic.  Neck: Normal range of motion. Neck supple.  Cardiovascular: Normal rate and regular rhythm.   No murmur heard. Pulmonary/Chest: Effort normal and breath sounds normal. She has no wheezes. She has no rales.  Abdominal: Soft. Bowel sounds are normal. There is tenderness. There is no rebound and no guarding.  Tenderness in upper abdomen, left greater than right upper quadrant.   Musculoskeletal: Normal range of motion.  Neurological: She is alert and oriented to person, place, and time.  Skin: Skin is warm and dry. No rash noted.   Psychiatric: She has a normal mood and affect.    ED Course  Procedures (including critical care time) Labs Review Labs Reviewed  COMPREHENSIVE METABOLIC PANEL - Abnormal; Notable for the following:    Potassium 3.4 (*)    Glucose, Bld 104 (*)    Calcium 8.8 (*)    AST 14 (*)    All other components within normal limits  CBC - Abnormal; Notable for the following:    RDW 15.9 (*)    All other components within normal limits  URINALYSIS, ROUTINE W REFLEX MICROSCOPIC (NOT AT [facility]) - Abnormal; Notable for the following:    Color, Urine AMBER (*)    Specific Gravity, Urine 1.031 (*)    Leukocytes, UA TRACE (*)    All other components within normal limits  URINE MICROSCOPIC-ADD ON - Abnormal; Notable for the following:    Squamous Epithelial / LPF 6-30 (*)    Bacteria, UA FEW (*)    All other components within normal limits  LIPASE, BLOOD  PREGNANCY, URINE   Results for orders placed or performed during the hospital encounter of 05/26/16  Lipase, blood  Result Value Ref Range   Lipase 22 11 - 51 U/L  Comprehensive metabolic panel  Result Value Ref Range   Sodium 137 135 - 145 mmol/L   Potassium 3.4 (L) 3.5 - 5.1 mmol/L   Chloride 104 101 - 111 mmol/L   CO2 26 22 - 32 mmol/L   Glucose, Bld 104 (H) 65 - 99 mg/dL   BUN 14 6 - 20 mg/dL   Creatinine, Ser 0.74 0.44 - 1.00 mg/dL   Calcium 8.8 (L) 8.9 - 10.3 mg/dL   Total Protein 7.8 6.5 - 8.1 g/dL   Albumin 4.2 3.5 - 5.0 g/dL   AST 14 (L) 15 - 41 U/L   ALT 14 14 - 54 U/L   Alkaline Phosphatase 67 38 - 126 U/L   Total Bilirubin 0.6 0.3 - 1.2 mg/dL   GFR calc non Af Amer >60 >60 mL/min   GFR calc Af Amer >60 >60 mL/min   Anion gap 7 5 - 15  CBC  Result Value Ref Range   WBC 7.7 4.0 - 10.5 K/uL   RBC 4.70 3.87 - 5.11 MIL/uL   Hemoglobin 12.2 12.0 - 15.0 g/dL   HCT 37.1 36.0 - 46.0 %   MCV 78.9 78.0 - 100.0 fL   MCH 26.0 26.0 - 34.0 pg   MCHC 32.9 30.0 - 36.0 g/dL   RDW 15.9 (H) 11.5 - 15.5 %   Platelets 311 150 - 400  K/uL  Urinalysis, Routine w reflex microscopic  Result Value Ref Range   Color, Urine AMBER (A) YELLOW   APPearance CLEAR CLEAR   Specific Gravity, Urine 1.031 (H) 1.005 - 1.030   pH 7.5 5.0 - 8.0   Glucose, UA NEGATIVE NEGATIVE mg/dL   Hgb urine dipstick NEGATIVE NEGATIVE   Bilirubin Urine NEGATIVE NEGATIVE   Ketones, ur NEGATIVE NEGATIVE mg/dL   Protein,  ur NEGATIVE NEGATIVE mg/dL   Nitrite NEGATIVE NEGATIVE   Leukocytes, UA TRACE (A) NEGATIVE  Urine microscopic-add on  Result Value Ref Range   Squamous Epithelial / LPF 6-30 (A) NONE SEEN   WBC, UA 0-5 0 - 5 WBC/hpf   RBC / HPF 0-5 0 - 5 RBC/hpf   Bacteria, UA FEW (A) NONE SEEN   Urine-Other MUCOUS PRESENT   Pregnancy, urine  Result Value Ref Range   Preg Test, Ur NEGATIVE NEGATIVE    Imaging Review No results found. I have personally reviewed and evaluated these images and lab results as part of my medical decision-making.   EKG Interpretation None      MDM   Final diagnoses:  None    1. Nausea, vomiting, diarrhea  The patient presents with N, V, D x 1 day. No fever. C/O abdominal cramping.  Symptoms resolved with IVF's and Reglan. She is feeling much better. VSS, labs reassuring. She can be discharged home with return precautions that should prompt return to ED.    Charlann Lange, PA-C 05/27/16 [place], MD 05/27/16 [phone]

## 2016-05-27 NOTE — Discharge Instructions (Signed)
Nausea and Vomiting °Nausea is a sick feeling that often comes before throwing up (vomiting). Vomiting is a reflex where stomach contents come out of your mouth. Vomiting can cause severe loss of body fluids (dehydration). Children and elderly adults can become dehydrated quickly, especially if they also have diarrhea. Nausea and vomiting are symptoms of a condition or disease. It is important to find the cause of your symptoms. °CAUSES  °· Direct irritation of the stomach lining. This irritation can result from increased acid production (gastroesophageal reflux disease), infection, food poisoning, taking certain medicines (such as nonsteroidal anti-inflammatory drugs), alcohol use, or tobacco use. °· Signals from the brain. These signals could be caused by a headache, heat exposure, an inner ear disturbance, increased pressure in the brain from injury, infection, a tumor, or a concussion, pain, emotional stimulus, or metabolic problems. °· An obstruction in the gastrointestinal tract (bowel obstruction). °· Illnesses such as diabetes, hepatitis, gallbladder problems, appendicitis, kidney problems, cancer, sepsis, atypical symptoms of a heart attack, or eating disorders. °· Medical treatments such as chemotherapy and radiation. °· Receiving medicine that makes you sleep (general anesthetic) during surgery. °DIAGNOSIS °Your caregiver may ask for tests to be done if the problems do not improve after a few days. Tests may also be done if symptoms are severe or if the reason for the nausea and vomiting is not clear. Tests may include: °· Urine tests. °· Blood tests. °· Stool tests. °· Cultures (to look for evidence of infection). °· X-rays or other imaging studies. °Test results can help your caregiver make decisions about treatment or the need for additional tests. °TREATMENT °You need to stay well hydrated. Drink frequently but in small amounts. You may wish to drink water, sports drinks, clear broth, or eat frozen  ice pops or gelatin dessert to help stay hydrated. When you eat, eating slowly may help prevent nausea. There are also some antinausea medicines that may help prevent nausea. °HOME CARE INSTRUCTIONS  °· Take all medicine as directed by your caregiver. °· If you do not have an appetite, do not force yourself to eat. However, you must continue to drink fluids. °· If you have an appetite, eat a normal diet unless your caregiver tells you differently. °¨ Eat a variety of complex carbohydrates (rice, wheat, potatoes, bread), lean meats, yogurt, fruits, and vegetables. °¨ Avoid high-fat foods because they are more difficult to digest. °· Drink enough water and fluids to keep your urine clear or pale yellow. °· If you are dehydrated, ask your caregiver for specific rehydration instructions. Signs of dehydration may include: °¨ Severe thirst. °¨ Dry lips and mouth. °¨ Dizziness. °¨ Dark urine. °¨ Decreasing urine frequency and amount. °¨ Confusion. °¨ Rapid breathing or pulse. °SEEK IMMEDIATE MEDICAL CARE IF:  °· You have blood or brown flecks (like coffee grounds) in your vomit. °· You have black or bloody stools. °· You have a severe headache or stiff neck. °· You are confused. °· You have severe abdominal pain. °· You have chest pain or trouble breathing. °· You do not urinate at least once every 8 hours. °· You develop cold or clammy skin. °· You continue to vomit for longer than 24 to 48 hours. °· You have a fever. °MAKE SURE YOU:  °· Understand these instructions. °· Will watch your condition. °· Will get help right away if you are not doing well or get worse. °  °This information is not intended to replace advice given to you by your health care provider. Make sure   You have a fever.  MAKE SURE YOU:   · Understand these instructions.  · Will watch your condition.  · Will get help right away if you are not doing well or get worse.     This information is not intended to replace advice given to you by your health care provider. Make sure you discuss any questions you have with your health care provider.     Document Released: 11/27/2005 Document Revised: 02/19/2012 Document Reviewed: 04/26/2011  Elsevier Interactive Patient Education ©2016 [employer]      Food Choices to Help Relieve  Diarrhea, Adult  When you have diarrhea, the foods you eat and your eating habits are very important. Choosing the right foods and drinks can help relieve diarrhea. Also, because diarrhea can last up to 7 days, you need to replace lost fluids and electrolytes (such as sodium, potassium, and chloride) in order to help prevent dehydration.   WHAT GENERAL GUIDELINES DO I NEED TO FOLLOW?  · Slowly drink 1 cup (8 oz) of fluid for each episode of diarrhea. If you are getting enough fluid, your urine will be clear or pale yellow.  · Eat starchy foods. Some good choices include white rice, white toast, pasta, low-fiber cereal, baked potatoes (without the skin), saltine crackers, and bagels.  · Avoid large servings of any cooked vegetables.  · Limit fruit to two servings per day. A serving is ½ cup or 1 small piece.  · Choose foods with less than 2 g of fiber per serving.  · Limit fats to less than 8 tsp (38 g) per day.  · Avoid fried foods.  · Eat foods that have probiotics in them. Probiotics can be found in certain dairy products.  · Avoid foods and beverages that may increase the speed at which food moves through the stomach and intestines (gastrointestinal tract). Things to avoid include:  ¨ High-fiber foods, such as dried fruit, raw fruits and vegetables, nuts, seeds, and whole grain foods.  ¨ Spicy foods and high-fat foods.  ¨ Foods and beverages sweetened with high-fructose corn syrup, honey, or sugar alcohols such as xylitol, sorbitol, and mannitol.  WHAT FOODS ARE RECOMMENDED?  Grains  White rice. White, French, or pita breads (fresh or toasted), including plain rolls, buns, or bagels. White pasta. Saltine, soda, or graham crackers. Pretzels. Low-fiber cereal. Cooked cereals made with water (such as cornmeal, farina, or cream cereals). Plain muffins. Matzo. Melba toast. Zwieback.   Vegetables  Potatoes (without the skin). Strained tomato and vegetable juices. Most well-cooked and canned vegetables without seeds.  Tender lettuce.  Fruits  Cooked or canned applesauce, apricots, cherries, fruit cocktail, grapefruit, peaches, pears, or plums. Fresh bananas, apples without skin, cherries, grapes, cantaloupe, grapefruit, peaches, oranges, or plums.   Meat and Other Protein Products  Baked or boiled chicken. Eggs. Tofu. Fish. Seafood. Smooth peanut butter. Ground or well-cooked tender beef, ham, veal, lamb, pork, or poultry.   Dairy  Plain yogurt, kefir, and unsweetened liquid yogurt. Lactose-free milk, buttermilk, or soy milk. Plain hard cheese.  Beverages  Sport drinks. Clear broths. Diluted fruit juices (except prune). Regular, caffeine-free sodas such as ginger ale. Water. Decaffeinated teas. Oral rehydration solutions. Sugar-free beverages not sweetened with sugar alcohols.  Other  Bouillon, broth, or soups made from recommended foods.   The items listed above may not be a complete list of recommended foods or beverages. Contact your dietitian for more options.  WHAT FOODS ARE NOT RECOMMENDED?  Grains  Whole grain, whole   wheat, bran, or rye breads, rolls, pastas, crackers, and cereals. Wild or brown rice. Cereals that contain more than 2 g of fiber per serving. Corn tortillas or taco shells. Cooked or dry oatmeal. Granola. Popcorn.  Vegetables  Raw vegetables. Cabbage, broccoli, Brussels sprouts, artichokes, baked beans, beet greens, corn, kale, legumes, peas, sweet potatoes, and yams. Potato skins. Cooked spinach and cabbage.  Fruits  Dried fruit, including raisins and dates. Raw fruits. Stewed or dried prunes. Fresh apples with skin, apricots, mangoes, pears, raspberries, and strawberries.   Meat and Other Protein Products  Chunky peanut butter. Nuts and seeds. Beans and lentils. Bacon.   Dairy  High-fat cheeses. Milk, chocolate milk, and beverages made with milk, such as milk shakes. Cream. Ice cream.  Sweets and Desserts  Sweet rolls, doughnuts, and sweet breads. Pancakes and waffles.  Fats and Oils  Butter. Cream sauces.  Margarine. Salad oils. Plain salad dressings. Olives. Avocados.   Beverages  Caffeinated beverages (such as coffee, tea, soda, or energy drinks). Alcoholic beverages. Fruit juices with pulp. Prune juice. Soft drinks sweetened with high-fructose corn syrup or sugar alcohols.  Other  Coconut. Hot sauce. Chili powder. Mayonnaise. Gravy. Cream-based or milk-based soups.   The items listed above may not be a complete list of foods and beverages to avoid. Contact your dietitian for more information.  WHAT SHOULD I DO IF I BECOME DEHYDRATED?  Diarrhea can sometimes lead to dehydration. Signs of dehydration include dark urine and dry mouth and skin. If you think you are dehydrated, you should rehydrate with an oral rehydration solution. These solutions can be purchased at pharmacies, retail stores, or online.   Drink ½-1 cup (120-240 mL) of oral rehydration solution each time you have an episode of diarrhea. If drinking this amount makes your diarrhea worse, try drinking smaller amounts more often. For example, drink 1-3 tsp (5-15 mL) every 5-10 minutes.   A general rule for staying hydrated is to drink 1½-2 L of fluid per day. Talk to your health care provider about the specific amount you should be drinking each day. Drink enough fluids to keep your urine clear or pale yellow.     This information is not intended to replace advice given to you by your health care provider. Make sure you discuss any questions you have with your health care provider.     Document Released: 02/17/2004 Document Revised: 12/18/2014 Document Reviewed: 10/20/2013  Elsevier Interactive Patient Education ©2016 [employer]

## 2016-10-28 ENCOUNTER — Emergency Department (HOSPITAL_COMMUNITY)
Admission: EM | Admit: 2016-10-28 | Discharge: 2016-10-28 | Disposition: A | Discharge status: Home / Self Care | Payer: Medicaid Other | Attending: Emergency Medicine | Admitting: Emergency Medicine

## 2016-10-28 ENCOUNTER — Encounter (HOSPITAL_COMMUNITY): Payer: Self-pay | Admitting: Emergency Medicine

## 2016-10-28 DIAGNOSIS — Z87891 Personal history of nicotine dependence: Secondary | ICD-10-CM | POA: Diagnosis not present

## 2016-10-28 DIAGNOSIS — Z79899 Other long term (current) drug therapy: Secondary | ICD-10-CM | POA: Insufficient documentation

## 2016-10-28 DIAGNOSIS — J069 Acute upper respiratory infection, unspecified: Secondary | ICD-10-CM | POA: Diagnosis not present

## 2016-10-28 DIAGNOSIS — J029 Acute pharyngitis, unspecified: Secondary | ICD-10-CM | POA: Diagnosis present

## 2016-10-28 DIAGNOSIS — J45909 Unspecified asthma, uncomplicated: Secondary | ICD-10-CM | POA: Diagnosis not present

## 2016-10-28 DIAGNOSIS — B9789 Other viral agents as the cause of diseases classified elsewhere: Secondary | ICD-10-CM

## 2016-10-28 MED ORDER — BENZONATATE 100 MG PO CAPS: 100.0000 mg | ORAL_CAPSULE | Freq: Three times a day (TID) | ORAL | 0 refills | Status: DC

## 2016-10-28 MED ORDER — FLUCONAZOLE 200 MG PO TABS: 200.0000 mg | ORAL_TABLET | Freq: Once | ORAL | 0 refills | Status: AC

## 2016-10-28 MED ORDER — PSEUDOEPHEDRINE HCL 30 MG PO TABS: 30.0000 mg | ORAL_TABLET | ORAL | 0 refills | Status: DC | PRN

## 2016-10-28 NOTE — ED Provider Notes (Signed)
[facility] DEPT Provider Note   CSN: [id number] Arrival date & time: 10/28/16  1112  By signing my name below, I, Camillo Flaming, attest that this documentation has been prepared under the direction and in the presence of Debroah Baller, NP. Electronically Signed: Camillo Flaming, Scribe. 10/28/16. 1:26 PM.   History   Chief Complaint Chief Complaint  Patient presents with  . Facial Pain  . Nasal Congestion  . Otalgia  . Sore Throat     The history is provided by the patient. No language interpreter was used.  URI   This is a new problem. The current episode started more than 2 days ago. The problem has been gradually worsening. There has been no fever. Associated symptoms include ear pain, headaches, sinus pain, sore throat and cough. Pertinent negatives include no diarrhea, no vomiting and no neck pain. She has tried other medications for the symptoms. The treatment provided mild relief.    HPI Comments: Elizabeth David is a 25 y.o. female who presents to the Emergency Department complaining generalized cold like symptoms that started ~ 4 days ago. She reports having associated facial pressure, sore throat, nasal congestion, chills, soft stools, night sweats, loss of appetite, dry cough, bilateral ear pain, and intermittent headache. Pt reports that her symptoms progressively worsened last night.  She reports taking left-over amoxicillin for her symptoms x 5 days with little relief and says she stopped taking them due to signs of yeast infection. Pt notes taking Tylenol for her pain without relief. She reports working night shifts at [employer] with possible sick contacts. She denies vomiting, diarrhea, neck stiffness, or lymphnode swelling.    Past Medical History:  Diagnosis Date  . Active labor 12/04/2014  . Anemia   . Asthma    as child  . SVD (spontaneous vaginal delivery) 12/04/2014    Patient Active Problem List   Diagnosis Date Noted  . Active labor 12/04/2014  . SVD (spontaneous  vaginal delivery) 12/04/2014  . VAGINAL DISCHARGE 06/03/2010  . FREQUENCY, URINARY 06/03/2010  . BACK STRAIN, LUMBAR 06/03/2010  . MORBID OBESITY 11/03/2009  . VITAMIN B12 DEFICIENCY 05/14/2009  . FATIGUE 05/06/2009  . DYSURIA 05/06/2009  . PAIN IN JOINT, ANKLE AND FOOT 08/13/2008    Past Surgical History:  Procedure Laterality Date  . TONSILLECTOMY     at 25 years of age    62 History    79 Para Term Preterm AB Living   3 2 1 1 1 2    SAB TAB Ectopic Multiple Live Births   1   0 0 2       Home Medications    Prior to Admission medications   Medication Sig Start Date End Date Taking? Authorizing Provider  amoxicillin (AMOXIL) 500 MG capsule Take 2 capsules (1,000 mg total) by mouth 2 (two) times daily. Patient not taking: Reported on 05/27/2016 12/04/15   Elmyra Ricks Pisciotta, PA-C  amoxicillin-clavulanate (AUGMENTIN) 875-125 MG tablet Take 1 tablet by mouth 2 (two) times daily. Patient not taking: Reported on 05/27/2016 05/17/16   Hyman Bible, PA-C  benzonatate (TESSALON) 100 MG capsule Take 1 capsule (100 mg total) by mouth every 8 (eight) hours. 10/28/16   Kamrie Fanton Bunnie Pion, NP  ibuprofen (ADVIL,MOTRIN) 600 MG tablet Take 1 tablet (600 mg total) by mouth every 6 (six) hours. Patient not taking: Reported on 05/27/2016 12/05/14   Paula Compton, MD  metoCLOPramide (REGLAN) 10 MG tablet Take 1 tablet (10 mg total) by mouth every 6 (six) hours. 05/27/16  Charlann Lange, PA-C  pseudoephedrine (SUDAFED) 30 MG tablet Take 1 tablet (30 mg total) by mouth every 4 (four) hours as needed for congestion. 10/28/16   Marnesha Gagen Bunnie Pion, NP    Family History No family history on file.  Social History Social History  Substance Use Topics  . Smoking status: Former Smoker    Packs/day: 0.00    Years: 3.00    Types: Cigarettes    Quit date: 06/16/2012  . Smokeless tobacco: Never Used  . Alcohol use No     Allergies   Patient has no known allergies.   Review of Systems Review of  Systems  Constitutional: Positive for appetite change, chills and diaphoresis.  HENT: Positive for ear pain, sinus pain, sinus pressure and sore throat.   Respiratory: Positive for cough.   Gastrointestinal: Negative for diarrhea and vomiting.  Musculoskeletal: Positive for back pain. Negative for neck pain and neck stiffness.  Neurological: Positive for headaches.     Physical Exam Updated Vital Signs BP 129/90 (BP Location: Left Arm)   Pulse 99   Temp 98.3 F (36.8 C) (Oral)   Resp 18   Ht 5\' 4"  (1.626 m)   LMP 10/06/2016   SpO2 100%   Physical Exam  Constitutional: She is oriented to person, place, and time. She appears well-developed and well-nourished.  HENT:  Head: Normocephalic and atraumatic.  TMs normal Slightly enlarged cervical anterior nodes Tenderness over bilateral maxillary sinuses Uvula midline with no edema or erythema   Eyes: EOM are normal. Pupils are equal, round, and reactive to light. Right eye exhibits no discharge. Left eye exhibits no discharge.  PERRL EOMI  sclera clear no discharge  Neck: Normal range of motion. Neck supple.  Cardiovascular: Normal rate and regular rhythm.   Pulmonary/Chest: Effort normal. No respiratory distress. She has no wheezes. She has no rales.  Lungs clear to ausculation  No chest wall tenderness  Abdominal: Soft. There is no tenderness.  No CVA tenderness  Musculoskeletal: Normal range of motion.  Neurological: She is alert and oriented to person, place, and time. No cranial nerve deficit.  Skin: Skin is warm and dry.  Psychiatric: She has a normal mood and affect. Her behavior is normal.  Nursing note and vitals reviewed.    ED Treatments / Results  DIAGNOSTIC STUDIES: Oxygen Saturation is 98% on RA, normal by my interpretation.    COORDINATION OF CARE: 12:24 PM Discussed treatment plan with pt at bedside and pt agreed to plan.   Labs (all labs ordered are listed, but only abnormal results are  displayed) Labs Reviewed - No data to display  Radiology No results found.  Procedures Procedures (including critical care time)  Medications Ordered in ED Medications - No data to display   Initial Impression / Assessment and Plan / ED Course  I have reviewed the triage vital signs and the nursing notes.  Pertinent labs & imaging results that were available during my care of the patient were reviewed by me and considered in my medical decision making (see chart for details).  Clinical Course    Pt symptoms consistent with URI. Pt will be discharged with symptomatic treatment.  Discussed return precautions.  Pt is hemodynamically stable & in NAD prior to discharge.  Final Clinical Impressions(s) / ED Diagnoses   Final diagnoses:  Viral URI with cough    New Prescriptions Discharge Medication List as of 10/28/2016 12:28 PM    START taking these medications   Details  benzonatate (  TESSALON) 100 MG capsule Take 1 capsule (100 mg total) by mouth every 8 (eight) hours., Starting Sat 10/28/2016, Print    fluconazole (DIFLUCAN) 200 MG tablet Take 1 tablet (200 mg total) by mouth once., Starting Sat 10/28/2016, Print    pseudoephedrine (SUDAFED) 30 MG tablet Take 1 tablet (30 mg total) by mouth every 4 (four) hours as needed for congestion., Starting Sat 10/28/2016, Print       I personally performed the services described in this documentation, which was scribed in my presence. The recorded information has been reviewed and is accurate.     [place], NP 10/29/16 1707    Lacretia Leigh, MD 11/01/16 1021

## 2016-10-28 NOTE — ED Triage Notes (Signed)
Patient c/o sore throat, bilat ear pain, nasal congestion and sinus facial pain/HA x week. Patient states that she has intermittent headaches that is relieved with Tylenol until it wears off.  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

## 2016-11-26 ENCOUNTER — Emergency Department (HOSPITAL_COMMUNITY)
Admission: EM | Admit: 2016-11-26 | Discharge: 2016-11-26 | Disposition: A | Discharge status: Home / Self Care | Payer: Medicaid Other | Attending: Emergency Medicine | Admitting: Emergency Medicine

## 2016-11-26 ENCOUNTER — Encounter (HOSPITAL_COMMUNITY): Payer: Self-pay

## 2016-11-26 ENCOUNTER — Emergency Department (HOSPITAL_COMMUNITY): Payer: Medicaid Other

## 2016-11-26 DIAGNOSIS — N760 Acute vaginitis: Secondary | ICD-10-CM | POA: Insufficient documentation

## 2016-11-26 DIAGNOSIS — J45909 Unspecified asthma, uncomplicated: Secondary | ICD-10-CM | POA: Diagnosis not present

## 2016-11-26 DIAGNOSIS — R102 Pelvic and perineal pain: Secondary | ICD-10-CM | POA: Diagnosis present

## 2016-11-26 DIAGNOSIS — B9689 Other specified bacterial agents as the cause of diseases classified elsewhere: Secondary | ICD-10-CM

## 2016-11-26 DIAGNOSIS — Z87891 Personal history of nicotine dependence: Secondary | ICD-10-CM | POA: Insufficient documentation

## 2016-11-26 DIAGNOSIS — B009 Herpesviral infection, unspecified: Secondary | ICD-10-CM | POA: Diagnosis not present

## 2016-11-26 DIAGNOSIS — D259 Leiomyoma of uterus, unspecified: Secondary | ICD-10-CM

## 2016-11-26 DIAGNOSIS — N898 Other specified noninflammatory disorders of vagina: Secondary | ICD-10-CM

## 2016-11-26 LAB — WET PREP, GENITAL
SPERM: NONE SEEN
Trich, Wet Prep: NONE SEEN
Yeast Wet Prep HPF POC: NONE SEEN

## 2016-11-26 LAB — POC URINE PREG, ED: PREG TEST UR: NEGATIVE

## 2016-11-26 LAB — URINALYSIS, ROUTINE W REFLEX MICROSCOPIC
BILIRUBIN URINE: NEGATIVE
Glucose, UA: NEGATIVE mg/dL
Ketones, ur: NEGATIVE mg/dL
NITRITE: NEGATIVE
PH: 6 (ref 5.0–8.0)
Protein, ur: NEGATIVE mg/dL
SPECIFIC GRAVITY, URINE: 1.016 (ref 1.005–1.030)

## 2016-11-26 MED ORDER — ACYCLOVIR 400 MG PO TABS: 400.0000 mg | ORAL_TABLET | Freq: Three times a day (TID) | ORAL | 0 refills | Status: DC

## 2016-11-26 MED ORDER — ONDANSETRON 8 MG PO TBDP
8.0000 mg | ORAL_TABLET | Freq: Once | ORAL | Status: AC
  Administered 2016-11-26: 8 mg via ORAL
  Filled 2016-11-26: qty 1

## 2016-11-26 MED ORDER — METRONIDAZOLE 500 MG PO TABS: 500.0000 mg | ORAL_TABLET | Freq: Two times a day (BID) | ORAL | 0 refills | Status: DC

## 2016-11-26 MED ORDER — IBUPROFEN 800 MG PO TABS
800.0000 mg | ORAL_TABLET | Freq: Once | ORAL | Status: AC
  Administered 2016-11-26: 800 mg via ORAL
  Filled 2016-11-26: qty 1

## 2016-11-26 MED ORDER — AZITHROMYCIN 250 MG PO TABS
1000.0000 mg | ORAL_TABLET | Freq: Once | ORAL | Status: AC
  Administered 2016-11-26: 1000 mg via ORAL
  Filled 2016-11-26: qty 4

## 2016-11-26 MED ORDER — LIDOCAINE-PRILOCAINE 2.5-2.5 % EX CREA: 1.0000 "application " | TOPICAL_CREAM | CUTANEOUS | 0 refills | Status: DC | PRN

## 2016-11-26 MED ORDER — CEFTRIAXONE SODIUM 250 MG IJ SOLR
250.0000 mg | Freq: Once | INTRAMUSCULAR | Status: AC
  Administered 2016-11-26: 250 mg via INTRAMUSCULAR
  Filled 2016-11-26: qty 250

## 2016-11-26 NOTE — ED Notes (Signed)
Delay in discharge-Nausea med given at discharge

## 2016-11-26 NOTE — ED Notes (Signed)
EDPA Provider at bedside. 

## 2016-11-26 NOTE — Discharge Instructions (Signed)
Read the information below.  Use the prescribed medication as directed.  Please discuss all new medications with your pharmacist.  You may return to the Emergency Department at any time for worsening condition or any new symptoms that concern you.   If you develop high fevers, worsening abdominal pain, uncontrolled vomiting, or are unable to tolerate fluids by mouth, return to the ER for a recheck.  ° °

## 2016-11-26 NOTE — ED Triage Notes (Signed)
PT C/O MOUTH LESIONS WITH SORE THROAT, VAGINAL IRRITATION, PAIN, AND DISCHARGE, AND VAGINAL AND BUTTOCKS RASHES X4 DAYS AFTER UNPROTECTED SEX. PT DENIES FEVER, BUT STS CHILLS.

## 2016-11-26 NOTE — ED Notes (Signed)
Ultrasound AT BEDSIDE 

## 2016-11-26 NOTE — ED Provider Notes (Signed)
[facility] DEPT Provider Note   CSN: [id number] Arrival date & time: 11/26/16  1107     History   Chief Complaint Chief Complaint  Patient presents with  . Vaginal Pain  . Mouth Lesions    HPI Elizabeth David is a 25 y.o. female.  HPI   Patient presents with 1 week of lower abdominal pain and back pain that is intermittent, random, and sharp.  4 days ago she developed abnormal vaginal discharge that is cloudy, white, constantly leaking out of her.  Has burning dysuria.  Has also developed bumps on her mouth and vagina, rash around her groin area and buttocks.  Also has sore throat of the same time frame.  Has used baby rash cream, hemorrhoid cream, using panty liners without improvement.  Associated chills.   Is sexually active with single female partner - she has been with him for one year but knows that he has had an additional sexual partner within the past month.   Past Medical History:  Diagnosis Date  . Active labor 12/04/2014  . Anemia   . Asthma    as child  . SVD (spontaneous vaginal delivery) 12/04/2014    Patient Active Problem List   Diagnosis Date Noted  . Active labor 12/04/2014  . SVD (spontaneous vaginal delivery) 12/04/2014  . VAGINAL DISCHARGE 06/03/2010  . FREQUENCY, URINARY 06/03/2010  . BACK STRAIN, LUMBAR 06/03/2010  . MORBID OBESITY 11/03/2009  . VITAMIN B12 DEFICIENCY 05/14/2009  . FATIGUE 05/06/2009  . DYSURIA 05/06/2009  . PAIN IN JOINT, ANKLE AND FOOT 08/13/2008    Past Surgical History:  Procedure Laterality Date  . TONSILLECTOMY     at 25 years of age    69 History    30 Para Term Preterm AB Living   3 2 1 1 1 2    SAB TAB Ectopic Multiple Live Births   1   0 0 2       Home Medications    Prior to Admission medications   Medication Sig Start Date End Date Taking? Authorizing Provider  phenylephrine-shark liver oil-mineral oil-petrolatum (PREPARATION H) 0.25-14-74.9 % rectal ointment Place 1 application rectally 2  (two) times daily as needed for hemorrhoids.   Yes Historical Provider, MD  acyclovir (ZOVIRAX) 400 MG tablet Take 1 tablet (400 mg total) by mouth 3 (three) times daily. 11/26/16   Clayton Bibles, PA-C  amoxicillin (AMOXIL) 500 MG capsule Take 2 capsules (1,000 mg total) by mouth 2 (two) times daily. Patient not taking: Reported on 11/26/2016 12/04/15   Elmyra Ricks Pisciotta, PA-C  amoxicillin-clavulanate (AUGMENTIN) 875-125 MG tablet Take 1 tablet by mouth 2 (two) times daily. Patient not taking: Reported on 11/26/2016 05/17/16   Hyman Bible, PA-C  benzonatate (TESSALON) 100 MG capsule Take 1 capsule (100 mg total) by mouth every 8 (eight) hours. Patient not taking: Reported on 11/26/2016 10/28/16   Ashley Murrain, NP  ibuprofen (ADVIL,MOTRIN) 600 MG tablet Take 1 tablet (600 mg total) by mouth every 6 (six) hours. Patient not taking: Reported on 11/26/2016 12/05/14   Paula Compton, MD  lidocaine-prilocaine (EMLA) cream Apply 1 application topically as needed. 11/26/16   Clayton Bibles, PA-C  metoCLOPramide (REGLAN) 10 MG tablet Take 1 tablet (10 mg total) by mouth every 6 (six) hours. Patient not taking: Reported on 11/26/2016 05/27/16   Charlann Lange, PA-C  metroNIDAZOLE (FLAGYL) 500 MG tablet Take 1 tablet (500 mg total) by mouth 2 (two) times daily. One po bid x 7 days 11/26/16  Clayton Bibles, PA-C  pseudoephedrine (SUDAFED) 30 MG tablet Take 1 tablet (30 mg total) by mouth every 4 (four) hours as needed for congestion. Patient not taking: Reported on 11/26/2016 10/28/16   Ashley Murrain, NP    Family History History reviewed. No pertinent family history.  Social History Social History  Substance Use Topics  . Smoking status: Former Smoker    Packs/day: 0.00    Years: 3.00    Types: Cigarettes    Quit date: 06/16/2012  . Smokeless tobacco: Never Used  . Alcohol use No     Allergies   Patient has no known allergies.   Review of Systems Review of Systems  All other systems reviewed and are  negative.    Physical Exam Updated Vital Signs BP 133/89 (BP Location: Right Arm)   Pulse 87   Temp 99 F (37.2 C) (Oral)   Resp 16   Ht 5\' 6"  (1.676 m)   Wt 99.8 kg   LMP 11/01/2016   SpO2 100%   BMI 35.51 kg/m   Physical Exam  Constitutional: She appears well-developed and well-nourished. No distress.  HENT:  Head: Normocephalic and atraumatic.  Single small oval ulcer over left lower inner lip.  Mild erythema of pharynx without edema or exudate.   Neck: Neck supple.  Cardiovascular: Regular rhythm.  Tachycardia present.   Pulmonary/Chest: Effort normal and breath sounds normal. No respiratory distress. She has no wheezes. She has no rales.  Abdominal: Soft. She exhibits no distension. There is tenderness in the right lower quadrant and suprapubic area. There is no rebound and no guarding.  Genitourinary:  Genitourinary Comments: Thick yellow malodorous vaginal discharge.   Diffuse tenderness on bimanual exam. Exam somewhat limited secondary to body habitus.  Multiple small ulcerations and papules vs vesicles surrounding vagina.    Neurological: She is alert.  Skin: She is not diaphoretic.  Nursing note and vitals reviewed.    ED Treatments / Results  Labs (all labs ordered are listed, but only abnormal results are displayed) Labs Reviewed  WET PREP, GENITAL - Abnormal; Notable for the following:       Result Value   Clue Cells Wet Prep HPF POC PRESENT (*)    WBC, Wet Prep HPF POC MANY (*)    All other components within normal limits  URINALYSIS, ROUTINE W REFLEX MICROSCOPIC - Abnormal; Notable for the following:    APPearance HAZY (*)    Hgb urine dipstick SMALL (*)    Leukocytes, UA LARGE (*)    Bacteria, UA RARE (*)    Squamous Epithelial / LPF 6-30 (*)    All other components within normal limits  HSV CULTURE AND TYPING  RPR  HIV ANTIBODY (ROUTINE TESTING)  POC URINE PREG, ED  GC/CHLAMYDIA PROBE AMP ([place]) NOT AT [facility]    EKG  EKG  Interpretation None       Radiology US Transvaginal Non-ob  Result Date: 11/26/2016 CLINICAL DATA:  Vaginal discharge, pelvic pain EXAM: TRANSABDOMINAL AND TRANSVAGINAL ULTRASOUND OF PELVIS TECHNIQUE: Both transabdominal and transvaginal ultrasound examinations of the pelvis were performed. Transabdominal technique was performed for global imaging of the pelvis including uterus, ovaries, adnexal regions, and pelvic cul-de-sac. It was necessary to proceed with endovaginal exam following the transabdominal exam to visualize the uterus, endometrium, ovaries and adnexa . COMPARISON:  None FINDINGS: Uterus Measurements: 12.0 x 6.3 x 8.3 cm. At least 2 posterior subserosal fundal fibroids, the largest 1.8 cm Endometrium Thickness: 12 mm in thickness.  No focal abnormality visualized. Right ovary Measurements: 3.3 x 1.5 x 3.0 cm. Multiple follicles, the largest 2 cm. No adnexal mass. Left ovary Measurements: 2.9 x 1.9 x 2.9 cm. Multiple follicles, the largest 1.4 cm. No adnexal mass. Other findings No abnormal free fluid. IMPRESSION: Small posterior subserosal fibroids. No acute findings. Electronically Signed   By: Rolm Baptise M.D.   On: 11/26/2016 14:25   US Pelvis Complete  Result Date: 11/26/2016 CLINICAL DATA:  Vaginal discharge, pelvic pain EXAM: TRANSABDOMINAL AND TRANSVAGINAL ULTRASOUND OF PELVIS TECHNIQUE: Both transabdominal and transvaginal ultrasound examinations of the pelvis were performed. Transabdominal technique was performed for global imaging of the pelvis including uterus, ovaries, adnexal regions, and pelvic cul-de-sac. It was necessary to proceed with endovaginal exam following the transabdominal exam to visualize the uterus, endometrium, ovaries and adnexa . COMPARISON:  None FINDINGS: Uterus Measurements: 12.0 x 6.3 x 8.3 cm. At least 2 posterior subserosal fundal fibroids, the largest 1.8 cm Endometrium Thickness: 12 mm in thickness.  No focal abnormality visualized. Right ovary  Measurements: 3.3 x 1.5 x 3.0 cm. Multiple follicles, the largest 2 cm. No adnexal mass. Left ovary Measurements: 2.9 x 1.9 x 2.9 cm. Multiple follicles, the largest 1.4 cm. No adnexal mass. Other findings No abnormal free fluid. IMPRESSION: Small posterior subserosal fibroids. No acute findings. Electronically Signed   By: Rolm Baptise M.D.   On: 11/26/2016 14:25    Procedures Procedures (including critical care time)  Medications Ordered in ED Medications  ibuprofen (ADVIL,MOTRIN) tablet 800 mg (800 mg Oral Given 11/26/16 1259)  cefTRIAXone (ROCEPHIN) injection 250 mg (250 mg Intramuscular Given 11/26/16 1552)  azithromycin (ZITHROMAX) tablet 1,000 mg (1,000 mg Oral Given 11/26/16 1552)  ondansetron (ZOFRAN-ODT) disintegrating tablet 8 mg (8 mg Oral Given 11/26/16 1626)     Initial Impression / Assessment and Plan / ED Course  I have reviewed the triage vital signs and the nursing notes.  Pertinent labs & imaging results that were available during my care of the patient were reviewed by me and considered in my medical decision making (see chart for details).  Clinical Course     Afebrile nontoxic patient with lower abdominal pain, abnormal vaginal discharge, dysuria, and genital/mouth lesions and genital/buttock rash.  Exam concerning for herpes infection.  Wet prep demonstrates clue cells. Patient agrees to empiric treatment for GC/Chlam.  Per nurse, she did vomit following abx administration but no pills visualized - advised patient to check My Chart and consider new treatment round if positive for chlamydia and still symptomatic.  Advised sexual partner will also need testing and treatment.  D/C home with flagyl, acyclovir, EMLA cream for painful ulcerative lesions.  Discussed result, findings, treatment, and follow up  with patient.  Pt given return precautions.  Pt verbalizes understanding and agrees with plan.      Final Clinical Impressions(s) / ED Diagnoses   Final diagnoses:    Pelvic pain  Vaginal discharge  BV (bacterial vaginosis)  Herpes simplex infection  Uterine leiomyoma, unspecified location    New Prescriptions Discharge Medication List as of 11/26/2016  2:59 PM    START taking these medications   Details  acyclovir (ZOVIRAX) 400 MG tablet Take 1 tablet (400 mg total) by mouth 3 (three) times daily., Starting Sun 11/26/2016, Print    lidocaine-prilocaine (EMLA) cream Apply 1 application topically as needed., Starting Sun 11/26/2016, Print    metroNIDAZOLE (FLAGYL) 500 MG tablet Take 1 tablet (500 mg total) by mouth 2 (two) times daily. One  po bid x 7 days, Starting Sun 11/26/2016, Print         [place], [place] 11/26/16 1634    Duffy Bruce, MD 11/27/16 [phone]

## 2016-11-26 NOTE — ED Notes (Signed)
Patient given scrub pants, mesh underwear, and socks due to incontinence episode.

## 2016-11-27 LAB — RPR: RPR: NONREACTIVE

## 2016-11-27 LAB — HIV ANTIBODY (ROUTINE TESTING W REFLEX): HIV Screen 4th Generation wRfx: NONREACTIVE

## 2016-11-27 LAB — GC/CHLAMYDIA PROBE AMP (~~LOC~~) NOT AT ARMC
Chlamydia: POSITIVE — AB
NEISSERIA GONORRHEA: NEGATIVE

## 2016-11-28 LAB — HSV CULTURE AND TYPING

## 2017-03-23 ENCOUNTER — Encounter (HOSPITAL_COMMUNITY): Payer: Self-pay | Admitting: Emergency Medicine

## 2017-03-23 ENCOUNTER — Ambulatory Visit (HOSPITAL_COMMUNITY)
Admission: EM | Admit: 2017-03-23 | Discharge: 2017-03-23 | Disposition: A | Discharge status: Home / Self Care | Payer: PRIVATE HEALTH INSURANCE | Attending: Family Medicine | Admitting: Family Medicine

## 2017-03-23 DIAGNOSIS — H65193 Other acute nonsuppurative otitis media, bilateral: Secondary | ICD-10-CM

## 2017-03-23 DIAGNOSIS — J309 Allergic rhinitis, unspecified: Secondary | ICD-10-CM

## 2017-03-23 MED ORDER — CETIRIZINE-PSEUDOEPHEDRINE ER 5-120 MG PO TB12: 1.0000 | ORAL_TABLET | Freq: Every day | ORAL | 0 refills | Status: DC

## 2017-03-23 MED ORDER — FLUTICASONE PROPIONATE 50 MCG/ACT NA SUSP: 2.0000 | Freq: Every day | NASAL | 0 refills | Status: DC

## 2017-03-23 NOTE — ED Triage Notes (Signed)
PT reports facial pain and pressure, right ear pain, and sinus pain.

## 2017-03-23 NOTE — ED Provider Notes (Addendum)
[place]    CSN: [id number] Arrival date & time: 03/23/17  1133     History   Chief Complaint Chief Complaint  Patient presents with  . Facial Pain    HPI Elizabeth David is a 26 y.o. female presenting for 3 days of constant congestion, ear fullness, and nonproductive cough with associated sinus pressure. She reports gradual onset of right ear fullness, decreased hearing, and clear rhinorrhea with congestion 3 days ago that has continued without attempted interventions. Her child was recently sick with similar symptoms and she notes cutting the lawn seemed to make this worse. She denies fevers, shortness of breath, chest pain, ear drainage, or visual changes.   HPI  Past Medical History:  Diagnosis Date  . Active labor 12/04/2014  . Anemia   . Asthma    as child  . SVD (spontaneous vaginal delivery) 12/04/2014    Patient Active Problem List   Diagnosis Date Noted  . Active labor 12/04/2014  . SVD (spontaneous vaginal delivery) 12/04/2014  . VAGINAL DISCHARGE 06/03/2010  . FREQUENCY, URINARY 06/03/2010  . BACK STRAIN, LUMBAR 06/03/2010  . MORBID OBESITY 11/03/2009  . VITAMIN B12 DEFICIENCY 05/14/2009  . FATIGUE 05/06/2009  . DYSURIA 05/06/2009  . PAIN IN JOINT, ANKLE AND FOOT 08/13/2008    Past Surgical History:  Procedure Laterality Date  . TONSILLECTOMY     at 26 years of age    31 History    2 Para Term Preterm AB Living   3 2 1 1 1 2    SAB TAB Ectopic Multiple Live Births   1   0 0 2       Home Medications    Prior to Admission medications   Medication Sig Start Date End Date Taking? Authorizing Provider  acyclovir (ZOVIRAX) 400 MG tablet Take 1 tablet (400 mg total) by mouth 3 (three) times daily. 11/26/16   Clayton Bibles, PA-C  cetirizine-pseudoephedrine (ZYRTEC-D) 5-120 MG tablet Take 1 tablet by mouth daily. 03/23/17   Patrecia Pour, MD  fluticasone (FLONASE) 50 MCG/ACT nasal spray Place 2 sprays into both nostrils daily. 03/23/17    Patrecia Pour, MD    Family History No family history on file.  Social History Social History  Substance Use Topics  . Smoking status: Former Smoker    Packs/day: 0.00    Years: 3.00    Types: Cigarettes    Quit date: 06/16/2012  . Smokeless tobacco: Never Used  . Alcohol use No     Allergies   Patient has no known allergies.   Review of Systems Review of Systems Per HPI  Physical Exam Triage Vital Signs ED Triage Vitals  Enc Vitals Group     BP 03/23/17 1202 (!) 166/99     Pulse Rate 03/23/17 1202 98     Resp 03/23/17 1202 16     Temp 03/23/17 1202 98.6 F (37 C)     Temp Source 03/23/17 1202 Oral     SpO2 03/23/17 1202 98 %     Weight 03/23/17 1200 224 lb (101.6 kg)     Height 03/23/17 1200 5\' 6"  (1.676 m)     Head Circumference --      Peak Flow --      Pain Score 03/23/17 1200 5     Pain Loc --      Pain Edu? --      Excl. in [place]? --    No data found.   Updated Vital Signs  BP (!) 166/99   Pulse 98   Temp 98.6 F (37 C) (Oral)   Resp 16   Ht 5\' 6"  (1.676 m)   Wt 224 lb (101.6 kg)   SpO2 98%   BMI 36.15 kg/m   Visual Acuity Right Eye Distance:   Left Eye Distance:   Bilateral Distance:    Right Eye Near:   Left Eye Near:    Bilateral Near:     Physical Exam  Constitutional: She appears well-developed and well-nourished. No distress.  HENT:  Head: Normocephalic and atraumatic.  bilateral ear canals normal with retracted TM's and clear effusion. No erythema. No mastoid tenderness. Bilateral trubinates boggy, injected, nearly occlusive with clear rhinorrhea. + tenderness to mastoid and frontal sinus palpation/percussion. Oropharynx erythematous without enlarged tonsils or exudates.  Eyes: Conjunctivae are normal.  Neck: Neck supple.  Cardiovascular: Normal rate and regular rhythm.   No murmur heard. Pulmonary/Chest: Effort normal and breath sounds normal. No respiratory distress.  Abdominal: Soft. There is no tenderness.  Musculoskeletal:  She exhibits no edema.  Neurological: She is alert.  Skin: Skin is warm and dry.  Psychiatric: She has a normal mood and affect.  Nursing note and vitals reviewed.    UC Treatments / Results  Labs (all labs ordered are listed, but only abnormal results are displayed) Labs Reviewed - No data to display  EKG  EKG Interpretation None       Radiology No results found.  Procedures Procedures (including critical care time)  Medications Ordered in UC Medications - No data to display   Initial Impression / Assessment and Plan / UC Course  I have reviewed the triage vital signs and the nursing notes.  Pertinent labs & imaging results that were available during my care of the patient were reviewed by me and considered in my medical decision making (see chart for details).  Final Clinical Impressions(s) / UC Diagnoses   Final diagnoses:  Acute allergic rhinitis, unspecified seasonality, unspecified trigger  Acute effusion of both middle ears   Healthy 26 y.o. female presenting with URI symptoms without fever concerning for viral URI vs. seasonal allergies. Will empirically treat with po antihistamine/decongestant and IN steroid. If no improvement in 2 weeks, return to clinic for consideration of ABRS.   Elevated BP noted, has history of higher BP with previous pregnancy. Not in severe range today. Urged to get PCP for regular follow up.   New Prescriptions New Prescriptions   CETIRIZINE-PSEUDOEPHEDRINE (ZYRTEC-D) 5-120 MG TABLET    Take 1 tablet by mouth daily.   FLUTICASONE (FLONASE) 50 MCG/ACT NASAL SPRAY    Place 2 sprays into both nostrils daily.     Patrecia Pour, MD 03/23/17 [place], MD 03/23/17 1247

## 2017-03-23 NOTE — Discharge Instructions (Signed)
You have symptoms due to inflammation in the nose which can be treated with a nose spray (flonase) and oral medication (zyrtec with decongestant). If you do not improve in the next 1 - 2 weeks, seek medical attention.

## 2017-05-10 ENCOUNTER — Emergency Department (HOSPITAL_COMMUNITY)
Admission: EM | Admit: 2017-05-10 | Discharge: 2017-05-10 | Disposition: A | Discharge status: Home / Self Care | Payer: PRIVATE HEALTH INSURANCE | Attending: Emergency Medicine | Admitting: Emergency Medicine

## 2017-05-10 ENCOUNTER — Encounter (HOSPITAL_COMMUNITY): Payer: Self-pay

## 2017-05-10 DIAGNOSIS — J45909 Unspecified asthma, uncomplicated: Secondary | ICD-10-CM | POA: Insufficient documentation

## 2017-05-10 DIAGNOSIS — Z79899 Other long term (current) drug therapy: Secondary | ICD-10-CM | POA: Diagnosis not present

## 2017-05-10 DIAGNOSIS — R197 Diarrhea, unspecified: Secondary | ICD-10-CM | POA: Insufficient documentation

## 2017-05-10 DIAGNOSIS — R1013 Epigastric pain: Secondary | ICD-10-CM | POA: Diagnosis present

## 2017-05-10 DIAGNOSIS — R112 Nausea with vomiting, unspecified: Secondary | ICD-10-CM | POA: Diagnosis not present

## 2017-05-10 DIAGNOSIS — R1012 Left upper quadrant pain: Secondary | ICD-10-CM | POA: Insufficient documentation

## 2017-05-10 DIAGNOSIS — R300 Dysuria: Secondary | ICD-10-CM | POA: Insufficient documentation

## 2017-05-10 DIAGNOSIS — Z87891 Personal history of nicotine dependence: Secondary | ICD-10-CM | POA: Diagnosis not present

## 2017-05-10 HISTORY — DX: Herpesviral infection of urogenital system, unspecified: A60.00

## 2017-05-10 LAB — COMPREHENSIVE METABOLIC PANEL
ALBUMIN: 3.4 g/dL — AB (ref 3.5–5.0)
ALT: 31 U/L (ref 14–54)
AST: 21 U/L (ref 15–41)
Alkaline Phosphatase: 42 U/L (ref 38–126)
Anion gap: 6 (ref 5–15)
BILIRUBIN TOTAL: 0.4 mg/dL (ref 0.3–1.2)
BUN: 9 mg/dL (ref 6–20)
CHLORIDE: 108 mmol/L (ref 101–111)
CO2: 27 mmol/L (ref 22–32)
CREATININE: 0.73 mg/dL (ref 0.44–1.00)
Calcium: 8.3 mg/dL — ABNORMAL LOW (ref 8.9–10.3)
GFR calc Af Amer: >60 mL/min (ref >60)
GFR calc non Af Amer: >60 mL/min (ref >60)
GLUCOSE: 103 mg/dL — AB (ref 65–99)
Potassium: 3.4 mmol/L — ABNORMAL LOW (ref 3.5–5.1)
Sodium: 141 mmol/L (ref 135–145)
TOTAL PROTEIN: 6.5 g/dL (ref 6.5–8.1)

## 2017-05-10 LAB — CBC
HEMATOCRIT: 35.4 % — AB (ref 36.0–46.0)
Hemoglobin: 11.6 g/dL — ABNORMAL LOW (ref 12.0–15.0)
MCH: 26.9 pg (ref 26.0–34.0)
MCHC: 32.8 g/dL (ref 30.0–36.0)
MCV: 81.9 fL (ref 78.0–100.0)
Platelets: 277 10*3/uL (ref 150–400)
RBC: 4.32 MIL/uL (ref 3.87–5.11)
RDW: 14.8 % (ref 11.5–15.5)
WBC: 5.3 10*3/uL (ref 4.0–10.5)

## 2017-05-10 LAB — URINALYSIS, ROUTINE W REFLEX MICROSCOPIC
BACTERIA UA: NONE SEEN
Bilirubin Urine: NEGATIVE
Glucose, UA: NEGATIVE mg/dL
Ketones, ur: NEGATIVE mg/dL
Nitrite: NEGATIVE
Protein, ur: 100 mg/dL — AB
Specific Gravity, Urine: 1.02 (ref 1.005–1.030)
pH: 6 (ref 5.0–8.0)

## 2017-05-10 LAB — WET PREP, GENITAL
CLUE CELLS WET PREP: NONE SEEN
Sperm: NONE SEEN
TRICH WET PREP: NONE SEEN
YEAST WET PREP: NONE SEEN

## 2017-05-10 LAB — POC URINE PREG, ED: PREG TEST UR: NEGATIVE

## 2017-05-10 LAB — LIPASE, BLOOD: Lipase: 30 U/L (ref 11–51)

## 2017-05-10 MED ORDER — GI COCKTAIL ~~LOC~~: 30.0000 mL | Freq: Once | ORAL | Status: DC

## 2017-05-10 NOTE — ED Notes (Signed)
This writer went to administer pt medication and pt has left w/o medication or paperwork. Unable to obtain discharge vitals/signature or administer medication. Pt left w/o further treatment or instruction-pt ambulatory. AOx4. Provider notified.

## 2017-05-10 NOTE — ED Provider Notes (Signed)
[facility] DEPT Provider Note   CSN: [id number] Arrival date & time: 05/10/17  1341     History   Chief Complaint Chief Complaint  Patient presents with  . Abdominal Pain  . Emesis  . Diarrhea    HPI Elizabeth David is a 26 y.o. female.  The history is provided by the patient.  Abdominal Pain   This is a new problem. Episode onset: 2 weeks. Episode frequency: intermittent. The problem has not changed since onset.The pain is associated with eating. The pain is located in the epigastric region. The quality of the pain is cramping. Associated symptoms include diarrhea, nausea and vomiting. Pertinent negatives include fever, melena and dysuria. The symptoms are aggravated by eating. The symptoms are relieved by antacids.  Emesis   The problem occurs 2 to 4 times per day. The problem has not changed since onset.There has been no fever. Associated symptoms include abdominal pain and diarrhea. Pertinent negatives include no fever. Risk factors include ill contacts.  Diarrhea   The problem occurs 2 to 4 times per day. The problem has been resolved. There has been no fever. Associated symptoms include abdominal pain and vomiting. She has tried nothing for the symptoms.   Reported daily EtOH use for 2 yrs and quit 2 weeks ago.  Pt currently on her cycle.  Past Medical History:  Diagnosis Date  . Active labor 12/04/2014  . Anemia   . Asthma    as child  . Genital herpes   . SVD (spontaneous vaginal delivery) 12/04/2014    Patient Active Problem List   Diagnosis Date Noted  . Active labor 12/04/2014  . SVD (spontaneous vaginal delivery) 12/04/2014  . VAGINAL DISCHARGE 06/03/2010  . FREQUENCY, URINARY 06/03/2010  . BACK STRAIN, LUMBAR 06/03/2010  . MORBID OBESITY 11/03/2009  . VITAMIN B12 DEFICIENCY 05/14/2009  . FATIGUE 05/06/2009  . DYSURIA 05/06/2009  . PAIN IN JOINT, ANKLE AND FOOT 08/13/2008    Past Surgical History:  Procedure Laterality Date  . TONSILLECTOMY       at 26 years of age    65 History    65 Para Term Preterm AB Living   3 2 1 1 1 2    SAB TAB Ectopic Multiple Live Births   1   0 0 2       Home Medications    Prior to Admission medications   Medication Sig Start Date End Date Taking? Authorizing Provider  cetirizine-pseudoephedrine (ZYRTEC-D) 5-120 MG tablet Take 1 tablet by mouth 2 (two) times daily as needed for allergies.   Yes [provider]  fluticasone (FLONASE) 50 MCG/ACT nasal spray Place 2 sprays into both nostrils daily. Patient taking differently: Place 2 sprays into both nostrils daily as needed for allergies.  03/23/17  Yes Patrecia Pour, MD  acyclovir (ZOVIRAX) 400 MG tablet Take 1 tablet (400 mg total) by mouth 3 (three) times daily. Patient not taking: Reported on 05/10/2017 11/26/16   Clayton Bibles, PA-C  cetirizine-pseudoephedrine (ZYRTEC-D) 5-120 MG tablet Take 1 tablet by mouth daily. Patient not taking: Reported on 05/10/2017 03/23/17   Patrecia Pour, MD    Family History Family History  Problem Relation Age of Onset  . Hypertension Mother   . Diabetes Mother   . Heart failure Other     Social History Social History  Substance Use Topics  . Smoking status: Former Smoker    Packs/day: 0.00    Years: 3.00    Types: Cigarettes  Quit date: 06/16/2012  . Smokeless tobacco: Never Used  . Alcohol use No     Allergies   Patient has no known allergies.   Review of Systems Review of Systems  Constitutional: Negative for fever.  Gastrointestinal: Positive for abdominal pain, diarrhea, nausea and vomiting. Negative for melena.  Genitourinary: Negative for dysuria.  All other systems are reviewed and are negative for acute change except as noted in the HPI    Physical Exam Updated Vital Signs BP (!) 150/91 (BP Location: Right Arm)   Pulse 82   Temp 98.7 F (37.1 C) (Oral)   Resp 16   Ht 5\' 6"  (1.676 m)   Wt 101.6 kg (224 lb)   LMP 05/10/2017   SpO2 98%   BMI 36.15 kg/m    Physical Exam  Constitutional: She is oriented to person, place, and time. She appears well-developed and well-nourished. No distress.  HENT:  Head: Normocephalic and atraumatic.  Nose: Nose normal.  Eyes: Conjunctivae and EOM are normal. Pupils are equal, round, and reactive to light. Right eye exhibits no discharge. Left eye exhibits no discharge. No scleral icterus.  Neck: Normal range of motion. Neck supple.  Cardiovascular: Normal rate and regular rhythm.  Exam reveals no gallop and no friction rub.   No murmur heard. Pulmonary/Chest: Effort normal and breath sounds normal. No stridor. No respiratory distress. She has no rales.  Abdominal: Soft. She exhibits no distension. There is no hepatosplenomegaly. There is tenderness in the left upper quadrant. There is no rigidity, no rebound and no guarding.  Genitourinary: Pelvic exam was performed with patient supine. There is no rash, tenderness or lesion on the right labia. There is no rash or lesion on the left labia. Cervix exhibits no motion tenderness, no discharge and no friability. Right adnexum displays no mass. Left adnexum displays no mass. There is bleeding in the vagina. No erythema or tenderness in the vagina. No foreign body in the vagina. No signs of injury around the vagina. No vaginal discharge found.  Genitourinary Comments: Chaperone present during pelvic exam.   Musculoskeletal: She exhibits no edema or tenderness.  Neurological: She is alert and oriented to person, place, and time.  Skin: Skin is warm and dry. No rash noted. She is not diaphoretic. No erythema.  Psychiatric: She has a normal mood and affect.  Vitals reviewed.    ED Treatments / Results  Labs (all labs ordered are listed, but only abnormal results are displayed) Labs Reviewed  WET PREP, GENITAL - Abnormal; Notable for the following:       Result Value   WBC, Wet Prep HPF POC FEW (*)    All other components within normal limits  COMPREHENSIVE  METABOLIC PANEL - Abnormal; Notable for the following:    Potassium 3.4 (*)    Glucose, Bld 103 (*)    Calcium 8.3 (*)    Albumin 3.4 (*)    All other components within normal limits  CBC - Abnormal; Notable for the following:    Hemoglobin 11.6 (*)    HCT 35.4 (*)    All other components within normal limits  URINALYSIS, ROUTINE W REFLEX MICROSCOPIC - Abnormal; Notable for the following:    APPearance HAZY (*)    Hgb urine dipstick LARGE (*)    Protein, ur 100 (*)    Leukocytes, UA MODERATE (*)    Squamous Epithelial / LPF 0-5 (*)    All other components within normal limits  LIPASE, BLOOD  POC URINE  PREG, ED  GC/CHLAMYDIA PROBE AMP ([facility]) NOT AT [facility]    EKG  EKG Interpretation None       Radiology No results found.  Procedures Procedures (including critical care time)  Medications Ordered in ED Medications - No data to display   Initial Impression / Assessment and Plan / ED Course  I have reviewed the triage vital signs and the nursing notes.  Pertinent labs & imaging results that were available during my care of the patient were reviewed by me and considered in my medical decision making (see chart for details).     1. Epigastric abd pain Left upper quadrant abdominal pain likely secondary to gastritis. Labs grossly reassuring without evidence of pancreatitis, biliary obstruction. No suspicious for serious intra-abdominal inflammatory/infectious process.  2. Dysuria.  Patient reported a history of prior HSV and recent STI. Pelvic exam without evidence of cervicitis or PID. Wet prep negative for Trich or BV. GC/chlamydia will be sent out. Do not feel that empiric coverage is required at this time. UA with blood and white blood cells consistent with her menstrual period. No bacteria noted. No indication to treat for urinary tract infection at this time.  The patient is safe for discharge with strict return precautions.   Final Clinical Impressions(s) /  ED Diagnoses   Final diagnoses:  Left upper quadrant pain  Dysuria   Disposition: Discharge  Condition: Good  I have discussed the results, Dx and Tx plan with the patient who expressed understanding and agree(s) with the plan. Discharge instructions discussed at great length. The patient was given strict return precautions who verbalized understanding of the instructions. No further questions at time of discharge.      Fatima Blank, MD 05/11/17 [phone]

## 2017-05-10 NOTE — ED Triage Notes (Addendum)
Patient c/o abdominal cramping, diarrhea, and diarrhea x 2 weeks. Patient states she was drinking alcohol daily until 2 weeks ago and that is when the vomiting and diarrhea started. Patient denies any blood  In her stool or emesis.  Patient states she has genital herpes and is having an outbreak.

## 2017-05-11 LAB — GC/CHLAMYDIA PROBE AMP (~~LOC~~) NOT AT ARMC
CHLAMYDIA, DNA PROBE: NEGATIVE
Neisseria Gonorrhea: NEGATIVE

## 2017-10-23 ENCOUNTER — Emergency Department (HOSPITAL_COMMUNITY)
Admission: EM | Admit: 2017-10-23 | Discharge: 2017-10-24 | Disposition: A | Discharge status: Other Acute Inpatient Hospital | Payer: PRIVATE HEALTH INSURANCE | Attending: Emergency Medicine | Admitting: Emergency Medicine

## 2017-10-23 ENCOUNTER — Ambulatory Visit (HOSPITAL_COMMUNITY)
Admission: RE | Admit: 2017-10-23 | Discharge: 2017-10-23 | Disposition: A | Discharge status: Home / Self Care | Payer: Self-pay | Attending: Psychiatry | Admitting: Psychiatry

## 2017-10-23 ENCOUNTER — Other Ambulatory Visit: Payer: Self-pay

## 2017-10-23 ENCOUNTER — Encounter (HOSPITAL_COMMUNITY): Payer: Self-pay | Admitting: Emergency Medicine

## 2017-10-23 DIAGNOSIS — R45851 Suicidal ideations: Secondary | ICD-10-CM | POA: Diagnosis not present

## 2017-10-23 DIAGNOSIS — Z87891 Personal history of nicotine dependence: Secondary | ICD-10-CM | POA: Diagnosis not present

## 2017-10-23 DIAGNOSIS — F1010 Alcohol abuse, uncomplicated: Secondary | ICD-10-CM | POA: Insufficient documentation

## 2017-10-23 DIAGNOSIS — Z1330 Encounter for screening examination for mental health and behavioral disorders, unspecified: Secondary | ICD-10-CM | POA: Insufficient documentation

## 2017-10-23 DIAGNOSIS — J45909 Unspecified asthma, uncomplicated: Secondary | ICD-10-CM | POA: Diagnosis not present

## 2017-10-23 DIAGNOSIS — F329 Major depressive disorder, single episode, unspecified: Secondary | ICD-10-CM | POA: Insufficient documentation

## 2017-10-23 DIAGNOSIS — F419 Anxiety disorder, unspecified: Secondary | ICD-10-CM | POA: Insufficient documentation

## 2017-10-23 DIAGNOSIS — Z79899 Other long term (current) drug therapy: Secondary | ICD-10-CM | POA: Insufficient documentation

## 2017-10-23 HISTORY — DX: Alcohol abuse, uncomplicated: F10.10

## 2017-10-23 LAB — SALICYLATE LEVEL

## 2017-10-23 LAB — COMPREHENSIVE METABOLIC PANEL
ALT: 17 U/L (ref 14–54)
ANION GAP: 9 (ref 5–15)
AST: 16 U/L (ref 15–41)
Albumin: 3.6 g/dL (ref 3.5–5.0)
Alkaline Phosphatase: 57 U/L (ref 38–126)
BUN: 12 mg/dL (ref 6–20)
CHLORIDE: 104 mmol/L (ref 101–111)
CO2: 25 mmol/L (ref 22–32)
CREATININE: 0.8 mg/dL (ref 0.44–1.00)
Calcium: 8.7 mg/dL — ABNORMAL LOW (ref 8.9–10.3)
GFR calc non Af Amer: >60 mL/min (ref >60)
Glucose, Bld: 107 mg/dL — ABNORMAL HIGH (ref 65–99)
POTASSIUM: 3.3 mmol/L — AB (ref 3.5–5.1)
SODIUM: 138 mmol/L (ref 135–145)
Total Bilirubin: 0.4 mg/dL (ref 0.3–1.2)
Total Protein: 6.8 g/dL (ref 6.5–8.1)

## 2017-10-23 LAB — CBC
HCT: 36 % (ref 36.0–46.0)
HEMOGLOBIN: 11.8 g/dL — AB (ref 12.0–15.0)
MCH: 27.2 pg (ref 26.0–34.0)
MCHC: 32.8 g/dL (ref 30.0–36.0)
MCV: 82.9 fL (ref 78.0–100.0)
Platelets: 308 10*3/uL (ref 150–400)
RBC: 4.34 MIL/uL (ref 3.87–5.11)
RDW: 15.7 % — ABNORMAL HIGH (ref 11.5–15.5)
WBC: 6.5 10*3/uL (ref 4.0–10.5)

## 2017-10-23 LAB — PREGNANCY, URINE: Preg Test, Ur: NEGATIVE

## 2017-10-23 LAB — RAPID URINE DRUG SCREEN, HOSP PERFORMED
Amphetamines: NOT DETECTED
BARBITURATES: NOT DETECTED
BENZODIAZEPINES: NOT DETECTED
Cocaine: NOT DETECTED
Opiates: NOT DETECTED
TETRAHYDROCANNABINOL: POSITIVE — AB

## 2017-10-23 LAB — TROPONIN I

## 2017-10-23 LAB — ACETAMINOPHEN LEVEL

## 2017-10-23 LAB — ETHANOL: Alcohol, Ethyl (B): 25 mg/dL — ABNORMAL HIGH (ref <10)

## 2017-10-23 MED ORDER — ACETAMINOPHEN 325 MG PO TABS
650.0000 mg | ORAL_TABLET | Freq: Four times a day (QID) | ORAL | Status: DC | PRN
  Administered 2017-10-23: 650 mg via ORAL
  Filled 2017-10-23: qty 2

## 2017-10-23 NOTE — H&P (Signed)
Behavioral Health Medical Screening Exam  Elizabeth David is an 26 y.o. female who arrived voluntarily to [facility] unaccompanied with c/o anxiety and suicide ideations. Patient is alert and oriented x4. She presents in a labile and depressive mood and affect. She stating that she needs help with alcohol detox too. Patient's HR is tachycardic.    Total Time spent with patient: 30 minutes  Psychiatric Specialty Exam: Physical Exam  Vitals reviewed. Constitutional: She is oriented to person, place, and time. She appears well-developed and well-nourished.  HENT:  Head: Normocephalic and atraumatic.  Eyes: Pupils are equal, round, and reactive to light.  Neck: Normal range of motion.  Cardiovascular: Normal rate, regular rhythm and normal heart sounds.  Respiratory: Effort normal and breath sounds normal.  GI: Soft. Bowel sounds are normal.  Musculoskeletal: Normal range of motion.  Neurological: She is alert and oriented to person, place, and time.  Skin: Skin is warm and dry.    Review of Systems  Psychiatric/Behavioral: Positive for depression, substance abuse and suicidal ideas.  All other systems reviewed and are negative.   Blood pressure (!) 134/97, pulse (!) 132, temperature 99.1 F (37.3 C), temperature source Oral, resp. rate 18, SpO2 99 %, unknown if currently breastfeeding.There is no height or weight on file to calculate BMI.  General Appearance: Disheveled  Eye Contact:  Fair  Speech:  Clear and Coherent and Normal Rate  Volume:  Normal  Mood:  Anxious and labile  Affect:  Blunt and Labile  Thought Process:  Coherent and Goal Directed  Orientation:  Full (Time, Place, and Person)  Thought Content:  WDL and Logical  Suicidal Thoughts:  Yes.  with intent/plan  Homicidal Thoughts:  No  Memory:  Immediate;   Good Recent;   Fair Remote;   Fair  Judgement:  Fair  Insight:  Present  Psychomotor Activity:  Normal  Concentration: Concentration: Fair and Attention Span: Fair   Recall:  [employer] of Knowledge:Good  Language: Good  Akathisia:  Negative  Handed:  Right  AIMS (if indicated):     Assets:  Communication Skills Desire for Improvement Financial Resources/Insurance Housing Intimacy  Sleep:       Musculoskeletal: Strength & Muscle Tone: within normal limits Gait & Station: normal Patient leans: N/A  Blood pressure (!) 134/97, pulse (!) 132, temperature 99.1 F (37.3 C), temperature source Oral, resp. rate 18, SpO2 99 %, unknown if currently breastfeeding.  Recommendations:  Based on my evaluation the patient appears to have an emergency medical condition for which I recommend the patient be transferred to the emergency department for further evaluation.  Patient to be admitted to psych inpatient when medically cleared.  Vicenta Aly, NP 10/23/2017, 2:18 PM

## 2017-10-23 NOTE — BH Assessment (Signed)
[facility] Assessment Progress Note  Pt is accepted to [facility], pending medical clearance. Labs not completed yet. Night AC to coordinate transfer once labs complete and pt medically cleared.  Elizabeth David. Lovena Le, [place], [place], [facility] Counselor

## 2017-10-23 NOTE — ED Notes (Signed)
Report called and transportation called via [facility].

## 2017-10-23 NOTE — ED Triage Notes (Signed)
Pt sent from [facility] for medical clearance for assessment. Pt report alcohol abuse, pt reports drinking daily and last drink was 1 pint this Am. Denies withdraw symptoms at this time yet reports abd pain . denies nausea nor emesis.

## 2017-10-23 NOTE — ED Provider Notes (Signed)
[facility] DEPT Provider Note   CSN: [id number] Arrival date & time: 10/23/17  1435     History   Chief Complaint Chief Complaint  Patient presents with  . Alcohol Problem    HPI Elizabeth David is a 26 y.o. female.  HPI   Elizabeth David is a 26 year old female with a history of obesity, asthma, alcohol abuse, cannabis use who presents emergency department from behavioral health clinic for suicidal ideation.  Patient states that she has been increasingly anxious over the past week, no known trigger.  States that she has been drinking 2 pints of alcohol each day whereas she normally drinks 1 pint daily for over a year now.  Also reports cannabis use today.  When she went to behavioral health clinic this morning she told them that she was unsure what she was going to do to herself when she left.  No specific plan of hurting herself, although she does have ideas of "not being here anymore."  She denies previous history of suicide attempt, or being on any sort of psychiatric medication.   Earlier today she states that she had chest pain, palpitations and shortness of breath when she was feeling increasingly anxious. Those symptoms have since subsided. She denies previous cardiac history or exertional chest pain. No known family history of MI.  Does not currently smoke, but states that she has in the past.  Currently she denies chest pain, shortness of breath, cough, fever, nausea/vomiting, numbness, weakness, dysuria, urinary frequency.  She denies leg swelling, hemoptysis, recent surgery or immobility, prior DVT/PE, exogenous estrogen use.  Patient reports that she has chronic epigastric pain and diarrhea for several years now. States that she thinks it is due to her heavy alcohol intake. She denies recent change.    Past Medical History:  Diagnosis Date  . Active labor 12/04/2014  . Alcohol abuse   . Anemia   . Asthma    as child  . Genital herpes   . SVD  (spontaneous vaginal delivery) 12/04/2014    Patient Active Problem List   Diagnosis Date Noted  . Active labor 12/04/2014  . SVD (spontaneous vaginal delivery) 12/04/2014  . VAGINAL DISCHARGE 06/03/2010  . FREQUENCY, URINARY 06/03/2010  . BACK STRAIN, LUMBAR 06/03/2010  . MORBID OBESITY 11/03/2009  . VITAMIN B12 DEFICIENCY 05/14/2009  . FATIGUE 05/06/2009  . DYSURIA 05/06/2009  . PAIN IN JOINT, ANKLE AND FOOT 08/13/2008    Past Surgical History:  Procedure Laterality Date  . TONSILLECTOMY     at 26 years of age    18 History    55 Para Term Preterm AB Living   3 2 1 1 1 2    SAB TAB Ectopic Multiple Live Births   1   0 0 2       Home Medications    Prior to Admission medications   Medication Sig Start Date End Date Taking? Authorizing Provider  acyclovir (ZOVIRAX) 400 MG tablet Take 1 tablet (400 mg total) by mouth 3 (three) times daily. Patient not taking: Reported on 05/10/2017 11/26/16   Clayton Bibles, PA-C  cetirizine-pseudoephedrine (ZYRTEC-D) 5-120 MG tablet Take 1 tablet by mouth daily. Patient not taking: Reported on 05/10/2017 03/23/17   Patrecia Pour, MD  cetirizine-pseudoephedrine (ZYRTEC-D) 5-120 MG tablet Take 1 tablet by mouth 2 (two) times daily as needed for allergies.    [provider]  fluticasone (FLONASE) 50 MCG/ACT nasal spray Place 2 sprays into both nostrils daily. Patient taking  differently: Place 2 sprays into both nostrils daily as needed for allergies.  03/23/17   Patrecia Pour, MD    Family History Family History  Problem Relation Age of Onset  . Hypertension Mother   . Diabetes Mother   . Heart failure Other     Social History Social History   Tobacco Use  . Smoking status: Former Smoker    Packs/day: 0.00    Years: 3.00    Pack years: 0.00    Types: Cigarettes    Last attempt to quit: 06/16/2012    Years since quitting: 5.3  . Smokeless tobacco: Never Used  Substance Use Topics  . Alcohol use: Yes    Comment: 1  pint of liquor daily  . Drug use: No     Allergies   Patient has no known allergies.   Review of Systems Review of Systems  Constitutional: Negative for chills, fatigue and fever.  HENT: Negative for congestion.   Respiratory: Negative for shortness of breath.   Cardiovascular: Negative for chest pain (episode of cp earlier today with anxiety, denies cp now) and leg swelling.  Gastrointestinal: Positive for abdominal pain (chronic epigastric) and diarrhea (chronic). Negative for blood in stool, nausea and vomiting.  Genitourinary: Negative for difficulty urinating, dysuria and flank pain.  Musculoskeletal: Negative for arthralgias and gait problem.  Skin: Negative for rash.  Neurological: Negative for weakness and numbness.  Psychiatric/Behavioral: Positive for self-injury. Negative for suicidal ideas. The patient is nervous/anxious.      Physical Exam Updated Vital Signs BP 136/90 (BP Location: Right Arm)   Pulse 98   Temp 98.7 F (37.1 C) (Oral)   Resp 17   Ht 5\' 6"  (1.676 m)   Wt 99.8 kg (220 lb)   LMP 10/16/2017   SpO2 99%   BMI 35.51 kg/m   Physical Exam  Constitutional: She is oriented to person, place, and time. She appears well-developed and well-nourished. No distress.  HENT:  Head: Normocephalic and atraumatic.  Mouth/Throat: Oropharynx is clear and moist. No oropharyngeal exudate.  Eyes: Conjunctivae and EOM are normal. Pupils are equal, round, and reactive to light. Right eye exhibits no discharge. Left eye exhibits no discharge.  Neck: Normal range of motion. Neck supple.  Cardiovascular: Normal rate, regular rhythm and intact distal pulses. Exam reveals no friction rub.  No murmur heard. Pulmonary/Chest: Effort normal and breath sounds normal. No stridor. No respiratory distress. She has no wheezes. She has no rales. She exhibits no tenderness.  Abdominal: Soft. Bowel sounds are normal. She exhibits no distension and no mass. There is no tenderness. There  is no guarding.  Musculoskeletal:  No leg swelling  Lymphadenopathy:    She has no cervical adenopathy.  Neurological: She is alert and oriented to person, place, and time. Coordination normal.  Mental Status:  Alert, oriented, thought content appropriate, able to give a coherent history. Speech fluent without evidence of aphasia. Able to follow 2 step commands without difficulty.  Cranial Nerves:  II:  Peripheral visual fields grossly normal, pupils equal, round, reactive to light III,IV, VI: ptosis not present, extra-ocular motions intact bilaterally  V,VII: smile symmetric, facial light touch sensation equal VIII: hearing grossly normal to voice  X: uvula elevates symmetrically  XI: bilateral shoulder shrug symmetric and strong XII: midline tongue extension without fassiculations Motor:  Normal tone. 5/5 in upper and lower extremities bilaterally including strong and equal grip strength and dorsiflexion/plantar flexion Sensory: Pinprick and light touch normal in all extremities.  Deep Tendon Reflexes: 2+ and symmetric in the biceps and patella Cerebellar: normal finger-to-nose with bilateral upper extremities Gait: normal gait and balance  Skin: Skin is warm and dry. Capillary refill takes less than 2 seconds. She is not diaphoretic.  Psychiatric: She has a normal mood and affect. Her behavior is normal.  Patient appears well.  No abnormal movements or tremors.  Voice with appropriate speed and volume.  No apparent delusions or hallucinations.  Nursing note and vitals reviewed.    ED Treatments / Results  Labs (all labs ordered are listed, but only abnormal results are displayed) Labs Reviewed  RAPID URINE DRUG SCREEN, HOSP PERFORMED - Abnormal; Notable for the following components:      Result Value   Tetrahydrocannabinol POSITIVE (*)    All other components within normal limits  COMPREHENSIVE METABOLIC PANEL - Abnormal; Notable for the following components:   Potassium 3.3  (*)    Glucose, Bld 107 (*)    Calcium 8.7 (*)    All other components within normal limits  ETHANOL - Abnormal; Notable for the following components:   Alcohol, Ethyl (B) 25 (*)    All other components within normal limits  CBC - Abnormal; Notable for the following components:   Hemoglobin 11.8 (*)    RDW 15.7 (*)    All other components within normal limits  ACETAMINOPHEN LEVEL - Abnormal; Notable for the following components:   Acetaminophen (Tylenol), Serum <10 (*)    All other components within normal limits  PREGNANCY, URINE  SALICYLATE LEVEL  TROPONIN I    EKG  EKG Interpretation None       Radiology No results found.  Procedures Procedures (including critical care time)  Medications Ordered in ED Medications - No data to display   Initial Impression / Assessment and Plan / ED Course  I have reviewed the triage vital signs and the nursing notes.  Pertinent labs & imaging results that were available during my care of the patient were reviewed by me and considered in my medical decision making (see chart for details).  Patient presents from behavioral health clinic with alcohol use today, anxiety and thoughts of hurting herself.  She reports that she had chest pain, shortness of breath and palpitations earlier today while she was having increased anxiety.  She denies chest pain at this time.  Given presentation, doubt ACS but did order EKG which was nonischemic, troponin negative.  She denies chest pain at this time. She is PERC negative, do not suspect PE.  Labs reviewed.  CMP and CBC unremarkable. Ethanol level 25, patient is clinically sober.  Acetaminophen level and salicylate level negative.  Rapid urine drug screen is positive for THC.  Patient is medically cleared.  TTS has been consulted. Dr. Parke Poisson will admit to behavioral health.   Final Clinical Impressions(s) / ED Diagnoses   Final diagnoses:  Suicidal ideation    ED Discharge Orders    None        Bernarda Caffey 10/24/17 1601    Milton Ferguson, MD 10/24/17 1531

## 2017-10-23 NOTE — BHH Counselor (Signed)
Clinician completed pt's support paperwork and faxed it to [facility].   Vertell Novak, MS, [facility], [facility] Triage Specialist [phone]

## 2017-10-23 NOTE — BH Assessment (Signed)
Elizabeth Boers, NP recommends inpatient after patient is medically cleared. TTS to look for placement.
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
## 2017-10-24 ENCOUNTER — Encounter (HOSPITAL_COMMUNITY): Payer: Self-pay

## 2017-10-24 ENCOUNTER — Inpatient Hospital Stay (HOSPITAL_COMMUNITY)
Admission: AD | Admit: 2017-10-24 | Discharge: 2017-10-26 | DRG: 897 | Disposition: A | Discharge status: Home / Self Care | Payer: Federal, State, Local not specified - Other | Source: Intra-hospital | Attending: Psychiatry | Admitting: Psychiatry

## 2017-10-24 ENCOUNTER — Other Ambulatory Visit: Payer: Self-pay

## 2017-10-24 DIAGNOSIS — Z91410 Personal history of adult physical and sexual abuse: Secondary | ICD-10-CM

## 2017-10-24 DIAGNOSIS — Z915 Personal history of self-harm: Secondary | ICD-10-CM | POA: Diagnosis not present

## 2017-10-24 DIAGNOSIS — R110 Nausea: Secondary | ICD-10-CM

## 2017-10-24 DIAGNOSIS — Z79899 Other long term (current) drug therapy: Secondary | ICD-10-CM | POA: Diagnosis not present

## 2017-10-24 DIAGNOSIS — F1994 Other psychoactive substance use, unspecified with psychoactive substance-induced mood disorder: Secondary | ICD-10-CM | POA: Diagnosis present

## 2017-10-24 DIAGNOSIS — G4700 Insomnia, unspecified: Secondary | ICD-10-CM | POA: Diagnosis not present

## 2017-10-24 DIAGNOSIS — F329 Major depressive disorder, single episode, unspecified: Secondary | ICD-10-CM | POA: Diagnosis present

## 2017-10-24 DIAGNOSIS — Z818 Family history of other mental and behavioral disorders: Secondary | ICD-10-CM

## 2017-10-24 DIAGNOSIS — Z87891 Personal history of nicotine dependence: Secondary | ICD-10-CM | POA: Diagnosis not present

## 2017-10-24 DIAGNOSIS — F4001 Agoraphobia with panic disorder: Secondary | ICD-10-CM | POA: Diagnosis present

## 2017-10-24 DIAGNOSIS — Z811 Family history of alcohol abuse and dependence: Secondary | ICD-10-CM | POA: Diagnosis not present

## 2017-10-24 DIAGNOSIS — Y901 Blood alcohol level of 20-39 mg/100 ml: Secondary | ICD-10-CM | POA: Diagnosis present

## 2017-10-24 DIAGNOSIS — F419 Anxiety disorder, unspecified: Secondary | ICD-10-CM | POA: Diagnosis not present

## 2017-10-24 DIAGNOSIS — R197 Diarrhea, unspecified: Secondary | ICD-10-CM

## 2017-10-24 DIAGNOSIS — F1290 Cannabis use, unspecified, uncomplicated: Secondary | ICD-10-CM | POA: Diagnosis present

## 2017-10-24 DIAGNOSIS — F332 Major depressive disorder, recurrent severe without psychotic features: Secondary | ICD-10-CM | POA: Diagnosis not present

## 2017-10-24 DIAGNOSIS — F1094 Alcohol use, unspecified with alcohol-induced mood disorder: Secondary | ICD-10-CM | POA: Diagnosis not present

## 2017-10-24 DIAGNOSIS — F10220 Alcohol dependence with intoxication, uncomplicated: Secondary | ICD-10-CM | POA: Diagnosis present

## 2017-10-24 DIAGNOSIS — F1024 Alcohol dependence with alcohol-induced mood disorder: Secondary | ICD-10-CM | POA: Diagnosis present

## 2017-10-24 DIAGNOSIS — F1099 Alcohol use, unspecified with unspecified alcohol-induced disorder: Secondary | ICD-10-CM | POA: Diagnosis not present

## 2017-10-24 MED ORDER — ALUM & MAG HYDROXIDE-SIMETH 200-200-20 MG/5ML PO SUSP: 30.0000 mL | ORAL | Status: DC | PRN

## 2017-10-24 MED ORDER — FLUTICASONE PROPIONATE 50 MCG/ACT NA SUSP: 2.0000 | Freq: Every day | NASAL | Status: DC | PRN

## 2017-10-24 MED ORDER — LORAZEPAM 1 MG PO TABS
1.0000 mg | ORAL_TABLET | Freq: Four times a day (QID) | ORAL | Status: AC
  Administered 2017-10-24 – 2017-10-25 (×6): 1 mg via ORAL
  Filled 2017-10-24 (×6): qty 1

## 2017-10-24 MED ORDER — ESCITALOPRAM OXALATE 10 MG PO TABS
10.0000 mg | ORAL_TABLET | Freq: Every day | ORAL | Status: DC
  Administered 2017-10-24 – 2017-10-26 (×3): 10 mg via ORAL
  Filled 2017-10-24 (×4): qty 1
  Filled 2017-10-24: qty 7

## 2017-10-24 MED ORDER — LORAZEPAM 1 MG PO TABS: 1.0000 mg | ORAL_TABLET | Freq: Two times a day (BID) | ORAL | Status: DC

## 2017-10-24 MED ORDER — LOPERAMIDE HCL 2 MG PO CAPS: 2.0000 mg | ORAL_CAPSULE | ORAL | Status: DC | PRN

## 2017-10-24 MED ORDER — HYDROXYZINE HCL 25 MG PO TABS
25.0000 mg | ORAL_TABLET | Freq: Four times a day (QID) | ORAL | Status: DC | PRN
  Administered 2017-10-25: 25 mg via ORAL
  Filled 2017-10-24: qty 1
  Filled 2017-10-24: qty 10

## 2017-10-24 MED ORDER — THIAMINE HCL 100 MG/ML IJ SOLN
100.0000 mg | Freq: Once | INTRAMUSCULAR | Status: AC
  Administered 2017-10-24: 100 mg via INTRAMUSCULAR
  Filled 2017-10-24: qty 2

## 2017-10-24 MED ORDER — ACETAMINOPHEN 325 MG PO TABS
650.0000 mg | ORAL_TABLET | Freq: Four times a day (QID) | ORAL | Status: DC | PRN
  Administered 2017-10-24 – 2017-10-26 (×3): 650 mg via ORAL
  Filled 2017-10-24 (×3): qty 2

## 2017-10-24 MED ORDER — ADULT MULTIVITAMIN W/MINERALS CH
1.0000 | ORAL_TABLET | Freq: Every day | ORAL | Status: DC
  Administered 2017-10-24 – 2017-10-26 (×3): 1 via ORAL
  Filled 2017-10-24 (×5): qty 1

## 2017-10-24 MED ORDER — ONDANSETRON 4 MG PO TBDP
4.0000 mg | ORAL_TABLET | Freq: Four times a day (QID) | ORAL | Status: DC | PRN
  Administered 2017-10-24 (×2): 4 mg via ORAL
  Filled 2017-10-24 (×2): qty 1

## 2017-10-24 MED ORDER — POTASSIUM CHLORIDE CRYS ER 20 MEQ PO TBCR
20.0000 meq | EXTENDED_RELEASE_TABLET | Freq: Two times a day (BID) | ORAL | Status: AC
  Administered 2017-10-24 – 2017-10-25 (×3): 20 meq via ORAL
  Filled 2017-10-24 (×3): qty 1

## 2017-10-24 MED ORDER — TRAZODONE HCL 50 MG PO TABS
50.0000 mg | ORAL_TABLET | Freq: Every evening | ORAL | Status: DC | PRN
  Administered 2017-10-24: 50 mg via ORAL
  Filled 2017-10-24 (×7): qty 1

## 2017-10-24 MED ORDER — VITAMIN B-1 100 MG PO TABS
100.0000 mg | ORAL_TABLET | Freq: Every day | ORAL | Status: DC
  Administered 2017-10-25 – 2017-10-26 (×2): 100 mg via ORAL
  Filled 2017-10-24 (×4): qty 1

## 2017-10-24 MED ORDER — LORAZEPAM 1 MG PO TABS: 1.0000 mg | ORAL_TABLET | Freq: Every day | ORAL | Status: DC

## 2017-10-24 MED ORDER — ALBUTEROL SULFATE HFA 108 (90 BASE) MCG/ACT IN AERS: 2.0000 | INHALATION_SPRAY | Freq: Four times a day (QID) | RESPIRATORY_TRACT | Status: DC | PRN

## 2017-10-24 MED ORDER — MAGNESIUM HYDROXIDE 400 MG/5ML PO SUSP: 30.0000 mL | Freq: Every day | ORAL | Status: DC | PRN

## 2017-10-24 MED ORDER — LORAZEPAM 1 MG PO TABS
1.0000 mg | ORAL_TABLET | Freq: Three times a day (TID) | ORAL | Status: AC
  Administered 2017-10-25 – 2017-10-26 (×3): 1 mg via ORAL
  Filled 2017-10-24 (×3): qty 1

## 2017-10-24 MED ORDER — LORAZEPAM 1 MG PO TABS
1.0000 mg | ORAL_TABLET | Freq: Four times a day (QID) | ORAL | Status: DC | PRN
  Administered 2017-10-24: 1 mg via ORAL
  Filled 2017-10-24 (×2): qty 1

## 2017-10-24 NOTE — Tx Team (Signed)
Initial Treatment Plan 10/24/2017 1:07 AM Arrie Aran SEL:[id number]    PATIENT STRESSORS: Financial difficulties Loss of housing-eviction Occupational concerns Substance abuse   PATIENT STRENGTHS: Curator fund of knowledge Physical Health Work skills   PATIENT IDENTIFIED PROBLEMS: Depression  Suicidal ideation  Substance abuse  "work on the drinking"  "working on my anxiety level"             DISCHARGE CRITERIA:  Improved stabilization in mood, thinking, and/or behavior Verbal commitment to aftercare and medication compliance Withdrawal symptoms are absent or subacute and managed without 24-hour nursing intervention  PRELIMINARY DISCHARGE PLAN: Outpatient therapy Medication management  PATIENT/FAMILY INVOLVEMENT: This treatment plan has been presented to and reviewed with the patient, LONA SIX.  The patient and family have been given the opportunity to ask questions and make suggestions.  Windell Moment, RN 10/24/2017, 1:07 AM

## 2017-10-24 NOTE — Tx Team (Signed)
Interdisciplinary Treatment and Diagnostic Plan Update  10/24/2017 Time of Session: 0830AM Elizabeth David MRN: [id number]  Principal Diagnosis: Substance Induced Mood Disorder  Secondary Diagnoses: Active Problems:   Substance induced mood disorder (HCC)   Current Medications:  Current Facility-Administered Medications  Medication Dose Route Frequency Provider Last Rate Last Dose  . acetaminophen (TYLENOL) tablet 650 mg  650 mg Oral Q6H PRN Patriciaann Clan E, PA-C      . albuterol (PROVENTIL HFA;VENTOLIN HFA) 108 (90 Base) MCG/ACT inhaler 2 puff  2 puff Inhalation Q6H PRN Laverle Hobby, PA-C      . alum & mag hydroxide-simeth (MAALOX/MYLANTA) 200-200-20 MG/5ML suspension 30 mL  30 mL Oral Q4H PRN Laverle Hobby, PA-C      . escitalopram (LEXAPRO) tablet 10 mg  10 mg Oral Daily Patriciaann Clan E, PA-C   10 mg at 10/24/17 2122  . fluticasone (FLONASE) 50 MCG/ACT nasal spray 2 spray  2 spray Each Nare Daily PRN Laverle Hobby, PA-C      . hydrOXYzine (ATARAX/VISTARIL) tablet 25 mg  25 mg Oral Q6H PRN Patriciaann Clan E, PA-C      . loperamide (IMODIUM) capsule 2-4 mg  2-4 mg Oral PRN Laverle Hobby, PA-C      . LORazepam (ATIVAN) tablet 1 mg  1 mg Oral Q6H PRN Laverle Hobby, PA-C   1 mg at 10/24/17 0147  . LORazepam (ATIVAN) tablet 1 mg  1 mg Oral QID Patriciaann Clan E, PA-C   1 mg at 10/24/17 4825   Followed by  . [START ON 10/25/2017] LORazepam (ATIVAN) tablet 1 mg  1 mg Oral TID Laverle Hobby, PA-C       Followed by  . [START ON 10/26/2017] LORazepam (ATIVAN) tablet 1 mg  1 mg Oral BID Patriciaann Clan E, PA-C       Followed by  . [START ON 10/28/2017] LORazepam (ATIVAN) tablet 1 mg  1 mg Oral Daily Simon, Spencer E, PA-C      . magnesium hydroxide (MILK OF MAGNESIA) suspension 30 mL  30 mL Oral Daily PRN Laverle Hobby, PA-C      . multivitamin with minerals tablet 1 tablet  1 tablet Oral Daily Laverle Hobby, PA-C   1 tablet at 10/24/17 0829  . ondansetron (ZOFRAN-ODT)  disintegrating tablet 4 mg  4 mg Oral Q6H PRN Laverle Hobby, PA-C   4 mg at 10/24/17 0146  . thiamine (B-1) injection 100 mg  100 mg Intramuscular Once Laverle Hobby, PA-C      . [START ON 10/25/2017] thiamine (VITAMIN B-1) tablet 100 mg  100 mg Oral Daily Simon, Spencer E, PA-C      . traZODone (DESYREL) tablet 50 mg  50 mg Oral QHS,MR X 1 Simon, Spencer E, PA-C       PTA Medications: Medications Prior to Admission  Medication Sig Dispense Refill Last Dose  . acyclovir (ZOVIRAX) 400 MG tablet Take 1 tablet (400 mg total) by mouth 3 (three) times daily. (Patient not taking: Reported on 05/10/2017) 30 tablet 0 Completed Course at Unknown time  . cetirizine-pseudoephedrine (ZYRTEC-D) 5-120 MG tablet Take 1 tablet by mouth daily. (Patient not taking: Reported on 05/10/2017) 30 tablet 0 Completed Course at Unknown time  . cetirizine-pseudoephedrine (ZYRTEC-D) 5-120 MG tablet Take 1 tablet by mouth 2 (two) times daily as needed for allergies.   Completed Course at Unknown time  . fluticasone (FLONASE) 50 MCG/ACT nasal spray Place 2 sprays into both  nostrils daily. (Patient taking differently: Place 2 sprays into both nostrils daily as needed for allergies. ) 16 g 0 unknown    Patient Stressors: Financial difficulties Loss of housing-eviction Occupational concerns Substance abuse  Patient Strengths: Curator fund of knowledge Physical Health Work skills  Treatment Modalities: Medication Management, Group therapy, Case management,  1 to 1 session with clinician, Psychoeducation, Recreational therapy.   Physician Treatment Plan for Primary Diagnosis: Substance Induced Mood Disorder Long Term Goal(s):     Short Term Goals:    Medication Management: Evaluate patient's response, side effects, and tolerance of medication regimen.  Therapeutic Interventions: 1 to 1 sessions, Unit Group sessions and Medication administration.  Evaluation of Outcomes: Not  Met  Physician Treatment Plan for Secondary Diagnosis: Active Problems:   Substance induced mood disorder ([place])  Long Term Goal(s):     Short Term Goals:       Medication Management: Evaluate patient's response, side effects, and tolerance of medication regimen.  Therapeutic Interventions: 1 to 1 sessions, Unit Group sessions and Medication administration.  Evaluation of Outcomes: Not Met   RN Treatment Plan for Primary Diagnosis: Substance Induced Mood Disorder Long Term Goal(s): Knowledge of disease and therapeutic regimen to maintain health will improve  Short Term Goals: Ability to remain free from injury will improve, Ability to verbalize feelings will improve and Ability to disclose and discuss suicidal ideas  Medication Management: RN will administer medications as ordered by provider, will assess and evaluate patient's response and provide education to patient for prescribed medication. RN will report any adverse and/or side effects to prescribing provider.  Therapeutic Interventions: 1 on 1 counseling sessions, Psychoeducation, Medication administration, Evaluate responses to treatment, Monitor vital signs and CBGs as ordered, Perform/monitor CIWA, COWS, AIMS and Fall Risk screenings as ordered, Perform wound care treatments as ordered.  Evaluation of Outcomes: Progressing   LCSW Treatment Plan for Primary Diagnosis: Substance Induced Mood Disorder Long Term Goal(s): Safe transition to appropriate next level of care at discharge, Engage patient in therapeutic group addressing interpersonal concerns.  Short Term Goals: Engage patient in aftercare planning with referrals and resources, Facilitate patient progression through stages of change regarding substance use diagnoses and concerns and Identify triggers associated with mental health/substance abuse issues  Therapeutic Interventions: Assess for all discharge needs, 1 to 1 time with Social worker, Explore available  resources and support systems, Assess for adequacy in community support network, Educate family and significant other(s) on suicide prevention, Complete Psychosocial Assessment, Interpersonal group therapy.  Evaluation of Outcomes: Progressing   Progress in Treatment: Attending groups: No. New to unit.  Participating in groups: No. Taking medication as prescribed: Yes Toleration medication: Yes Family/Significant other contact made: No, will contact:  family member if patient consents Patient understands diagnosis: Yes. Discussing patient identified problems/goals with staff: Yes. Medical problems stabilized or resolved: Yes. Denies suicidal/homicidal ideation: Yes. Issues/concerns per patient self-inventory: No. Other: n/a   New problem(s) identified: No, Describe:  n/a  New Short Term/Long Term Goal(s): medication management for mood stabilization; detox, development of comprehensive mental wellness/sobriety plan.   Discharge Plan or Barriers: CSW assessing for appropriate referrals. Pt to be provided with AA/NA information and [place] pamphlet for additional community supports.   Reason for Continuation of Hospitalization: Anxiety Depression Medication stabilization Suicidal ideation Withdrawal symptoms  Estimated Length of Stay: Monday, 10/29/17  Attendees: Patient: 10/24/2017 9:51 AM  Physician: Dr. Parke Poisson MD; Dr. Nancy Fetter MD 10/24/2017 9:51 AM  Nursing: Watt Climes RN 10/24/2017 9:51 AM  RN Care Manager: Lars Pinks [facility] 10/24/2017 9:51 AM  Social Worker: Maxie Better, LCSW 10/24/2017 9:51 AM  Recreational Therapist: x 10/24/2017 9:51 AM  Other: Lindell Spar NP; Darnelle Maffucci Money NP 10/24/2017 9:51 AM  Other:  10/24/2017 9:51 AM  Other: 10/24/2017 9:51 AM    Scribe for Treatment Team: [place], LCSW 10/24/2017 9:51 AM

## 2017-10-24 NOTE — Progress Notes (Signed)
Porter is a 26 year old female being admitted voluntarily to [facility] from [facility].  She came to [facility] as a walk in for suicidal ideation and alcohol abuse.  She was sent to [facility] to be medically cleared.  She reported difficulty with work and missing a lot of days.  Main support person is her boyfriend.  She reported drinking a pint of liquor daily and smoking pot 2-3 times per week.  "I drink throughout the day to keep a buzz going."  During [facility] admission, she denies suicidal ideation and will contract for safety on the unit.  She denies any HI or A/V hallucinations.  She is c/o of some nausea/cramping from withdrawals.  Oriented her to the unit.  Admission paperwork completed and signed.  Belongings searched and secured in locker # 83.  Skin assessment completed and noted multiple old scars and tattoos.  Q 15 minute checks initiated for safety.  We will monitor the progress towards her goals.

## 2017-10-24 NOTE — Progress Notes (Signed)
Recreation Therapy Notes  Date:  10/24/17 Time: 0930 Location: [address] Dayroom  Group Topic: Stress Management  Goal Area(s) Addresses:  Patient will verbalize importance of using healthy stress management.  Patient will identify positive emotions associated with healthy stress management.   Intervention: Stress Management  Activity :  Guided Imagery.  LRT introduced the stress management technique of guided imagery.  LRT read a script to help patients envision their safe and peaceful place.  Patients were to follow along as LRT read script.  Education:  Stress Management, Discharge Planning.   Education Outcome: Acknowledges edcuation/In group clarification offered/Needs additional education  Clinical Observations/Feedback: Pt did not attend group.    Victorino Sparrow, LRT/CTRS         Victorino Sparrow A 10/24/2017 12:51 PM
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
## 2017-10-24 NOTE — [facility] Suicide Risk Assessment (Signed)
[facility] INPATIENT:  Family/Significant Other Suicide Prevention Education  Suicide Prevention Education:  Contact Attempts: pt's boyfriend, [name] at [phone] has been identified by the patient as the family member/significant other with whom the patient will be residing, and identified as the person(s) who will aid the patient in the event of a mental health crisis.  With written consent from the patient, two attempts were made to provide suicide prevention education, prior to and/or following the patient's discharge.  We were unsuccessful in providing suicide prevention education.  A suicide education pamphlet was given to the patient to share with family/significant other.    Date and time of first attempt: 10/24/17 at 3:20PM Date and time of second attempt: 10/25/17 at 12:32PM  Alden Hipp, LCSW 10/24/2017, 3:21 PM

## 2017-10-24 NOTE — Progress Notes (Signed)
D) Pt has been attending the program and is interacting with her peers. Affect is flat and mood depressed. Pt rates her depression as a 6, hopelessness at a 5 and her anxiety at a 6. Denies SI presently, but admits to having fleeting thoughts on and off. States that she has tremendous stressors with her job. Feels worthless and empty. Feels as though she doesn't measure up.Tends to compare herself with others. A) Provided with a 1:1. Encouragement and praise given to Pt.  For coming to the hospital and seeking help. Role playing with Pt to help her see her worth and value. R) Pt denies SI presently, but states "I feel safe in the hospital".

## 2017-10-24 NOTE — [facility] Group Notes (Signed)
LCSW Group Therapy Note   10/24/2017 1:15pm   Type of Therapy and Topic:  Group Therapy:  Overcoming Obstacles   Participation Level:  Minimal   Description of Group:    In this group patients will be encouraged to explore what they see as obstacles to their own wellness and recovery. They will be guided to discuss their thoughts, feelings, and behaviors related to these obstacles. The group will process together ways to cope with barriers, with attention given to specific choices patients can make. Each patient will be challenged to identify changes they are motivated to make in order to overcome their obstacles. This group will be process-oriented, with patients participating in exploration of their own experiences as well as giving and receiving support and challenge from other group members.   Therapeutic Goals: 1. Patient will identify personal and current obstacles as they relate to admission. 2. Patient will identify barriers that currently interfere with their wellness or overcoming obstacles.  3. Patient will identify feelings, thought process and behaviors related to these barriers. 4. Patient will identify two changes they are willing to make to overcome these obstacles:      Summary of Patient Progress   Elizabeth David was attentive during group but did not actively participate in group discussion. She colored her pictures throughout group and remained throughout. She continues to demonstrate limited insight and some progress in the group setting.    Therapeutic Modalities:   Cognitive Behavioral Therapy Solution Focused Therapy Motivational Interviewing Relapse Prevention Therapy  Kimber Relic Smart, LCSW 10/24/2017 4:17 PM

## 2017-10-25 DIAGNOSIS — Z87891 Personal history of nicotine dependence: Secondary | ICD-10-CM

## 2017-10-25 DIAGNOSIS — F332 Major depressive disorder, recurrent severe without psychotic features: Secondary | ICD-10-CM

## 2017-10-25 DIAGNOSIS — J45909 Unspecified asthma, uncomplicated: Secondary | ICD-10-CM

## 2017-10-25 DIAGNOSIS — J302 Other seasonal allergic rhinitis: Secondary | ICD-10-CM

## 2017-10-25 DIAGNOSIS — F1099 Alcohol use, unspecified with unspecified alcohol-induced disorder: Secondary | ICD-10-CM

## 2017-10-25 MED ORDER — TRAZODONE HCL 100 MG PO TABS
100.0000 mg | ORAL_TABLET | Freq: Every day | ORAL | Status: DC
  Administered 2017-10-25: 100 mg via ORAL
  Filled 2017-10-25: qty 1
  Filled 2017-10-25: qty 7
  Filled 2017-10-25: qty 1

## 2017-10-25 NOTE — Progress Notes (Signed)
D: Patient is complaining of neck and shoulder pain.  She was given tylenol and now wants ibuprofen.  Patient complains of withdrawal symptoms such as cravings, agitation and irritability.  She denies any thoughts of self harm.  Patient has irritable mood; flat affect.  Her goal today is to "control my anxiety."  Patient rates her depression and anxiety as 4.   A: Continue to monitor medication management and MD orders.  Safety checks completed every 15 minutes per protocol.  Offer support and encouragement as needed. R: Patient is receptive to staff; her behavior is appropriate.

## 2017-10-25 NOTE — Progress Notes (Signed)
[facility] MD Progress Note  10/25/2017 4:00 PM Elizabeth David  MRN:  [id number]  Subjective: Tiarna reports, "I'm doing better today. I have been attending group sessions. I was in the [place] meetings held today. I'm learning how to manage my addiction. Will probably participate in the weekly AA meetings after discharge".  Objective: 26 year old female, history of alcohol dependence . Reports she had been drinking daily, heavily, up to 2 pints of liquor per day. States she feels her alcohol consumption has been worsening, and reports she has missed days at work due to alcohol and that boyfriend has expressed concern about her drinking . She also reports worsening depression, and reports pervasive sense of sadness, depression, easily overwhelmed. 10-25-17: Elizabeth David is seen, chart reviewed. She is alert, oriented x 3. She is visible on the, attending & participating in the group sessions. She says she doing a lot better. She says she was drinking heavily in the beginning to relax, get through the day, it gave her some buzz to get going. Then, the depression set in, was bad, a lot of crying spells. Not sure if she was drinking heavily because she was depressed or depressed because she was unable to handle her drinking any more. She says she is happy she came to the hospital. She says she is tolerating her treatment regimen. Says she is interested in going to [place] meetings after discharge because she has a family to support & a job to go to. She denies any SIHI, AVH, delusional thoughts or paranoia. She does not appear to be responding to any internal stimuli.   Principal Problem: Substance induced mood disorder ([place])  Diagnosis:   Patient Active Problem List   Diagnosis Date Noted  . Substance induced mood disorder ([place]) [F19.94] 10/24/2017    Priority: High  . Active labor [IMO0001] 12/04/2014  . SVD (spontaneous vaginal delivery) [O80] 12/04/2014  . VAGINAL DISCHARGE [N89.8] 06/03/2010  . FREQUENCY, URINARY  [R35.0] 06/03/2010  . BACK STRAIN, LUMBAR [S33.5XXA] 06/03/2010  . MORBID OBESITY [E66.01] 11/03/2009  . VITAMIN B12 DEFICIENCY [E53.8] 05/14/2009  . FATIGUE [R53.81, R53.83] 05/06/2009  . DYSURIA [R30.0] 05/06/2009  . PAIN IN JOINT, ANKLE AND FOOT [M25.579] 08/13/2008   Total Time spent with patient: 25 minutes  Past Psychiatric History: See H&P  Past Medical History:  Past Medical History:  Diagnosis Date  . Active labor 12/04/2014  . Alcohol abuse   . Anemia   . Asthma    as child  . Genital herpes   . SVD (spontaneous vaginal delivery) 12/04/2014    Past Surgical History:  Procedure Laterality Date  . TONSILLECTOMY     at 26 years of age   Family History:  Family History  Problem Relation Age of Onset  . Hypertension Mother   . Diabetes Mother   . Heart failure Other    Family Psychiatric  History: See H&P  Social History:  Social History   Substance and Sexual Activity  Alcohol Use Yes   Comment: 1 pint of liquor daily     Social History   Substance and Sexual Activity  Drug Use No    Social History   Socioeconomic History  . Marital status: Married    Spouse name: None  . Number of children: None  . Years of education: None  . Highest education level: None  Social Needs  . Financial resource strain: None  . Food insecurity - worry: None  . Food insecurity - inability: None  .  Transportation needs - medical: None  . Transportation needs - non-medical: None  Occupational History  . None  Tobacco Use  . Smoking status: Former Smoker    Packs/day: 0.00    Years: 3.00    Pack years: 0.00    Types: Cigarettes    Last attempt to quit: 06/16/2012    Years since quitting: 5.3  . Smokeless tobacco: Never Used  Substance and Sexual Activity  . Alcohol use: Yes    Comment: 1 pint of liquor daily  . Drug use: No  . Sexual activity: Yes    Partners: Male    Birth control/protection: None  Other Topics Concern  . None  Social History Narrative   . None   Additional Social History:   Sleep: Good  Appetite:  Good  Current Medications: Current Facility-Administered Medications  Medication Dose Route Frequency Provider Last Rate Last Dose  . acetaminophen (TYLENOL) tablet 650 mg  650 mg Oral Q6H PRN Laverle Hobby, PA-C   650 mg at 10/25/17 0804  . albuterol (PROVENTIL HFA;VENTOLIN HFA) 108 (90 Base) MCG/ACT inhaler 2 puff  2 puff Inhalation Q6H PRN Laverle Hobby, PA-C      . alum & mag hydroxide-simeth (MAALOX/MYLANTA) 200-200-20 MG/5ML suspension 30 mL  30 mL Oral Q4H PRN Laverle Hobby, PA-C      . escitalopram (LEXAPRO) tablet 10 mg  10 mg Oral Daily Patriciaann Clan E, PA-C   10 mg at 10/25/17 0801  . fluticasone (FLONASE) 50 MCG/ACT nasal spray 2 spray  2 spray Each Nare Daily PRN Laverle Hobby, PA-C      . hydrOXYzine (ATARAX/VISTARIL) tablet 25 mg  25 mg Oral Q6H PRN Patriciaann Clan E, PA-C      . loperamide (IMODIUM) capsule 2-4 mg  2-4 mg Oral PRN Laverle Hobby, PA-C      . LORazepam (ATIVAN) tablet 1 mg  1 mg Oral Q6H PRN Laverle Hobby, PA-C   1 mg at 10/24/17 0147  . LORazepam (ATIVAN) tablet 1 mg  1 mg Oral TID Laverle Hobby, PA-C       Followed by  . [START ON 10/26/2017] LORazepam (ATIVAN) tablet 1 mg  1 mg Oral BID Patriciaann Clan E, PA-C       Followed by  . [START ON 10/28/2017] LORazepam (ATIVAN) tablet 1 mg  1 mg Oral Daily Simon, Spencer E, PA-C      . magnesium hydroxide (MILK OF MAGNESIA) suspension 30 mL  30 mL Oral Daily PRN Laverle Hobby, PA-C      . multivitamin with minerals tablet 1 tablet  1 tablet Oral Daily Laverle Hobby, PA-C   1 tablet at 10/25/17 0801  . ondansetron (ZOFRAN-ODT) disintegrating tablet 4 mg  4 mg Oral Q6H PRN Laverle Hobby, PA-C   4 mg at 10/24/17 1059  . potassium chloride SA (K-DUR,KLOR-CON) CR tablet 20 mEq  20 mEq Oral BID Cobos, Myer Peer, MD   20 mEq at 10/25/17 0801  . thiamine (VITAMIN B-1) tablet 100 mg  100 mg Oral Daily Laverle Hobby, PA-C    100 mg at 10/25/17 9150  . traZODone (DESYREL) tablet 100 mg  100 mg Oral QHS Lindell Spar I, NP        Lab Results:  Results for orders placed or performed during the hospital encounter of 10/23/17 (from the past 48 hour(s))  Rapid urine drug screen (hospital performed)     Status: Abnormal   Collection  Time: 10/23/17  5:12 PM  Result Value Ref Range   Opiates NONE DETECTED NONE DETECTED   Cocaine NONE DETECTED NONE DETECTED   Benzodiazepines NONE DETECTED NONE DETECTED   Amphetamines NONE DETECTED NONE DETECTED   Tetrahydrocannabinol POSITIVE (A) NONE DETECTED   Barbiturates NONE DETECTED NONE DETECTED    Comment:        DRUG SCREEN FOR MEDICAL PURPOSES ONLY.  IF CONFIRMATION IS NEEDED FOR ANY PURPOSE, NOTIFY LAB WITHIN 5 DAYS.        LOWEST DETECTABLE LIMITS FOR URINE DRUG SCREEN Drug Class       Cutoff (ng/mL) Amphetamine      1000 Barbiturate      200 Benzodiazepine   409 Tricyclics       811 Opiates          300 Cocaine          300 THC              50   Pregnancy, urine     Status: None   Collection Time: 10/23/17  5:12 PM  Result Value Ref Range   Preg Test, Ur NEGATIVE NEGATIVE    Comment:        THE SENSITIVITY OF THIS METHODOLOGY IS >20 mIU/mL.   Comprehensive metabolic panel     Status: Abnormal   Collection Time: 10/23/17  5:13 PM  Result Value Ref Range   Sodium 138 135 - 145 mmol/L   Potassium 3.3 (L) 3.5 - 5.1 mmol/L   Chloride 104 101 - 111 mmol/L   CO2 25 22 - 32 mmol/L   Glucose, Bld 107 (H) 65 - 99 mg/dL   BUN 12 6 - 20 mg/dL   Creatinine, Ser 0.80 0.44 - 1.00 mg/dL   Calcium 8.7 (L) 8.9 - 10.3 mg/dL   Total Protein 6.8 6.5 - 8.1 g/dL   Albumin 3.6 3.5 - 5.0 g/dL   AST 16 15 - 41 U/L   ALT 17 14 - 54 U/L   Alkaline Phosphatase 57 38 - 126 U/L   Total Bilirubin 0.4 0.3 - 1.2 mg/dL   GFR calc non Af Amer >60 >60 mL/min   GFR calc Af Amer >60 >60 mL/min    Comment: (NOTE) The eGFR has been calculated using the CKD EPI equation. This  calculation has not been validated in all clinical situations. eGFR's persistently <60 mL/min signify possible Chronic Kidney Disease.    Anion gap 9 5 - 15  cbc     Status: Abnormal   Collection Time: 10/23/17  5:13 PM  Result Value Ref Range   WBC 6.5 4.0 - 10.5 K/uL   RBC 4.34 3.87 - 5.11 MIL/uL   Hemoglobin 11.8 (L) 12.0 - 15.0 g/dL   HCT 36.0 36.0 - 46.0 %   MCV 82.9 78.0 - 100.0 fL   MCH 27.2 26.0 - 34.0 pg   MCHC 32.8 30.0 - 36.0 g/dL   RDW 15.7 (H) 11.5 - 15.5 %   Platelets 308 150 - 400 K/uL  Troponin I     Status: None   Collection Time: 10/23/17  5:13 PM  Result Value Ref Range   Troponin I <0.03 <0.03 ng/mL  Ethanol     Status: Abnormal   Collection Time: 10/23/17  5:15 PM  Result Value Ref Range   Alcohol, Ethyl (B) 25 (H) <10 mg/dL    Comment:        LOWEST DETECTABLE LIMIT FOR SERUM ALCOHOL IS 10 mg/dL FOR  MEDICAL PURPOSES ONLY   Salicylate level     Status: None   Collection Time: 10/23/17  6:02 PM  Result Value Ref Range   Salicylate Lvl <0.1 2.8 - 30.0 mg/dL  Acetaminophen level     Status: Abnormal   Collection Time: 10/23/17  6:02 PM  Result Value Ref Range   Acetaminophen (Tylenol), Serum <10 (L) 10 - 30 ug/mL    Comment:        THERAPEUTIC CONCENTRATIONS VARY SIGNIFICANTLY. A RANGE OF 10-30 ug/mL MAY BE AN EFFECTIVE CONCENTRATION FOR MANY PATIENTS. HOWEVER, SOME ARE BEST TREATED AT CONCENTRATIONS OUTSIDE THIS RANGE. ACETAMINOPHEN CONCENTRATIONS >150 ug/mL AT 4 HOURS AFTER INGESTION AND >50 ug/mL AT 12 HOURS AFTER INGESTION ARE OFTEN ASSOCIATED WITH TOXIC REACTIONS.     Blood Alcohol level:  Lab Results  Component Value Date   ETH 25 (H) 74/94/4967    Metabolic Disorder Labs: Lab Results  Component Value Date   HGBA1C 5.7 11/03/2009   No results found for: PROLACTIN Lab Results  Component Value Date   CHOL 119 11/03/2009   TRIG 47.0 11/03/2009   HDL 36.70 (L) 11/03/2009   CHOLHDL 3 11/03/2009   VLDL 9.4 11/03/2009    LDLCALC 73 11/03/2009   LDLCALC 62 05/06/2009    Physical Findings: AIMS: Facial and Oral Movements Muscles of Facial Expression: None, normal Lips and Perioral Area: None, normal Jaw: None, normal Tongue: None, normal,Extremity Movements Upper (arms, wrists, hands, fingers): None, normal Lower (legs, knees, ankles, toes): None, normal, Trunk Movements Neck, shoulders, hips: None, normal, Overall Severity Severity of abnormal movements (highest score from questions above): None, normal Incapacitation due to abnormal movements: None, normal Patient's awareness of abnormal movements (rate only patient's report): No Awareness, Dental Status Current problems with teeth and/or dentures?: No Does patient usually wear dentures?: No  CIWA:  CIWA-Ar Total: 0 COWS:  COWS Total Score: 0  Musculoskeletal: Strength & Muscle Tone: within normal limits Gait & Station: normal Patient leans: N/A  Psychiatric Specialty Exam: Physical Exam  Nursing note and vitals reviewed.   Review of Systems  Psychiatric/Behavioral: Positive for depression ("Improving") and substance abuse (Alcoholism). Negative for hallucinations, memory loss and suicidal ideas. The patient is nervous/anxious ("Improving") and has insomnia ("Improving").     Blood pressure (!) 137/93, pulse 86, temperature 99.3 F (37.4 C), temperature source Oral, resp. rate 18, height 5' 6"  (1.676 m), weight 99.8 kg (220 lb), last menstrual period 10/16/2017, SpO2 100 %, unknown if currently breastfeeding.Body mass index is 35.51 kg/m.  General Appearance: Well Groomed  Eye Contact:  Good  Speech:  Normal Rate  Volume:  Normal  Mood:  depression, states she is feeling better today  Affect:  mildly constricted and anxious, but fully reactive   Thought Process:  Linear and Descriptions of Associations: Intact  Orientation:  Full (Time, Place, and Person)  Thought Content:  no hallucinations, no delusions, not internally preoccupied    Suicidal Thoughts:  No denies suicidal or self injurious ideations, denies homicidal or violent ideations, contracts for safety on unit   Homicidal Thoughts:  No  Memory:  recent and remote grossly intact   Judgement:  Fair  Insight:  Fair  Psychomotor Activity:  Normal- minimal distal tremors at this time, no restlessness   Concentration:  Concentration: Good and Attention Span: Good  Recall:  Good  Fund of Knowledge:  Good  Language:  Good  Akathisia:  Negative  Handed:  Right  AIMS (if indicated):     Assets:  Desire for Improvement Resilience  ADL's:  Intact  Cognition:  WNL  Sleep:  Number of Hours: 6.25     Assessment: Patient is approaching her baseline. No evidence of psychosis. No evidence of mania. No dangerousness to self or other. We are finalizing aftercare as her treatment continue.  Treatment Plan Summary: Daily contact with patient to assess and evaluate symptoms and progress in treatment  Will continue today 10/25/2017 plan as below except where it is noted. Alcohol detox   -Continue the Ativan detox regimen as already in progress. Depression   -Continue Lexapro 10 mg po daily. Insomnia    -Continue Trazodone 100 mg po Q hs.  Medical issues.  Allergies: Continue Flonase nasal spray per nostril prn. Asthma: Continue the rescue albuterol inhaler 2 puffs Q 6 hrs prn. Low K:  Continue the Kdur 20 meq bid x 3 days.  Encourage group attendance & participation. SW to continue to work on the discharge disposition.  Encarnacion Slates, NP, PMHNP, FNP-BC 10/25/2017, 4:00 PM   Agree with NP Progress Note

## 2017-10-25 NOTE — Progress Notes (Signed)
Nursing Progress Note [id number]  D) Patient presents pleasant calm and cooperative this evening. Patient attended group and is observed up in the dayroom. Patient requested to switch rooms this evening but was informed no open beds at this time. Patient verbalized understanding. Patient states, "my roommate is very messy and I am OCD but we will be fine". Patient reports she is hopeful to discharge soon saying "I feel the best I have in a week. I miss my children". Patient denies SI/HI/AVH or pain. Patient contracts for safety on the unit. Patient reports sleeping well with current regimen. Patient compliant with medications. Patient CIWA score is a 1 for mild anxiety, patient denies withdrawal symptoms at this time.  A) Emotional support given. 1:1 interaction and active listening provided. Patient medicated as prescribed. Medications and plan of care reviewed with patient. Patient verbalized understanding without further questions. Snacks and fluids provided. Opportunities for questions or concerns presented to patient. Patient encouraged to continue to work on treatment goals. Labs, vital signs and patient behavior monitored throughout shift. Patient safety maintained with q15 min safety checks. Low fall risk precautions in place and reviewed with patient; patient verbalized understanding.  R) Patient receptive to interaction with nurse. Patient remains safe on the unit at this time. Patient denies any adverse medication reactions at this time. Patient is resting in bed without complaints. Will continue to monitor.

## 2017-10-25 NOTE — Plan of Care (Signed)
Safety Care Plan Documentation  Goal: Periods of time without injury will increase Intervention: Patient is on q15 minute safety checks and low fall risk precautions. Patient verbally contracts for safety on the unit. Outcome: Patient remains safe at this time  10/25/2017 12:13 AM - Progressing by Annia Friendly, RN   Health Behavior/Discharge Planning Care Plan Documentation  Goal: Compliance for underlying treatment cause of condition will improve Intervention: Encourage compliance with groups/medication regimen.  Outcome: Patient taking medications and attending groups per plan of care. Patient verbalizes understanding and is agreeable to current plan of care.  10/25/2017 12:13 AM - Progressing by Annia Friendly, RN   Activity Care Plan Documentation  Goal: Sleeping Patterns Will Improve Intervention: Provide sleep medications as needed and provide uninterrupted periods of sleep. Outcome: Patient is resting in bed with eyes closed; respirations even and unlabored. Patient reports sleeping well with current regimen. MHT observes patient resting during q15 minute checks.  10/25/2017 12:13 AM - Progressing by Annia Friendly, RN

## 2017-10-26 MED ORDER — TRAZODONE HCL 100 MG PO TABS: 100.0000 mg | ORAL_TABLET | Freq: Every day | ORAL | 0 refills | Status: DC

## 2017-10-26 MED ORDER — ACAMPROSATE CALCIUM 333 MG PO TBEC: 666.0000 mg | DELAYED_RELEASE_TABLET | Freq: Three times a day (TID) | ORAL | 0 refills | Status: DC

## 2017-10-26 MED ORDER — HYDROXYZINE HCL 25 MG PO TABS
25.0000 mg | ORAL_TABLET | Freq: Three times a day (TID) | ORAL | Status: DC | PRN
  Filled 2017-10-26: qty 10

## 2017-10-26 MED ORDER — FLUTICASONE PROPIONATE 50 MCG/ACT NA SUSP: 2.0000 | Freq: Every day | NASAL | Status: DC | PRN

## 2017-10-26 MED ORDER — ACAMPROSATE CALCIUM 333 MG PO TBEC
666.0000 mg | DELAYED_RELEASE_TABLET | Freq: Three times a day (TID) | ORAL | Status: DC
  Administered 2017-10-26: 666 mg via ORAL
  Filled 2017-10-26: qty 2
  Filled 2017-10-26: qty 42
  Filled 2017-10-26: qty 2
  Filled 2017-10-26: qty 42
  Filled 2017-10-26 (×2): qty 2
  Filled 2017-10-26: qty 42

## 2017-10-26 MED ORDER — ESCITALOPRAM OXALATE 10 MG PO TABS: 10.0000 mg | ORAL_TABLET | Freq: Every day | ORAL | 0 refills | Status: DC

## 2017-10-26 MED ORDER — HYDROXYZINE HCL 25 MG PO TABS: ORAL_TABLET | ORAL | 0 refills | Status: DC

## 2017-10-26 MED ORDER — ALBUTEROL SULFATE HFA 108 (90 BASE) MCG/ACT IN AERS: 2.0000 | INHALATION_SPRAY | Freq: Four times a day (QID) | RESPIRATORY_TRACT | Status: DC | PRN

## 2017-10-26 NOTE — Progress Notes (Signed)
Pt d/c from the hospital. All items returned. D/C instructions given, prescriptions given and samples given. Pt denies si and hi.

## 2017-10-26 NOTE — Discharge Summary (Signed)
Physician Discharge Summary Note  Patient:  Elizabeth David is an 26 y.o., female  MRN:  [id number]  DOB:  1991/06/05  Patient phone:  [phone] (home)   Patient address:   [place] [address],  Total Time spent with patient: Greater than 30 minutes  Date of Admission:  10/24/2017 Date of Discharge: 10-26-17  Reason for Admission: Increased alcohol consumption & worsening symptoms of depression.  Principal Problem: Substance induced mood disorder [facility])  Discharge Diagnoses: Patient Active Problem List   Diagnosis Date Noted  . Substance induced mood disorder ([place]) [F19.94] 10/24/2017    Priority: High  . Active labor [IMO0001] 12/04/2014  . SVD (spontaneous vaginal delivery) [O80] 12/04/2014  . VAGINAL DISCHARGE [N89.8] 06/03/2010  . FREQUENCY, URINARY [R35.0] 06/03/2010  . BACK STRAIN, LUMBAR [S33.5XXA] 06/03/2010  . MORBID OBESITY [E66.01] 11/03/2009  . VITAMIN B12 DEFICIENCY [E53.8] 05/14/2009  . FATIGUE [R53.81, R53.83] 05/06/2009  . DYSURIA [R30.0] 05/06/2009  . PAIN IN JOINT, ANKLE AND FOOT [M25.579] 08/13/2008   Past Psychiatric History: Substance induced mood disorder  Past Medical History:  Past Medical History:  Diagnosis Date  . Active labor 12/04/2014  . Alcohol abuse   . Anemia   . Asthma    as child  . Genital herpes   . SVD (spontaneous vaginal delivery) 12/04/2014    Past Surgical History:  Procedure Laterality Date  . TONSILLECTOMY     at 26 years of age   Family History:  Family History  Problem Relation Age of Onset  . Hypertension Mother   . Diabetes Mother   . Heart failure Other    Family Psychiatric  History: See H&P Social History:  Social History   Substance and Sexual Activity  Alcohol Use Yes   Comment: 1 pint of liquor daily     Social History   Substance and Sexual Activity  Drug Use No    Social History   Socioeconomic History  . Marital status: Married    Spouse name: None  .  Number of children: None  . Years of education: None  . Highest education level: None  Social Needs  . Financial resource strain: None  . Food insecurity - worry: None  . Food insecurity - inability: None  . Transportation needs - medical: None  . Transportation needs - non-medical: None  Occupational History  . None  Tobacco Use  . Smoking status: Former Smoker    Packs/day: 0.00    Years: 3.00    Pack years: 0.00    Types: Cigarettes    Last attempt to quit: 06/16/2012    Years since quitting: 5.3  . Smokeless tobacco: Never Used  Substance and Sexual Activity  . Alcohol use: Yes    Comment: 1 pint of liquor daily  . Drug use: No  . Sexual activity: Yes    Partners: Male    Birth control/protection: None  Other Topics Concern  . None  Social History Narrative  . None   Hospital Course: 26 year old female, history of alcohol dependence. Reports she had been drinking daily, heavily, up to 2 pints of liquor per day. States she feels her alcohol consumption has been worsening, and reports she has missed days at work due to alcohol and that boyfriend has expressed concern about her drinking. She also reports worsening depression, and reports pervasive sense of sadness, depression, easily overwhelmed. She also endorses significant anxiety. She denies suicidal ideations , but reports episodes of  intermittent  superficial cutting . States she has been cutting  (superficial scratches )intermittently x 3 years, when feeling overwhelmed. Admission BAL 25, Admission UDS (+) for cannabis.   [place] was admitted to the hospital for alcohol detoxification treatment. Her blood alcohol level upon admission was 25 per toxicology tests reports & UDS positive for cannabis. She was intoxicated. Her detoxification treatment was achieved using Ativan detoxification regimen on a tapering dose format.  Besides the detoxification treatments, Elizabeth David also was medicated and discharged on; Lexapro 10 mg for  depression, Hydroxyzine 25 mg prn for anxiety & Trazodone 100 mg for insomnia. She received other medication regimen for her other medical issues that she presented. She tolerated her treatment regimen without any significant adverse effects and or reactions reported. She participated in the AA/NA meetings and group counseling sessions being offered and held on this unit. She learned coping skills.  Elizabeth David has completed detox treatment & her mood is stabilized. She is currently mentally & medically stable for discharge to continue mental health care & substance abuse treatment on an outpatient basis as noted below. She has been given all the necessary information needed to make these appointments without problems. Upon discharge, she denies any SIHI, AVH, delusional thoughts, paranoia & or substance withdrawal symptoms. She received some samples of her discharge medicines. She left [facility] with all personal belongings in no distress. Transportation per friend.   Consults:  psychiatry   Physical Findings: AIMS: Facial and Oral Movements Muscles of Facial Expression: None, normal Lips and Perioral Area: None, normal Jaw: None, normal Tongue: None, normal,Extremity Movements Upper (arms, wrists, hands, fingers): None, normal Lower (legs, knees, ankles, toes): None, normal, Trunk Movements Neck, shoulders, hips: None, normal, Overall Severity Severity of abnormal movements (highest score from questions above): None, normal Incapacitation due to abnormal movements: None, normal Patient's awareness of abnormal movements (rate only patient's report): No Awareness, Dental Status Current problems with teeth and/or dentures?: No Does patient usually wear dentures?: No  CIWA:  CIWA-Ar Total: 3 COWS:  COWS Total Score: 0  Musculoskeletal: Strength & Muscle Tone: within normal limits Gait & Station: normal Patient leans: N/A  Psychiatric Specialty Exam: Physical Exam  Nursing note and vitals  reviewed.   Review of Systems  Constitutional: Negative.   HENT: Negative.   Eyes: Negative.   Respiratory: Negative.   Cardiovascular: Negative.   Gastrointestinal: Negative.   Genitourinary: Negative.   Musculoskeletal: Negative.   Skin: Negative.   Neurological: Negative.   Endo/Heme/Allergies: Negative.   Psychiatric/Behavioral: Positive for depression (Stable) and substance abuse (HX. Alcohol & THC use disorder). Negative for hallucinations, memory loss and suicidal ideas. The patient has insomnia (Stable). The patient is not nervous/anxious.     Blood pressure (!) 137/91, pulse (!) 106, temperature 98.3 F (36.8 C), temperature source Oral, resp. rate 16, height 5\' 6"  (1.676 m), weight 99.8 kg (220 lb), last menstrual period 10/16/2017, SpO2 100 %, unknown if currently breastfeeding.Body mass index is 35.51 kg/m.  See H&P   Have you used any form of tobacco in the last 30 days? (Cigarettes, Smokeless Tobacco, Cigars, and/or Pipes): No  Has this patient used any form of tobacco in the last 30 days? (Cigarettes, Smokeless Tobacco, Cigars, and/or Pipes): N/A  Blood Alcohol level:  Lab Results  Component Value Date   ETH 25 (H) 48/54/6270   Metabolic Disorder Labs:  Lab Results  Component Value Date   HGBA1C 5.7 11/03/2009   No results found for: PROLACTIN Lab Results  Component  Value Date   CHOL 119 11/03/2009   TRIG 47.0 11/03/2009   HDL 36.70 (L) 11/03/2009   CHOLHDL 3 11/03/2009   VLDL 9.4 11/03/2009   LDLCALC 73 11/03/2009   LDLCALC 62 05/06/2009   See Psychiatric Specialty Exam and Suicide Risk Assessment completed by Attending Physician prior to discharge.  Discharge destination:  Home  Is patient on multiple antipsychotic therapies at discharge:  No   Has Patient had three or more failed trials of antipsychotic monotherapy by history:  No  Recommended Plan for Multiple Antipsychotic Therapies: NA  Allergies as of 10/26/2017   No Known Allergies      Medication List    STOP taking these medications   acyclovir 400 MG tablet Commonly known as:  ZOVIRAX   cetirizine-pseudoephedrine 5-120 MG tablet Commonly known as:  ZYRTEC-D     TAKE these medications     Indication  albuterol 108 (90 Base) MCG/ACT inhaler Commonly known as:  PROVENTIL HFA;VENTOLIN HFA Inhale 2 puffs every 6 (six) hours as needed into the lungs for wheezing or shortness of breath.  Indication:  Asthma   escitalopram 10 MG tablet Commonly known as:  LEXAPRO Take 1 tablet (10 mg total) daily by mouth. For depression Start taking on:  10/27/2017  Indication:  Major Depressive Disorder   fluticasone 50 MCG/ACT nasal spray Commonly known as:  FLONASE Place 2 sprays daily as needed into both nostrils for allergies.  Indication:  Signs and Symptoms of Nose Diseases   hydrOXYzine 25 MG tablet Commonly known as:  ATARAX/VISTARIL Take 1 tablet (25 mg) three times daily as needed: For anxiety  Indication:  Feeling Anxious   traZODone 100 MG tablet Commonly known as:  DESYREL Take 1 tablet (100 mg total) at bedtime by mouth. For sleep  Indication:  Trouble Sleeping      Follow-up Information    Services, Alcohol And Drug .   Specialty:  [facility] information: [place] [address] [place] [address] [phone]          Follow-up recommendations: Activity:  As tolerated Diet: As recommended by your primary care doctor. Keep all scheduled follow-up appointments as recommended.  Comments: Patient is instructed prior to discharge to: Take all medications as prescribed by his/her mental healthcare provider. Report any adverse effects and or reactions from the medicines to his/her outpatient provider promptly. Patient has been instructed & cautioned: To not engage in alcohol and or illegal drug use while on prescription medicines. In the event of worsening symptoms, patient is instructed to call the crisis hotline, 911 and or  go to the nearest ED for appropriate evaluation and treatment of symptoms. To follow-up with his/her primary care provider for your other medical issues, concerns and or health care needs.   Signed: Encarnacion Slates, NP, PMHNP, FNP-BC 10/26/2017, 10:36 AM    Patient seen, Suicide Assessment Completed.  Disposition Plan Reviewed

## 2017-10-26 NOTE — Progress Notes (Signed)
  [facility] Adult Case Management Discharge Plan :  Will you be returning to the same living situation after discharge:  Yes,  home At discharge, do you have transportation home?: Yes,  friend Do you have the ability to pay for your medications: Yes,  mental health--per Lars Pinks, CM, pt's insurance has no behavioral health coverage.   Release of information consent forms completed and submitted to medical records by CSW.  Patient to Follow up at: Follow-up Information    Services, Alcohol And Drug Follow up on 10/29/2017.   Specialty:  Behavioral Health Why:  Please walk in within 3 days of discharge from the hospital for assessment. Walk in hours: Mon, Wed, Fri between 12:30PM-3:00PM. Please bring photo ID. Thank you.  Contact information: [address] [place] [address] [phone]           Next level of care provider has access to [place] and Suicide Prevention discussed: Yes,  SPE completed with pt; contact attempts made with pt's boyfriend. SPI pamphlet and Mobile Crisis information provided to pt.   Have you used any form of tobacco in the last 30 days? (Cigarettes, Smokeless Tobacco, Cigars, and/or Pipes): No  Has patient been referred to the Quitline?: N/A patient is not a smoker  Patient has been referred for addiction treatment: Yes  [employer], LCSW 10/26/2017, 10:37 AM

## 2017-10-26 NOTE — Progress Notes (Signed)
Recreation Therapy Notes  Date: 10/26/17 Time: 0930 Location: [address] Dayroom  Group Topic: Stress Management  Goal Area(s) Addresses:  Patient will verbalize importance of using healthy stress management.  Patient will identify positive emotions associated with healthy stress management.   Behavioral Response: Engaged  Intervention: Stress Management  Activity :  Insurance claims handler.  LRT introduced the stress management technique of meditation.  LRT lead patients in a meditation that allowed them to explore the things that make them who they are.  Education:  Stress Management, Discharge Planning.   Education Outcome: Acknowledges edcuation/In group clarification offered/Needs additional education  Clinical Observations/Feedback: Pt attended group.    Victorino Sparrow, LRT/CTRS         Victorino Sparrow A 10/26/2017 12:19 PM

## 2017-10-26 NOTE — [facility] Suicide Risk Assessment (Signed)
[facility] Discharge Suicide Risk Assessment   Principal Problem: Substance induced mood disorder [facility]) Discharge Diagnoses:  Patient Active Problem List   Diagnosis Date Noted  . Substance induced mood disorder ([place]) [F19.94] 10/24/2017  . Active labor [IMO0001] 12/04/2014  . SVD (spontaneous vaginal delivery) [O80] 12/04/2014  . VAGINAL DISCHARGE [N89.8] 06/03/2010  . FREQUENCY, URINARY [R35.0] 06/03/2010  . BACK STRAIN, LUMBAR [S33.5XXA] 06/03/2010  . MORBID OBESITY [E66.01] 11/03/2009  . VITAMIN B12 DEFICIENCY [E53.8] 05/14/2009  . FATIGUE [R53.81, R53.83] 05/06/2009  . DYSURIA [R30.0] 05/06/2009  . PAIN IN JOINT, ANKLE AND FOOT [M25.579] 08/13/2008    Total Time spent with patient: 30 minutes  Musculoskeletal: Strength & Muscle Tone: within normal limits no restlessness, no psychomotor agitation, no tremors  Gait & Station: normal Patient leans: N/A  Psychiatric Specialty Exam: ROS mild headache, no chest pain, no shortness of breath, no vomiting, no diarrhea, no fever, no chills.  Blood pressure (!) 137/91, pulse (!) 106, temperature 98.3 F (36.8 C), temperature source Oral, resp. rate 16, height 5\' 6"  (1.676 m), weight 99.8 kg (220 lb), last menstrual period 10/16/2017, SpO2 100 %, unknown if currently breastfeeding.Body mass index is 35.51 kg/m.  General Appearance: Well Groomed  Eye Contact::  Good  Speech:  Normal Rate409  Volume:  Normal  Mood:  improved, denies depression  Affect:  Appropriate and fuller in range   Thought Process:  Linear and Descriptions of Associations: Intact  Orientation:  Full (Time, Place, and Person)  Thought Content:  denies hallucinations, no delusions, not internally preoccupied   Suicidal Thoughts:  No denies any suicidal or self injurious ideations, denies any homicidal or violent ideations   Homicidal Thoughts:  No  Memory:  recent and remote grossly intact   Judgement:  Other:  improved   Insight:  Present  Psychomotor Activity:   Normal  Concentration:  Good  Recall:  Good  Fund of Knowledge:Good  Language: Good  Akathisia:  Negative  Handed:  Right  AIMS (if indicated):     Assets:  Communication Skills Desire for Improvement Resilience  Sleep:  Number of Hours: 6.75  Cognition: WNL  ADL's:  Intact   Mental Status Per Nursing Assessment::   On Admission:  NA  Demographic Factors:  26 year old single female, employed , has two children, who are currently with their father  Loss Factors: Alcohol abuse/relapse   Historical Factors: No prior psychiatric admissions, no history of suicide attempts, history of alcohol use disorder   Risk Reduction Factors:   Sense of responsibility to family, Employed, Positive therapeutic relationship and Positive coping skills or problem solving skills  Continued Clinical Symptoms:  Alert, attentive, well related, mood improved ,affect appropriate, no thought disorder, no suicidal or self injurious ideations, no homicidal or violent ideations , no psychotic symptoms , future oriented . Denies medication side effects. We discussed medication options for alcohol dependence, to include Antabuse, Naltrexone, Campral- reviewed side effects. Interested in Castor. Will start prior to discharge. Behavior on unit in good control. Pleasant on approach.   Cognitive Features That Contribute To Risk:  Closed-mindedness and Loss of executive function    Suicide Risk:  Mild:  Suicidal ideation of limited frequency, intensity, duration, and specificity.  There are no identifiable plans, no associated intent, mild dysphoria and related symptoms, good self-control (both objective and subjective assessment), few other risk factors, and identifiable protective factors, including available and accessible social support.  Follow-up Information    Services, Alcohol [facility] Drug Follow up  on 10/29/2017.   Specialty:  Behavioral Health Why:  Please walk in within 3 days of discharge from the  hospital for assessment. Walk in hours: Mon, Wed, Fri between 12:30PM-3:00PM. Please bring photo ID. Thank you.  Contact information: [address] [place] [address] [phone]           Plan Of Care/Follow-up recommendations:  Activity:  as tolerated  Diet:  regular Tests:  NA Other:  see below  Patient is expressing readiness for discharge and is leaving in good spirits. Plans to return home. Plans to follow up as above . Patient plans to go to [place] meetings, and we discussed importance of avoiding people, places and situations that she associates with alcohol in order to decrease cravings/triggers for relapse .  Jenne Campus, MD 10/26/2017, 11:11 AM

## 2017-12-24 ENCOUNTER — Emergency Department (HOSPITAL_COMMUNITY)
Admission: EM | Admit: 2017-12-24 | Discharge: 2017-12-24 | Disposition: A | Discharge status: Home / Self Care | Payer: No Typology Code available for payment source | Attending: Emergency Medicine | Admitting: Emergency Medicine

## 2017-12-24 ENCOUNTER — Encounter (HOSPITAL_COMMUNITY): Payer: Self-pay

## 2017-12-24 ENCOUNTER — Other Ambulatory Visit: Payer: Self-pay

## 2017-12-24 ENCOUNTER — Emergency Department (HOSPITAL_COMMUNITY): Payer: No Typology Code available for payment source

## 2017-12-24 DIAGNOSIS — Z87891 Personal history of nicotine dependence: Secondary | ICD-10-CM | POA: Insufficient documentation

## 2017-12-24 DIAGNOSIS — Z79899 Other long term (current) drug therapy: Secondary | ICD-10-CM | POA: Insufficient documentation

## 2017-12-24 DIAGNOSIS — J111 Influenza due to unidentified influenza virus with other respiratory manifestations: Secondary | ICD-10-CM | POA: Insufficient documentation

## 2017-12-24 DIAGNOSIS — R6889 Other general symptoms and signs: Secondary | ICD-10-CM

## 2017-12-24 LAB — RAPID STREP SCREEN (MED CTR MEBANE ONLY): Streptococcus, Group A Screen (Direct): NEGATIVE

## 2017-12-24 MED ORDER — OSELTAMIVIR PHOSPHATE 75 MG PO CAPS: 75.0000 mg | ORAL_CAPSULE | Freq: Two times a day (BID) | ORAL | 0 refills | Status: DC

## 2017-12-24 NOTE — ED Provider Notes (Signed)
[facility] EMERGENCY DEPARTMENT Provider Note   CSN: [id number] Arrival date & time: 12/24/17  4970     History   Chief Complaint Chief Complaint  Patient presents with  . Cough  . Nasal Congestion    HPI Elizabeth David is a 27 y.o. female.  The history is provided by the patient.  URI   This is a new problem. Episode onset: 4 days ago. The problem has been gradually worsening. Maximum temperature: unsure hasn't measured. Associated symptoms include congestion, ear pain, headaches, plugged ear sensation, rhinorrhea, sinus pain, sore throat and cough. Pertinent negatives include no chest pain, no nausea, no vomiting, no rash and no wheezing. Associated symptoms comments: Myalgias.  Patient just received a call from her daughter's physician that said she has the flu. Treatments tried: flonase. The treatment provided no relief.    Past Medical History:  Diagnosis Date  . Active labor 12/04/2014  . Alcohol abuse   . Anemia   . Asthma    as child  . Genital herpes   . SVD (spontaneous vaginal delivery) 12/04/2014    Patient Active Problem List   Diagnosis Date Noted  . Substance induced mood disorder ([place]) 10/24/2017  . Active labor 12/04/2014  . SVD (spontaneous vaginal delivery) 12/04/2014  . VAGINAL DISCHARGE 06/03/2010  . FREQUENCY, URINARY 06/03/2010  . BACK STRAIN, LUMBAR 06/03/2010  . MORBID OBESITY 11/03/2009  . VITAMIN B12 DEFICIENCY 05/14/2009  . FATIGUE 05/06/2009  . DYSURIA 05/06/2009  . PAIN IN JOINT, ANKLE AND FOOT 08/13/2008    Past Surgical History:  Procedure Laterality Date  . TONSILLECTOMY     at 27 years of age    20 History    46 Para Term Preterm AB Living   3 2 1 1 1 2    SAB TAB Ectopic Multiple Live Births   1   0 0 2       Home Medications    Prior to Admission medications   Medication Sig Start Date End Date Taking? Authorizing Provider  acamprosate (CAMPRAL) 333 MG tablet Take 2 tablets (666 mg total)  3 (three) times daily with meals by mouth. For alcoholism 10/26/17   Lindell Spar I, NP  albuterol (PROVENTIL HFA;VENTOLIN HFA) 108 (90 Base) MCG/ACT inhaler Inhale 2 puffs every 6 (six) hours as needed into the lungs for wheezing or shortness of breath. 10/26/17   Lindell Spar I, NP  escitalopram (LEXAPRO) 10 MG tablet Take 1 tablet (10 mg total) daily by mouth. For depression 10/27/17   Lindell Spar I, NP  fluticasone (FLONASE) 50 MCG/ACT nasal spray Place 2 sprays daily as needed into both nostrils for allergies. 10/26/17   Lindell Spar I, NP  hydrOXYzine (ATARAX/VISTARIL) 25 MG tablet Take 1 tablet (25 mg) three times daily as needed: For anxiety 10/26/17   Lindell Spar I, NP  traZODone (DESYREL) 100 MG tablet Take 1 tablet (100 mg total) at bedtime by mouth. For sleep 10/26/17   Lindell Spar I, NP    Family History Family History  Problem Relation Age of Onset  . Hypertension Mother   . Diabetes Mother   . Heart failure Other     Social History Social History   Tobacco Use  . Smoking status: Former Smoker    Packs/day: 0.00    Years: 3.00    Pack years: 0.00    Types: Cigarettes    Last attempt to quit: 06/16/2012    Years since quitting: 5.5  . Smokeless  tobacco: Never Used  Substance Use Topics  . Alcohol use: Yes    Comment: 1 pint of liquor daily  . Drug use: No     Allergies   Patient has no known allergies.   Review of Systems Review of Systems  HENT: Positive for congestion, ear pain, rhinorrhea, sinus pain and sore throat.   Respiratory: Positive for cough. Negative for wheezing.   Cardiovascular: Negative for chest pain.  Gastrointestinal: Negative for nausea and vomiting.  Skin: Negative for rash.  Neurological: Positive for headaches.  All other systems reviewed and are negative.    Physical Exam Updated Vital Signs BP (!) 138/92 (BP Location: Right Arm)   Pulse (!) 118   Temp 98.9 F (37.2 C) (Oral)   Resp 20   LMP 12/05/2017   SpO2 97%    Physical Exam  Constitutional: She is oriented to person, place, and time. She appears well-developed and well-nourished. No distress.  HENT:  Head: Normocephalic and atraumatic.  Right Ear: A middle ear effusion is present.  Left Ear: Tympanic membrane normal.  Nose: Mucosal edema and rhinorrhea present.  Mouth/Throat: Mucous membranes are normal. Posterior oropharyngeal erythema present. No oropharyngeal exudate or posterior oropharyngeal edema.  Eyes: Conjunctivae and EOM are normal. Pupils are equal, round, and reactive to light.  Neck: Normal range of motion. Neck supple.  Cardiovascular: Regular rhythm and intact distal pulses. Tachycardia present.  No murmur heard. Pulmonary/Chest: Effort normal and breath sounds normal. No respiratory distress. She has no wheezes. She has no rales.  Abdominal: Soft. She exhibits no distension. There is no tenderness. There is no rebound and no guarding.  Musculoskeletal: Normal range of motion. She exhibits no edema or tenderness.  Neurological: She is alert and oriented to person, place, and time.  Skin: Skin is warm and dry. No rash noted. No erythema.  Psychiatric: She has a normal mood and affect. Her behavior is normal.  Nursing note and vitals reviewed.    ED Treatments / Results  Labs (all labs ordered are listed, but only abnormal results are displayed) Labs Reviewed  RAPID STREP SCREEN (NOT AT [facility])  CULTURE, GROUP A STREP [facility])    EKG  EKG Interpretation None       Radiology Dg Chest 2 View  Result Date: 12/24/2017 CLINICAL DATA:  Shortness of breath and cough for 4 days EXAM: CHEST  2 VIEW COMPARISON:  December 04, 2015 FINDINGS: Lungs are clear. Heart size and pulmonary vascularity are normal. No adenopathy. No evident bone lesions. IMPRESSION: No edema or consolidation. Electronically Signed   By: Lowella Grip III M.D.   On: 12/24/2017 08:45    Procedures Procedures (including critical care  time)  Medications Ordered in ED Medications - No data to display   Initial Impression / Assessment and Plan / ED Course  I have reviewed the triage vital signs and the nursing notes.  Pertinent labs & imaging results that were available during my care of the patient were reviewed by me and considered in my medical decision making (see chart for details).     Pt with symptoms consistent with influenza and daughter was recently diagnosed.  Normal exam here and  afebrile.  No signs of breathing difficulty  No signs of strep pharyngitis, otitis or abnormal abdominal findings.   CXR wnl and rapid strep wnl.  Will continue antipyretica and rest and fluids and return for any further problems.  Tamiflu given   Final Clinical Impressions(s) / ED Diagnoses  Final diagnoses:  Flu-like symptoms    ED Discharge Orders        Ordered    oseltamivir (TAMIFLU) 75 MG capsule  Every 12 hours     12/24/17 0928       Blanchie Dessert, MD 12/24/17 [phone]

## 2017-12-24 NOTE — ED Triage Notes (Signed)
Per Pt, Pt is coming from home with complaints of cough, congestion, and ear pain for the past four days.

## 2017-12-24 NOTE — Discharge Instructions (Signed)
Also you can try Netty Pot or Ocean nasal spray to help with your congestion and clear the sinuses.  You can also try NyQuil, Robitussin or some over-the-counter cough and cold medication to help you feel better.  Make sure you are drinking lots of fluids and getting rest.

## 2017-12-26 LAB — CULTURE, GROUP A STREP (THRC)

## 2018-02-04 ENCOUNTER — Emergency Department (HOSPITAL_COMMUNITY)
Admission: EM | Admit: 2018-02-04 | Discharge: 2018-02-05 | Disposition: A | Discharge status: Home / Self Care | Payer: No Typology Code available for payment source | Attending: Emergency Medicine | Admitting: Emergency Medicine

## 2018-02-04 ENCOUNTER — Other Ambulatory Visit: Payer: Self-pay

## 2018-02-04 ENCOUNTER — Encounter (HOSPITAL_COMMUNITY): Payer: Self-pay

## 2018-02-04 ENCOUNTER — Emergency Department (HOSPITAL_COMMUNITY): Payer: No Typology Code available for payment source

## 2018-02-04 DIAGNOSIS — J45909 Unspecified asthma, uncomplicated: Secondary | ICD-10-CM | POA: Insufficient documentation

## 2018-02-04 DIAGNOSIS — D259 Leiomyoma of uterus, unspecified: Secondary | ICD-10-CM

## 2018-02-04 DIAGNOSIS — Z79899 Other long term (current) drug therapy: Secondary | ICD-10-CM | POA: Insufficient documentation

## 2018-02-04 DIAGNOSIS — Z87891 Personal history of nicotine dependence: Secondary | ICD-10-CM | POA: Insufficient documentation

## 2018-02-04 DIAGNOSIS — R102 Pelvic and perineal pain: Secondary | ICD-10-CM

## 2018-02-04 LAB — WET PREP, GENITAL
CLUE CELLS WET PREP: NONE SEEN
SPERM: NONE SEEN
TRICH WET PREP: NONE SEEN
YEAST WET PREP: NONE SEEN

## 2018-02-04 LAB — URINALYSIS, ROUTINE W REFLEX MICROSCOPIC
Bilirubin Urine: NEGATIVE
Glucose, UA: NEGATIVE mg/dL
Ketones, ur: NEGATIVE mg/dL
Nitrite: NEGATIVE
PH: 5 (ref 5.0–8.0)
Protein, ur: 100 mg/dL — AB
SQUAMOUS EPITHELIAL / LPF: NONE SEEN
Specific Gravity, Urine: 1.026 (ref 1.005–1.030)

## 2018-02-04 LAB — POC URINE PREG, ED: PREG TEST UR: NEGATIVE

## 2018-02-04 MED ORDER — OXYCODONE-ACETAMINOPHEN 5-325 MG PO TABS
1.0000 | ORAL_TABLET | ORAL | Status: DC | PRN
  Administered 2018-02-04: 1 via ORAL
  Filled 2018-02-04: qty 1

## 2018-02-04 NOTE — ED Notes (Signed)
Pain medication given in Triage. Patient advised about side effects of medications and  to avoid driving for a minimum of 4 hours.  Pt verbalizes understanding.

## 2018-02-04 NOTE — ED Triage Notes (Signed)
Pt presents to the ed with complaints of vaginal burning and itching x [redacted] weeks along with heavier periods x 2 months.

## 2018-02-04 NOTE — ED Provider Notes (Signed)
[facility] EMERGENCY DEPARTMENT Provider Note   CSN: [id number] Arrival date & time: 02/04/18  1352     History   Chief Complaint Chief Complaint  Patient presents with  . Vaginal Bleeding    HPI Elizabeth David is a 27 y.o. female.  The history is provided by the patient and medical records. No language interpreter was used.  Vaginal Bleeding  Primary symptoms include pelvic pain, dysuria, and vaginal bleeding. Associated symptoms include abdominal pain. Pertinent negatives include no constipation, no diarrhea, no nausea and no vomiting.   Elizabeth David is a 27 y.o. female  with a PMH of genital herpes who presents to the Emergency Department complaining of pelvic pain associated with vaginal itching and burning.  This started yesterday.  Patient does report history of genital herpes and has prescription for antiviral which she started taking this morning just in case this was going to lead to herpes outbreak.  She typically has menstrual cycles regularly, but her last 2 cycles are 4 days late and much heavier than usual.  She started her cycle earlier today and is experiencing lower abdominal cramping.  She took 600 mg ibuprofen and extra strength Tylenol which helped for about an hour but then symptoms returned.  She does report dysuria over the last 2 days as well.  Mild amount of white discharge after intercourse too.  No nausea, vomiting, back pain, fever or chills.  Past Medical History:  Diagnosis Date  . Active labor 12/04/2014  . Alcohol abuse   . Anemia   . Asthma    as child  . Genital herpes   . SVD (spontaneous vaginal delivery) 12/04/2014    Patient Active Problem List   Diagnosis Date Noted  . Substance induced mood disorder ([place]) 10/24/2017  . Active labor 12/04/2014  . SVD (spontaneous vaginal delivery) 12/04/2014  . VAGINAL DISCHARGE 06/03/2010  . FREQUENCY, URINARY 06/03/2010  . BACK STRAIN, LUMBAR 06/03/2010  . MORBID OBESITY  11/03/2009  . VITAMIN B12 DEFICIENCY 05/14/2009  . FATIGUE 05/06/2009  . DYSURIA 05/06/2009  . PAIN IN JOINT, ANKLE AND FOOT 08/13/2008    Past Surgical History:  Procedure Laterality Date  . TONSILLECTOMY     at 27 years of age    44 History    72 Para Term Preterm AB Living   3 2 1 1 1 2    SAB TAB Ectopic Multiple Live Births   1   0 0 2       Home Medications    Prior to Admission medications   Medication Sig Start Date End Date Taking? Authorizing Provider  PRESCRIPTION MEDICATION Take 1 tablet by mouth daily. For herpes   Yes [provider]  acamprosate (CAMPRAL) 333 MG tablet Take 2 tablets (666 mg total) 3 (three) times daily with meals by mouth. For alcoholism Patient not taking: Reported on 02/04/2018 10/26/17   Lindell Spar I, NP  albuterol (PROVENTIL HFA;VENTOLIN HFA) 108 (90 Base) MCG/ACT inhaler Inhale 2 puffs every 6 (six) hours as needed into the lungs for wheezing or shortness of breath. Patient not taking: Reported on 02/04/2018 10/26/17   Lindell Spar I, NP  escitalopram (LEXAPRO) 10 MG tablet Take 1 tablet (10 mg total) daily by mouth. For depression Patient not taking: Reported on 02/04/2018 10/27/17   Lindell Spar I, NP  fluticasone (FLONASE) 50 MCG/ACT nasal spray Place 2 sprays daily as needed into both nostrils for allergies. Patient not taking: Reported on 02/04/2018 10/26/17  Lindell Spar I, NP  hydrOXYzine (ATARAX/VISTARIL) 25 MG tablet Take 1 tablet (25 mg) three times daily as needed: For anxiety Patient not taking: Reported on 02/04/2018 10/26/17   Lindell Spar I, NP  oseltamivir (TAMIFLU) 75 MG capsule Take 1 capsule (75 mg total) by mouth every 12 (twelve) hours. Patient not taking: Reported on 02/04/2018 12/24/17   Blanchie Dessert, MD  traZODone (DESYREL) 100 MG tablet Take 1 tablet (100 mg total) at bedtime by mouth. For sleep Patient not taking: Reported on 02/04/2018 10/26/17   Lindell Spar I, NP    Family History Family  History  Problem Relation Age of Onset  . Hypertension Mother   . Diabetes Mother   . Heart failure Other     Social History Social History   Tobacco Use  . Smoking status: Former Smoker    Packs/day: 0.00    Years: 3.00    Pack years: 0.00    Types: Cigarettes    Last attempt to quit: 06/16/2012    Years since quitting: 5.6  . Smokeless tobacco: Never Used  Substance Use Topics  . Alcohol use: Yes    Comment: 1 pint of liquor daily  . Drug use: No     Allergies   Patient has no known allergies.   Review of Systems Review of Systems  Gastrointestinal: Positive for abdominal pain. Negative for constipation, diarrhea, nausea and vomiting.  Genitourinary: Positive for dysuria, pelvic pain, vaginal bleeding and vaginal discharge.  All other systems reviewed and are negative.    Physical Exam Updated Vital Signs BP (!) 143/86 (BP Location: Right Arm)   Pulse 80   Temp 98.6 F (37 C) (Oral)   Resp 14   Wt 99.8 kg (220 lb)   SpO2 100%   BMI 35.51 kg/m   Physical Exam  Constitutional: She is oriented to person, place, and time. She appears well-developed and well-nourished. No distress.  HENT:  Head: Normocephalic and atraumatic.  Cardiovascular: Normal rate, regular rhythm and normal heart sounds.  No murmur heard. Pulmonary/Chest: Effort normal and breath sounds normal. No respiratory distress.  Abdominal: Soft. Bowel sounds are normal. She exhibits no distension.  No abdominal or CVA tenderness.  Genitourinary:  Genitourinary Comments: Chaperone present for exam. Menstrual bleeding. No discharge appreciated, but limited exam due to bleeding. No CMT. + left adnexal tenderness.  Neurological: She is alert and oriented to person, place, and time.  Skin: Skin is warm and dry.  Nursing note and vitals reviewed.    ED Treatments / Results  Labs (all labs ordered are listed, but only abnormal results are displayed) Labs Reviewed  WET PREP, GENITAL - Abnormal;  Notable for the following components:      Result Value   WBC, Wet Prep HPF POC MODERATE (*)    All other components within normal limits  URINALYSIS, ROUTINE W REFLEX MICROSCOPIC - Abnormal; Notable for the following components:   Color, Urine RED (*)    APPearance CLOUDY (*)    Hgb urine dipstick LARGE (*)    Protein, ur 100 (*)    Leukocytes, UA TRACE (*)    Bacteria, UA RARE (*)    All other components within normal limits  POC URINE PREG, ED  GC/CHLAMYDIA PROBE AMP ([place]) NOT AT [facility] [address]    EKG  EKG Interpretation None       Radiology US Transvaginal Non-ob  Result Date: 02/05/2018 CLINICAL DATA:  27 year old female with pelvic pain and heavy periods. EXAM: TRANSABDOMINAL AND  TRANSVAGINAL ULTRASOUND OF PELVIS DOPPLER ULTRASOUND OF OVARIES TECHNIQUE: Both transabdominal and transvaginal ultrasound examinations of the pelvis were performed. Transabdominal technique was performed for global imaging of the pelvis including uterus, ovaries, adnexal regions, and pelvic cul-de-sac. It was necessary to proceed with endovaginal exam following the transabdominal exam to visualize the knee trauma and ovaries. Color and duplex Doppler ultrasound was utilized to evaluate blood flow to the ovaries. COMPARISON:  None. FINDINGS: Uterus Measurements: 10.3 x 6.7 x 6.3 cm. There is a 1.7 x 1.5 x 1.7 cm anterior body intramural/subserosal fibroid. There is a 2.4 x 2.2 x 1.4 cm posterior body intramural fibroid. Endometrium Thickness: 10 mm.  No focal abnormality visualized. Right ovary Measurements: 3.4 x 2.1 x 2.9 cm. Normal appearance/no adnexal mass. Left ovary Measurements: 3.5 x 2.7 x 3.0 cm. Normal appearance/no adnexal mass. Pulsed Doppler evaluation of both ovaries demonstrates normal low-resistance arterial and venous waveforms. Other findings No abnormal free fluid. IMPRESSION: 1. Two small uterine fibroids. 2. Unremarkable endometrium and the ovaries. 3. Doppler detected flow to both  ovaries. Electronically Signed   By: Anner Crete M.D.   On: 02/05/2018 00:24   US Pelvis Complete  Result Date: 02/05/2018 CLINICAL DATA:  27 year old female with pelvic pain and heavy periods. EXAM: TRANSABDOMINAL AND TRANSVAGINAL ULTRASOUND OF PELVIS DOPPLER ULTRASOUND OF OVARIES TECHNIQUE: Both transabdominal and transvaginal ultrasound examinations of the pelvis were performed. Transabdominal technique was performed for global imaging of the pelvis including uterus, ovaries, adnexal regions, and pelvic cul-de-sac. It was necessary to proceed with endovaginal exam following the transabdominal exam to visualize the knee trauma and ovaries. Color and duplex Doppler ultrasound was utilized to evaluate blood flow to the ovaries. COMPARISON:  None. FINDINGS: Uterus Measurements: 10.3 x 6.7 x 6.3 cm. There is a 1.7 x 1.5 x 1.7 cm anterior body intramural/subserosal fibroid. There is a 2.4 x 2.2 x 1.4 cm posterior body intramural fibroid. Endometrium Thickness: 10 mm.  No focal abnormality visualized. Right ovary Measurements: 3.4 x 2.1 x 2.9 cm. Normal appearance/no adnexal mass. Left ovary Measurements: 3.5 x 2.7 x 3.0 cm. Normal appearance/no adnexal mass. Pulsed Doppler evaluation of both ovaries demonstrates normal low-resistance arterial and venous waveforms. Other findings No abnormal free fluid. IMPRESSION: 1. Two small uterine fibroids. 2. Unremarkable endometrium and the ovaries. 3. Doppler detected flow to both ovaries. Electronically Signed   By: Anner Crete M.D.   On: 02/05/2018 00:24   [place] Art/ven Flow Abd Pelv Doppler  Result Date: 02/05/2018 CLINICAL DATA:  27 year old female with pelvic pain and heavy periods. EXAM: TRANSABDOMINAL AND TRANSVAGINAL ULTRASOUND OF PELVIS DOPPLER ULTRASOUND OF OVARIES TECHNIQUE: Both transabdominal and transvaginal ultrasound examinations of the pelvis were performed. Transabdominal technique was performed for global imaging of the pelvis including uterus,  ovaries, adnexal regions, and pelvic cul-de-sac. It was necessary to proceed with endovaginal exam following the transabdominal exam to visualize the knee trauma and ovaries. Color and duplex Doppler ultrasound was utilized to evaluate blood flow to the ovaries. COMPARISON:  None. FINDINGS: Uterus Measurements: 10.3 x 6.7 x 6.3 cm. There is a 1.7 x 1.5 x 1.7 cm anterior body intramural/subserosal fibroid. There is a 2.4 x 2.2 x 1.4 cm posterior body intramural fibroid. Endometrium Thickness: 10 mm.  No focal abnormality visualized. Right ovary Measurements: 3.4 x 2.1 x 2.9 cm. Normal appearance/no adnexal mass. Left ovary Measurements: 3.5 x 2.7 x 3.0 cm. Normal appearance/no adnexal mass. Pulsed Doppler evaluation of both ovaries demonstrates normal low-resistance arterial and venous waveforms.  Other findings No abnormal free fluid. IMPRESSION: 1. Two small uterine fibroids. 2. Unremarkable endometrium and the ovaries. 3. Doppler detected flow to both ovaries. Electronically Signed   By: Anner Crete M.D.   On: 02/05/2018 00:24    Procedures Procedures (including critical care time)  Medications Ordered in ED Medications  oxyCODONE-acetaminophen (PERCOCET/ROXICET) 5-325 MG per tablet 1 tablet (1 tablet Oral Given 02/04/18 2216)  sterile water (preservative free) injection (not administered)  cefTRIAXone (ROCEPHIN) injection 250 mg (250 mg Intramuscular Given 02/05/18 0045)  azithromycin (ZITHROMAX) tablet 1,000 mg (1,000 mg Oral Given 02/05/18 0045)     Initial Impression / Assessment and Plan / ED Course  I have reviewed the triage vital signs and the nursing notes.  Pertinent labs & imaging results that were available during my care of the patient were reviewed by me and considered in my medical decision making (see chart for details).    Elizabeth David is a 27 y.o. female who presents to ED for pelvic pain, vaginal itching and burning.  On exam, patient is afebrile, hemodynamically stable  with no abdominal or CVA tenderness.  She is on her menstrual cycle, therefore limiting exam, but no discharge appreciated.  She does have left adnexal tenderness.  Ultrasound was obtained showing uterine fibroids, but otherwise unremarkable.  UA does not appear infectious.  Wet prep with moderate WBCs, but otherwise unremarkable.  Patient does have history of genital herpes.  Vaginal burning/itching could be outbreak prodrome.  She has antivirals at home and was encouraged to start taking this medication.  We will also prophylactically treat for G and C.  OB/GYN follow-up encouraged.  Reasons to return to ER discussed and all questions answered.   Final Clinical Impressions(s) / ED Diagnoses   Final diagnoses:  Pelvic pain  Uterine leiomyoma, unspecified location    ED Discharge Orders    None       Ward, Ozella Almond, PA-C 02/05/18 0053    Orpah Greek, MD 02/05/18 [phone]

## 2018-02-04 NOTE — ED Notes (Signed)
Patient complaining of vaginal itching/burning along with excessive bleeding during period where she has to change pad approximately every hour.  Family history of fibroids.  Also noticing discharge/blood after intercourse.

## 2018-02-05 LAB — GC/CHLAMYDIA PROBE AMP (~~LOC~~) NOT AT ARMC
CHLAMYDIA, DNA PROBE: NEGATIVE
Neisseria Gonorrhea: NEGATIVE

## 2018-02-05 MED ORDER — CEFTRIAXONE SODIUM 250 MG IJ SOLR
250.0000 mg | Freq: Once | INTRAMUSCULAR | Status: AC
  Administered 2018-02-05: 250 mg via INTRAMUSCULAR
  Filled 2018-02-05: qty 250

## 2018-02-05 MED ORDER — AZITHROMYCIN 250 MG PO TABS
1000.0000 mg | ORAL_TABLET | Freq: Once | ORAL | Status: AC
  Administered 2018-02-05: 1000 mg via ORAL
  Filled 2018-02-05: qty 4

## 2018-02-05 MED ORDER — STERILE WATER FOR INJECTION IJ SOLN
INTRAMUSCULAR | Status: AC
  Filled 2018-02-05: qty 10

## 2018-02-05 NOTE — Discharge Instructions (Signed)
°  Use a condom with every sexual encounter Follow up with your OBGYN in regards to today's visit.  If you do not have one, please call the clinic listed to arrange for an appointment.  Please return to the ER for fevers, vomiting, new or worsening symptoms, any additional concerns.   You have been tested for chlamydia and gonorrhea. These results will be available in approximately 3 days. You will be notified if they are positive.

## 2018-02-05 NOTE — ED Notes (Signed)
Pt discharged from ED; instructions provided; Pt encouraged to return to ED if symptoms worsen and to f/u with PCP; Pt verbalized understanding of all instructions 

## 2018-02-19 ENCOUNTER — Emergency Department (HOSPITAL_COMMUNITY)
Admission: EM | Admit: 2018-02-19 | Discharge: 2018-02-19 | Disposition: A | Discharge status: Home / Self Care | Payer: No Typology Code available for payment source | Attending: Emergency Medicine | Admitting: Emergency Medicine

## 2018-02-19 ENCOUNTER — Encounter (HOSPITAL_COMMUNITY): Payer: Self-pay | Admitting: Emergency Medicine

## 2018-02-19 DIAGNOSIS — R630 Anorexia: Secondary | ICD-10-CM | POA: Insufficient documentation

## 2018-02-19 DIAGNOSIS — Z87891 Personal history of nicotine dependence: Secondary | ICD-10-CM | POA: Insufficient documentation

## 2018-02-19 DIAGNOSIS — R110 Nausea: Secondary | ICD-10-CM

## 2018-02-19 DIAGNOSIS — R10816 Epigastric abdominal tenderness: Secondary | ICD-10-CM | POA: Insufficient documentation

## 2018-02-19 DIAGNOSIS — J45909 Unspecified asthma, uncomplicated: Secondary | ICD-10-CM | POA: Insufficient documentation

## 2018-02-19 DIAGNOSIS — R197 Diarrhea, unspecified: Secondary | ICD-10-CM

## 2018-02-19 DIAGNOSIS — R109 Unspecified abdominal pain: Secondary | ICD-10-CM

## 2018-02-19 DIAGNOSIS — Z79899 Other long term (current) drug therapy: Secondary | ICD-10-CM | POA: Insufficient documentation

## 2018-02-19 LAB — URINALYSIS, ROUTINE W REFLEX MICROSCOPIC
BILIRUBIN URINE: NEGATIVE
GLUCOSE, UA: NEGATIVE mg/dL
KETONES UR: NEGATIVE mg/dL
NITRITE: NEGATIVE
PH: 6 (ref 5.0–8.0)
PROTEIN: NEGATIVE mg/dL
Specific Gravity, Urine: 1.024 (ref 1.005–1.030)

## 2018-02-19 LAB — COMPREHENSIVE METABOLIC PANEL
ALK PHOS: 57 U/L (ref 38–126)
ALT: 14 U/L (ref 14–54)
AST: 17 U/L (ref 15–41)
Albumin: 3.7 g/dL (ref 3.5–5.0)
Anion gap: 8 (ref 5–15)
BUN: 16 mg/dL (ref 6–20)
CALCIUM: 8.7 mg/dL — AB (ref 8.9–10.3)
CO2: 26 mmol/L (ref 22–32)
CREATININE: 0.76 mg/dL (ref 0.44–1.00)
Chloride: 103 mmol/L (ref 101–111)
Glucose, Bld: 98 mg/dL (ref 65–99)
Potassium: 3.8 mmol/L (ref 3.5–5.1)
SODIUM: 137 mmol/L (ref 135–145)
Total Bilirubin: 0.6 mg/dL (ref 0.3–1.2)
Total Protein: 7.2 g/dL (ref 6.5–8.1)

## 2018-02-19 LAB — CBC
HCT: 38.3 % (ref 36.0–46.0)
Hemoglobin: 12.5 g/dL (ref 12.0–15.0)
MCH: 27.8 pg (ref 26.0–34.0)
MCHC: 32.6 g/dL (ref 30.0–36.0)
MCV: 85.1 fL (ref 78.0–100.0)
PLATELETS: 317 10*3/uL (ref 150–400)
RBC: 4.5 MIL/uL (ref 3.87–5.11)
RDW: 15.8 % — ABNORMAL HIGH (ref 11.5–15.5)
WBC: 7.2 10*3/uL (ref 4.0–10.5)

## 2018-02-19 LAB — PREGNANCY, URINE: Preg Test, Ur: NEGATIVE

## 2018-02-19 LAB — LIPASE, BLOOD: Lipase: 26 U/L (ref 11–51)

## 2018-02-19 MED ORDER — ONDANSETRON 8 MG PO TBDP
8.0000 mg | ORAL_TABLET | Freq: Once | ORAL | Status: AC
  Administered 2018-02-19: 8 mg via ORAL
  Filled 2018-02-19: qty 1

## 2018-02-19 MED ORDER — DICYCLOMINE HCL 20 MG PO TABS: 20.0000 mg | ORAL_TABLET | Freq: Two times a day (BID) | ORAL | 0 refills | Status: DC

## 2018-02-19 MED ORDER — DICYCLOMINE HCL 10 MG PO CAPS
10.0000 mg | ORAL_CAPSULE | Freq: Once | ORAL | Status: DC
  Filled 2018-02-19: qty 1

## 2018-02-19 MED ORDER — DICYCLOMINE HCL 10 MG PO CAPS
10.0000 mg | ORAL_CAPSULE | Freq: Once | ORAL | Status: AC
  Administered 2018-02-19: 10 mg via ORAL

## 2018-02-19 NOTE — ED Provider Notes (Signed)
[facility] DEPT Provider Note   CSN: [id number] Arrival date & time: 02/19/18  1035     History   Chief Complaint Chief Complaint  Patient presents with  . Abdominal Pain    HPI Elizabeth David is a 27 y.o. female who presents with abdominal cramping.  Past medical history significant for genital herpes, obesity.  She states that last night she developed abdominal cramping of the upper abdomen.  She had difficulty sleeping due to the pain.  The cramping is intermittent and associated with nausea.  She states she has 2 young children at home and was at work when the pain began to bother her a lot more so she came to the ED.  Since she has been here she has had multiple episodes of non-bloody, watery diarrhea.  She denies known sick contacts or recent travel.  She has had recent antibiotics because she was treated prophylactically for STDs in late February.  She has been able to keep small amounts of liquids down today but reports a decreased appetite.  She denies fever, chest pain, shortness of breath, cough, vomiting, urinary symptoms, vaginal discharge.  She denies prior abdominal surgeries  HPI  Past Medical History:  Diagnosis Date  . Active labor 12/04/2014  . Alcohol abuse   . Anemia   . Asthma    as child  . Genital herpes   . SVD (spontaneous vaginal delivery) 12/04/2014    Patient Active Problem List   Diagnosis Date Noted  . Substance induced mood disorder ([place]) 10/24/2017  . Active labor 12/04/2014  . SVD (spontaneous vaginal delivery) 12/04/2014  . VAGINAL DISCHARGE 06/03/2010  . FREQUENCY, URINARY 06/03/2010  . BACK STRAIN, LUMBAR 06/03/2010  . MORBID OBESITY 11/03/2009  . VITAMIN B12 DEFICIENCY 05/14/2009  . FATIGUE 05/06/2009  . DYSURIA 05/06/2009  . PAIN IN JOINT, ANKLE AND FOOT 08/13/2008    Past Surgical History:  Procedure Laterality Date  . TONSILLECTOMY     at 28 years of age    47 History    47 Para Term  Preterm AB Living   3 2 1 1 1 2    SAB TAB Ectopic Multiple Live Births   1   0 0 2       Home Medications    Prior to Admission medications   Medication Sig Start Date End Date Taking? Authorizing Provider  acamprosate (CAMPRAL) 333 MG tablet Take 2 tablets (666 mg total) 3 (three) times daily with meals by mouth. For alcoholism Patient not taking: Reported on 02/04/2018 10/26/17   Lindell Spar I, NP  albuterol (PROVENTIL HFA;VENTOLIN HFA) 108 (90 Base) MCG/ACT inhaler Inhale 2 puffs every 6 (six) hours as needed into the lungs for wheezing or shortness of breath. Patient not taking: Reported on 02/04/2018 10/26/17   Lindell Spar I, NP  escitalopram (LEXAPRO) 10 MG tablet Take 1 tablet (10 mg total) daily by mouth. For depression Patient not taking: Reported on 02/04/2018 10/27/17   Lindell Spar I, NP  fluticasone (FLONASE) 50 MCG/ACT nasal spray Place 2 sprays daily as needed into both nostrils for allergies. Patient not taking: Reported on 02/04/2018 10/26/17   Lindell Spar I, NP  hydrOXYzine (ATARAX/VISTARIL) 25 MG tablet Take 1 tablet (25 mg) three times daily as needed: For anxiety Patient not taking: Reported on 02/04/2018 10/26/17   Lindell Spar I, NP  oseltamivir (TAMIFLU) 75 MG capsule Take 1 capsule (75 mg total) by mouth every 12 (twelve) hours. Patient not taking: Reported  on 02/04/2018 12/24/17   Blanchie Dessert, MD  PRESCRIPTION MEDICATION Take 1 tablet by mouth daily. For herpes    [provider]  traZODone (DESYREL) 100 MG tablet Take 1 tablet (100 mg total) at bedtime by mouth. For sleep Patient not taking: Reported on 02/04/2018 10/26/17   Lindell Spar I, NP    Family History Family History  Problem Relation Age of Onset  . Hypertension Mother   . Diabetes Mother   . Heart failure Other     Social History Social History   Tobacco Use  . Smoking status: Former Smoker    Packs/day: 0.00    Years: 3.00    Pack years: 0.00    Types: Cigarettes    Last  attempt to quit: 06/16/2012    Years since quitting: 5.6  . Smokeless tobacco: Never Used  Substance Use Topics  . Alcohol use: Yes    Comment: 1 pint of liquor daily  . Drug use: No     Allergies   Patient has no known allergies.   Review of Systems Review of Systems  Constitutional: Negative for fever.  Respiratory: Negative for shortness of breath.   Cardiovascular: Negative for chest pain.  Gastrointestinal: Positive for abdominal pain, diarrhea and nausea. Negative for vomiting.  Genitourinary: Negative for dysuria and frequency.  All other systems reviewed and are negative.    Physical Exam Updated Vital Signs BP (!) 150/90 (BP Location: Right Arm)   Pulse 87   Temp 98.2 F (36.8 C) (Oral)   Resp 16   LMP 02/12/2018   SpO2 100%   Physical Exam  Constitutional: She is oriented to person, place, and time. She appears well-developed and well-nourished. No distress.  Obese, calm, cooperative. Well appearing, talking on cell phone  HENT:  Head: Normocephalic and atraumatic.  Eyes: Conjunctivae are normal. Pupils are equal, round, and reactive to light. Right eye exhibits no discharge. Left eye exhibits no discharge. No scleral icterus.  Neck: Normal range of motion.  Cardiovascular: Normal rate and regular rhythm.  Pulmonary/Chest: Effort normal and breath sounds normal. No respiratory distress.  Abdominal: Soft. Bowel sounds are normal. She exhibits no distension and no mass. There is tenderness (periumbilical and epigastric tenderness). There is no rebound and no guarding. No hernia.  Neurological: She is alert and oriented to person, place, and time.  Skin: Skin is warm and dry.  Psychiatric: She has a normal mood and affect. Her behavior is normal.  Nursing note and vitals reviewed.    ED Treatments / Results  Labs (all labs ordered are listed, but only abnormal results are displayed) Labs Reviewed  COMPREHENSIVE METABOLIC PANEL - Abnormal; Notable for the  following components:      Result Value   Calcium 8.7 (*)    All other components within normal limits  CBC - Abnormal; Notable for the following components:   RDW 15.8 (*)    All other components within normal limits  URINALYSIS, ROUTINE W REFLEX MICROSCOPIC - Abnormal; Notable for the following components:   Hgb urine dipstick SMALL (*)    Leukocytes, UA TRACE (*)    Bacteria, UA RARE (*)    Squamous Epithelial / LPF 6-30 (*)    All other components within normal limits  LIPASE, BLOOD  PREGNANCY, URINE    EKG  EKG Interpretation None       Radiology No results found.  Procedures Procedures (including critical care time)  Medications Ordered in ED Medications  ondansetron (ZOFRAN-ODT) disintegrating  tablet 8 mg (8 mg Oral Given 02/19/18 1634)  dicyclomine (BENTYL) capsule 10 mg (10 mg Oral Given 02/19/18 1714)     Initial Impression / Assessment and Plan / ED Course  I have reviewed the triage vital signs and the nursing notes.  Pertinent labs & imaging results that were available during my care of the patient were reviewed by me and considered in my medical decision making (see chart for details).  27 year old female presents with abdominal cramping and non-bloody diarrhea for less than 24 hours.  She is mildly hypertensive otherwise vital signs are normal.  She is well-appearing and has mild periumbilical abdominal tenderness on exam.  She has had 4 ED visits in the past 6 months and recent antibiotics however doubt C. difficile colitis at this time.  Her CBC is normal.  Her CMP and lipase are normal.  Her urine does not show obvious signs of infection.  Her main complaint is cramping.  She will give given a prescription for Bentyl.  She declined a prescription for antiemetics.  She was advised to return for persistent or worsening symptoms.  Final Clinical Impressions(s) / ED Diagnoses   Final diagnoses:  Diarrhea of presumed infectious origin  Abdominal cramping    Nausea    ED Discharge Orders    None       Recardo Evangelist, PA-C 02/19/18 1753    Dorie Rank, MD 02/19/18 2256

## 2018-02-19 NOTE — ED Notes (Signed)
Pt sts she is also having episodes of diarrhea since she has been in the lobby.

## 2018-02-19 NOTE — ED Triage Notes (Signed)
Patient c/o abdominal cramping with nausea since last night. Denies V/D.

## 2018-02-19 NOTE — Discharge Instructions (Addendum)
Please rest and drink plenty of fluids Wash your hands every time you use the bathroom Take Bentyl for cramping  Return if worsening

## 2018-04-15 ENCOUNTER — Encounter (HOSPITAL_COMMUNITY): Payer: Self-pay | Admitting: Emergency Medicine

## 2018-04-15 ENCOUNTER — Emergency Department (HOSPITAL_COMMUNITY)
Admission: EM | Admit: 2018-04-15 | Discharge: 2018-04-15 | Disposition: A | Discharge status: Home / Self Care | Payer: No Typology Code available for payment source | Attending: Emergency Medicine | Admitting: Emergency Medicine

## 2018-04-15 DIAGNOSIS — J069 Acute upper respiratory infection, unspecified: Secondary | ICD-10-CM | POA: Insufficient documentation

## 2018-04-15 DIAGNOSIS — J45909 Unspecified asthma, uncomplicated: Secondary | ICD-10-CM | POA: Insufficient documentation

## 2018-04-15 DIAGNOSIS — Z87891 Personal history of nicotine dependence: Secondary | ICD-10-CM | POA: Insufficient documentation

## 2018-04-15 DIAGNOSIS — Z79899 Other long term (current) drug therapy: Secondary | ICD-10-CM | POA: Insufficient documentation

## 2018-04-15 LAB — URINALYSIS, ROUTINE W REFLEX MICROSCOPIC
Bilirubin Urine: NEGATIVE
GLUCOSE, UA: NEGATIVE mg/dL
Ketones, ur: NEGATIVE mg/dL
Leukocytes, UA: NEGATIVE
Nitrite: NEGATIVE
PROTEIN: NEGATIVE mg/dL
Specific Gravity, Urine: 1.019 (ref 1.005–1.030)
pH: 5 (ref 5.0–8.0)

## 2018-04-15 LAB — GROUP A STREP BY PCR: GROUP A STREP BY PCR: NOT DETECTED

## 2018-04-15 LAB — POC URINE PREG, ED: Preg Test, Ur: NEGATIVE

## 2018-04-15 MED ORDER — PSEUDOEPHEDRINE HCL 30 MG PO TABS: 30.0000 mg | ORAL_TABLET | Freq: Four times a day (QID) | ORAL | 0 refills | Status: DC | PRN

## 2018-04-15 NOTE — ED Notes (Signed)
Bed: WTR9 Expected date:  Expected time:  Means of arrival:  Comments: 

## 2018-04-15 NOTE — ED Provider Notes (Signed)
[facility] DEPT Provider Note   CSN: [id number] Arrival date & time: 04/15/18  0909     History   Chief Complaint Chief Complaint  Patient presents with  . Cough  . Sore Throat    HPI Elizabeth David is a 27 y.o. female who presents to the ED with c/o cough and sore throat for the past 4 to 5 days. Patient also c/o head congestion and ears feeling clogged up.   The history is provided by the patient. No language interpreter was used.  Sore Throat  Associated symptoms include headaches. Pertinent negatives include no chest pain and no abdominal pain.  URI   This is a new problem. The current episode started more than 2 days ago. The problem has been gradually worsening. The maximum temperature recorded prior to her arrival was 100 to 100.9 F. Associated symptoms include congestion, ear pain, headaches, plugged ear sensation, sinus pain and sore throat. Pertinent negatives include no chest pain, no abdominal pain, no nausea, no vomiting, no cough, no rash and no wheezing. She has tried other medications for the symptoms.    Past Medical History:  Diagnosis Date  . Active labor 12/04/2014  . Alcohol abuse   . Anemia   . Asthma    as child  . Genital herpes   . SVD (spontaneous vaginal delivery) 12/04/2014    Patient Active Problem List   Diagnosis Date Noted  . Substance induced mood disorder ([place]) 10/24/2017  . Active labor 12/04/2014  . SVD (spontaneous vaginal delivery) 12/04/2014  . VAGINAL DISCHARGE 06/03/2010  . FREQUENCY, URINARY 06/03/2010  . BACK STRAIN, LUMBAR 06/03/2010  . MORBID OBESITY 11/03/2009  . VITAMIN B12 DEFICIENCY 05/14/2009  . FATIGUE 05/06/2009  . DYSURIA 05/06/2009  . PAIN IN JOINT, ANKLE AND FOOT 08/13/2008    Past Surgical History:  Procedure Laterality Date  . TONSILLECTOMY     at 27 years of age     13 History    Gravida  80   Para  2   Term  1   Preterm  1   AB  1   Living  2     SAB  1   TAB      Ectopic  0   Multiple  0   Live Births  2            Home Medications    Prior to Admission medications   Medication Sig Start Date End Date Taking? Authorizing Provider  dicyclomine (BENTYL) 20 MG tablet Take 1 tablet (20 mg total) by mouth 2 (two) times daily. 02/19/18   Recardo Evangelist, PA-C  PRESCRIPTION MEDICATION Take 1 tablet by mouth daily. For herpes    [provider]  pseudoephedrine (SUDAFED) 30 MG tablet Take 1 tablet (30 mg total) by mouth every 6 (six) hours as needed for congestion. 04/15/18   Ashley Murrain, NP    Family History Family History  Problem Relation Age of Onset  . Hypertension Mother   . Diabetes Mother   . Heart failure Other     Social History Social History   Tobacco Use  . Smoking status: Former Smoker    Packs/day: 0.00    Years: 3.00    Pack years: 0.00    Types: Cigarettes    Last attempt to quit: 06/16/2012    Years since quitting: 5.8  . Smokeless tobacco: Never Used  Substance Use Topics  . Alcohol use: Yes  Comment: 1 pint of liquor daily  . Drug use: No     Allergies   Patient has no known allergies.   Review of Systems Review of Systems  Constitutional: Negative for chills. Fever: ?  HENT: Positive for congestion, ear pain, sinus pain and sore throat.   Eyes: Positive for redness. Negative for pain and discharge.       Irritation  Respiratory: Negative for cough and wheezing.   Cardiovascular: Negative for chest pain.  Gastrointestinal: Negative for abdominal pain, nausea and vomiting.  Genitourinary: Positive for frequency. Negative for pelvic pain, vaginal bleeding and vaginal discharge.  Skin: Negative for rash.  Neurological: Positive for headaches. Negative for syncope.  Psychiatric/Behavioral: Negative for confusion.     Physical Exam Updated Vital Signs BP 129/88   Pulse 88   Temp 99.7 F (37.6 C) (Oral)   Resp 17   Ht 5\' 6"  (1.676 m)   Wt 104.3 kg (230 lb)   LMP  03/28/2018   SpO2 100%   BMI 37.12 kg/m   Physical Exam  Constitutional: She appears well-developed and well-nourished. No distress.  HENT:  Head: Normocephalic.  Right Ear: Tympanic membrane normal.  Left Ear: Tympanic membrane normal.  Nose: Mucosal edema and rhinorrhea present.  Mouth/Throat: Uvula is midline and oropharynx is clear and moist.  Eyes: Pupils are equal, round, and reactive to light. Conjunctivae and EOM are normal.  Neck: Normal range of motion. Neck supple.  Cardiovascular: Normal rate and regular rhythm.  Pulmonary/Chest: Effort normal and breath sounds normal. She has no wheezes. She has no rales.  Musculoskeletal: Normal range of motion.  Lymphadenopathy:    She has no cervical adenopathy.  Neurological: She is alert.  Skin: Skin is warm and dry.  Psychiatric: She has a normal mood and affect. Her behavior is normal.  Nursing note and vitals reviewed.    ED Treatments / Results  Labs (all labs ordered are listed, but only abnormal results are displayed) Labs Reviewed  URINALYSIS, ROUTINE W REFLEX MICROSCOPIC - Abnormal; Notable for the following components:      Result Value   Hgb urine dipstick SMALL (*)    Bacteria, UA RARE (*)    All other components within normal limits  GROUP A STREP BY PCR  POC URINE PREG, ED   Radiology No results found.  Procedures Procedures (including critical care time)  Medications Ordered in ED Medications - No data to display   Initial Impression / Assessment and Plan / ED Course  I have reviewed the triage vital signs and the nursing notes. Patients symptoms are consistent with URI, likely viral etiology. Discussed that antibiotics are not indicated for viral infections. Pt will be discharged with symptomatic treatment.  Verbalizes understanding and is agreeable with plan. Pt is hemodynamically stable & in NAD prior to dc.  Final Clinical Impressions(s) / ED Diagnoses   Final diagnoses:  Upper respiratory  tract infection, unspecified type    ED Discharge Orders        Ordered    pseudoephedrine (SUDAFED) 30 MG tablet  Every 6 hours PRN     04/15/18 1345       Janit Bern Carlos, NP 04/15/18 1607    Davonna Belling, MD 04/15/18 [phone]

## 2018-04-15 NOTE — ED Triage Notes (Addendum)
Per pt, states she has had a sore throat for 4-5 days-states head congestion, and her ears feel clogged

## 2018-04-15 NOTE — Discharge Instructions (Addendum)
Your strep screen is negative. Use chloraseptic spray and salt water gargles. Take tylenol and ibuprofen as needed for fever or pain. Follow up with [facility], return here as needed.

## 2018-04-15 NOTE — ED Notes (Signed)
Pt was rushing to leave so she could go get her son. D/c instructions reviewed with the patient

## 2018-04-23 ENCOUNTER — Encounter (HOSPITAL_COMMUNITY): Payer: Self-pay | Admitting: Emergency Medicine

## 2018-04-23 ENCOUNTER — Emergency Department (HOSPITAL_COMMUNITY)
Admission: EM | Admit: 2018-04-23 | Discharge: 2018-04-23 | Disposition: A | Discharge status: Home / Self Care | Payer: No Typology Code available for payment source | Attending: Emergency Medicine | Admitting: Emergency Medicine

## 2018-04-23 ENCOUNTER — Emergency Department (HOSPITAL_COMMUNITY): Payer: No Typology Code available for payment source

## 2018-04-23 DIAGNOSIS — J0100 Acute maxillary sinusitis, unspecified: Secondary | ICD-10-CM | POA: Insufficient documentation

## 2018-04-23 DIAGNOSIS — Z87891 Personal history of nicotine dependence: Secondary | ICD-10-CM | POA: Insufficient documentation

## 2018-04-23 DIAGNOSIS — J45909 Unspecified asthma, uncomplicated: Secondary | ICD-10-CM | POA: Insufficient documentation

## 2018-04-23 MED ORDER — AMOXICILLIN-POT CLAVULANATE 875-125 MG PO TABS: 1.0000 | ORAL_TABLET | Freq: Two times a day (BID) | ORAL | 0 refills | Status: DC

## 2018-04-23 MED ORDER — AMOXICILLIN-POT CLAVULANATE 875-125 MG PO TABS
1.0000 | ORAL_TABLET | Freq: Once | ORAL | Status: AC
  Administered 2018-04-23: 1 via ORAL
  Filled 2018-04-23: qty 1

## 2018-04-23 MED ORDER — IBUPROFEN 200 MG PO TABS
600.0000 mg | ORAL_TABLET | Freq: Once | ORAL | Status: AC
  Administered 2018-04-23: 600 mg via ORAL
  Filled 2018-04-23: qty 3

## 2018-04-23 MED ORDER — LIDOCAINE VISCOUS HCL 2 % MT SOLN: 10.0000 mL | OROMUCOSAL | 0 refills | Status: DC | PRN

## 2018-04-23 NOTE — Discharge Instructions (Addendum)
Take tylenol as needed for pain. Follow up with [facility] and [facility]. Return here as needed.

## 2018-04-23 NOTE — ED Provider Notes (Signed)
[facility] DEPT Provider Note   CSN: [id number] Arrival date & time: 04/23/18  1006     History   Chief Complaint Chief Complaint  Patient presents with  . Cough  . Sore Throat    HPI Elizabeth David is a 27 y.o. female who presents to the ED with sore throat and cough. Patient reports being here a week ago and had negative strep screen and was treated for URI. Patient reports symptoms have not improved. Patient reports now she is having pain around the right eye and it fells like her teeth hurt but she has no dental problems. Patient has been taking sudafed, ibuprofen and tylenol.   HPI  Past Medical History:  Diagnosis Date  . Active labor 12/04/2014  . Alcohol abuse   . Anemia   . Asthma    as child  . Genital herpes   . SVD (spontaneous vaginal delivery) 12/04/2014    Patient Active Problem List   Diagnosis Date Noted  . Substance induced mood disorder ([place]) 10/24/2017  . Active labor 12/04/2014  . SVD (spontaneous vaginal delivery) 12/04/2014  . VAGINAL DISCHARGE 06/03/2010  . FREQUENCY, URINARY 06/03/2010  . BACK STRAIN, LUMBAR 06/03/2010  . MORBID OBESITY 11/03/2009  . VITAMIN B12 DEFICIENCY 05/14/2009  . FATIGUE 05/06/2009  . DYSURIA 05/06/2009  . PAIN IN JOINT, ANKLE AND FOOT 08/13/2008    Past Surgical History:  Procedure Laterality Date  . TONSILLECTOMY     at 27 years of age     68 History    Gravida  66   Para  2   Term  1   Preterm  1   AB  1   Living  2     SAB  1   TAB      Ectopic  0   Multiple  0   Live Births  2            Home Medications    Prior to Admission medications   Medication Sig Start Date End Date Taking? Authorizing Provider  ibuprofen (ADVIL,MOTRIN) 200 MG tablet Take 800 mg by mouth every 6 (six) hours as needed (For headache or pain.).   Yes [provider]  Loratadine-Pseudoephedrine (ALLERGY/CONGESTION RELIEF PO) Take 1 tablet by mouth every 6 (six) hours  as needed (For congestion.).   Yes [provider]  amoxicillin-clavulanate (AUGMENTIN) 875-125 MG tablet Take 1 tablet by mouth every 12 (twelve) hours. 04/23/18   Ashley Murrain, NP  lidocaine (XYLOCAINE) 2 % solution Use as directed 10 mLs in the mouth or throat every 3 (three) hours as needed for mouth pain. 04/23/18   Ashley Murrain, NP    Family History Family History  Problem Relation Age of Onset  . Hypertension Mother   . Diabetes Mother   . Heart failure Other     Social History Social History   Tobacco Use  . Smoking status: Former Smoker    Packs/day: 0.00    Years: 3.00    Pack years: 0.00    Types: Cigarettes    Last attempt to quit: 06/16/2012    Years since quitting: 5.8  . Smokeless tobacco: Never Used  Substance Use Topics  . Alcohol use: Yes    Comment: 1 pint of liquor daily  . Drug use: No     Allergies   Patient has no known allergies.   Review of Systems Review of Systems  Constitutional: Negative for chills and fever.  HENT: Positive for congestion, ear pain, sinus pressure, sinus pain and sore throat. Negative for trouble swallowing.   Eyes: Negative for visual disturbance.  Respiratory: Positive for cough. Negative for shortness of breath.   Cardiovascular: Negative for chest pain.  Gastrointestinal: Negative for abdominal pain, nausea and vomiting.  Musculoskeletal: Positive for myalgias. Negative for neck stiffness.  Skin: Negative for rash.  Neurological: Positive for headaches. Negative for syncope.  Psychiatric/Behavioral: Negative for confusion.     Physical Exam Updated Vital Signs BP (!) 150/91 (BP Location: Left Arm)   Pulse 80   Temp 98.6 F (37 C) (Oral)   Resp 18   LMP 03/28/2018   SpO2 100%   Physical Exam  Constitutional: She appears well-developed and well-nourished. No distress.  HENT:  Head: Normocephalic.  Right Ear: Tympanic membrane normal. No mastoid tenderness.  Left Ear: Tympanic membrane normal. No  mastoid tenderness.  Nose: Right sinus exhibits maxillary sinus tenderness.  Mouth/Throat: Uvula is midline, oropharynx is clear and moist and mucous membranes are normal.  Tonsils absent. Tender with palpation right maxillary sinus.   Eyes: Pupils are equal, round, and reactive to light. Conjunctivae and EOM are normal.  Neck: Normal range of motion. Neck supple.  Cardiovascular: Normal rate and regular rhythm.  Pulmonary/Chest: Effort normal. She has no wheezes. She has no rales.  Musculoskeletal: Normal range of motion.  Lymphadenopathy:    She has cervical adenopathy.  Neurological: She is alert.  Skin: Skin is warm and dry.  Psychiatric: She has a normal mood and affect. Her behavior is normal.  Nursing note and vitals reviewed.    ED Treatments / Results  Labs (all labs ordered are listed, but only abnormal results are displayed) Labs Reviewed - No data to display  Radiology Dg Chest 2 View  Result Date: 04/23/2018 CLINICAL DATA:  Cough and congestion with chest pain EXAM: CHEST - 2 VIEW COMPARISON:  December 24, 2017 FINDINGS: Lungs are clear. The heart size and pulmonary vascularity are normal. No adenopathy. No pneumothorax. No bone lesions. IMPRESSION: No edema or consolidation. Electronically Signed   By: Lowella Grip III M.D.   On: 04/23/2018 10:53    Procedures Procedures (including critical care time)  Medications Ordered in ED Medications  ibuprofen (ADVIL,MOTRIN) tablet 600 mg (600 mg Oral Given 04/23/18 1255)  amoxicillin-clavulanate (AUGMENTIN) 875-125 MG per tablet 1 tablet (1 tablet Oral Given 04/23/18 1255)     Initial Impression / Assessment and Plan / ED Course  I have reviewed the triage vital signs and the nursing notes. 27 y.o. female here with increased pain and sinus pressure since visit one week ago. Patient stable for d/c without fever, difficulty swallowing and does not appear toxic. Will treat for sinusitis and patient to f/u with [facility]  [facility]. She will return here as needed.   Final Clinical Impressions(s) / ED Diagnoses   Final diagnoses:  Acute maxillary sinusitis, recurrence not specified    ED Discharge Orders        Ordered    lidocaine (XYLOCAINE) 2 % solution  Every  3 hours PRN     04/23/18 1244    amoxicillin-clavulanate (AUGMENTIN) 875-125 MG tablet  Every 12 hours     04/23/18 1244       Janit Bern Clayton, NP 04/24/18 1344    Virgel Manifold, MD 04/27/18 0700

## 2018-04-23 NOTE — ED Notes (Signed)
Patient reports that throat swab was done last week.

## 2018-04-23 NOTE — ED Triage Notes (Signed)
Patient here from home with complaints of cough, sore throat, congestion since last week. Seen for same, no relief.

## 2018-06-10 ENCOUNTER — Encounter (HOSPITAL_COMMUNITY): Payer: Self-pay | Admitting: Obstetrics and Gynecology

## 2018-06-10 ENCOUNTER — Other Ambulatory Visit: Payer: Self-pay

## 2018-06-10 ENCOUNTER — Emergency Department (HOSPITAL_COMMUNITY)
Admission: EM | Admit: 2018-06-10 | Discharge: 2018-06-10 | Disposition: A | Discharge status: Home / Self Care | Payer: No Typology Code available for payment source | Attending: Emergency Medicine | Admitting: Emergency Medicine

## 2018-06-10 DIAGNOSIS — Y929 Unspecified place or not applicable: Secondary | ICD-10-CM | POA: Insufficient documentation

## 2018-06-10 DIAGNOSIS — Y93E8 Activity, other personal hygiene: Secondary | ICD-10-CM | POA: Insufficient documentation

## 2018-06-10 DIAGNOSIS — Y999 Unspecified external cause status: Secondary | ICD-10-CM | POA: Insufficient documentation

## 2018-06-10 DIAGNOSIS — T22111A Burn of first degree of right forearm, initial encounter: Secondary | ICD-10-CM | POA: Insufficient documentation

## 2018-06-10 DIAGNOSIS — Z79899 Other long term (current) drug therapy: Secondary | ICD-10-CM | POA: Insufficient documentation

## 2018-06-10 DIAGNOSIS — J45909 Unspecified asthma, uncomplicated: Secondary | ICD-10-CM | POA: Insufficient documentation

## 2018-06-10 DIAGNOSIS — Z87891 Personal history of nicotine dependence: Secondary | ICD-10-CM | POA: Insufficient documentation

## 2018-06-10 DIAGNOSIS — X158XXA Contact with other hot household appliances, initial encounter: Secondary | ICD-10-CM | POA: Insufficient documentation

## 2018-06-10 NOTE — ED Triage Notes (Signed)
Per Pt: Pt reports she was doing her hair and touched her arm to the bulb of doing her hair.  Pt reports she was uncomfortable lancing the blister herself.
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
## 2018-06-10 NOTE — Discharge Instructions (Signed)
Keep burn covered at least for the next 48 hours. You may clean with soap and water. You can use Neosporin or Bacitracin at home to cover burned area.

## 2018-08-26 ENCOUNTER — Encounter (HOSPITAL_COMMUNITY): Payer: Self-pay | Admitting: *Deleted

## 2018-08-26 ENCOUNTER — Emergency Department (HOSPITAL_COMMUNITY)
Admission: EM | Admit: 2018-08-26 | Discharge: 2018-08-26 | Disposition: A | Discharge status: Home / Self Care | Payer: No Typology Code available for payment source | Attending: Emergency Medicine | Admitting: Emergency Medicine

## 2018-08-26 DIAGNOSIS — Z87891 Personal history of nicotine dependence: Secondary | ICD-10-CM | POA: Insufficient documentation

## 2018-08-26 DIAGNOSIS — J45909 Unspecified asthma, uncomplicated: Secondary | ICD-10-CM | POA: Insufficient documentation

## 2018-08-26 DIAGNOSIS — R110 Nausea: Secondary | ICD-10-CM | POA: Insufficient documentation

## 2018-08-26 DIAGNOSIS — R6883 Chills (without fever): Secondary | ICD-10-CM | POA: Insufficient documentation

## 2018-08-26 DIAGNOSIS — R197 Diarrhea, unspecified: Secondary | ICD-10-CM | POA: Insufficient documentation

## 2018-08-26 LAB — CBC
HCT: 37.3 % (ref 36.0–46.0)
Hemoglobin: 12.2 g/dL (ref 12.0–15.0)
MCH: 27.7 pg (ref 26.0–34.0)
MCHC: 32.7 g/dL (ref 30.0–36.0)
MCV: 84.6 fL (ref 78.0–100.0)
PLATELETS: 327 10*3/uL (ref 150–400)
RBC: 4.41 MIL/uL (ref 3.87–5.11)
RDW: 14.5 % (ref 11.5–15.5)
WBC: 6.5 10*3/uL (ref 4.0–10.5)

## 2018-08-26 LAB — COMPREHENSIVE METABOLIC PANEL
ALT: 19 U/L (ref 0–44)
AST: 21 U/L (ref 15–41)
Albumin: 3.8 g/dL (ref 3.5–5.0)
Alkaline Phosphatase: 63 U/L (ref 38–126)
Anion gap: 9 (ref 5–15)
BUN: 14 mg/dL (ref 6–20)
CHLORIDE: 105 mmol/L (ref 98–111)
CO2: 26 mmol/L (ref 22–32)
CREATININE: 0.65 mg/dL (ref 0.44–1.00)
Calcium: 9.4 mg/dL (ref 8.9–10.3)
Glucose, Bld: 103 mg/dL — ABNORMAL HIGH (ref 70–99)
Potassium: 4.4 mmol/L (ref 3.5–5.1)
Sodium: 140 mmol/L (ref 135–145)
Total Bilirubin: 0.2 mg/dL — ABNORMAL LOW (ref 0.3–1.2)
Total Protein: 7 g/dL (ref 6.5–8.1)

## 2018-08-26 LAB — URINALYSIS, ROUTINE W REFLEX MICROSCOPIC
BILIRUBIN URINE: NEGATIVE
GLUCOSE, UA: NEGATIVE mg/dL
Ketones, ur: NEGATIVE mg/dL
LEUKOCYTES UA: NEGATIVE
NITRITE: NEGATIVE
PH: 6 (ref 5.0–8.0)
Protein, ur: NEGATIVE mg/dL
Specific Gravity, Urine: 1.013 (ref 1.005–1.030)

## 2018-08-26 LAB — LIPASE, BLOOD: LIPASE: 40 U/L (ref 11–51)

## 2018-08-26 LAB — I-STAT BETA HCG BLOOD, ED (MC, WL, AP ONLY)

## 2018-08-26 MED ORDER — ONDANSETRON HCL 4 MG PO TABS
4.0000 mg | ORAL_TABLET | Freq: Once | ORAL | Status: AC
  Administered 2018-08-26: 4 mg via ORAL
  Filled 2018-08-26: qty 1

## 2018-08-26 MED ORDER — ACETAMINOPHEN 325 MG PO TABS
650.0000 mg | ORAL_TABLET | Freq: Once | ORAL | Status: AC
  Administered 2018-08-26: 650 mg via ORAL
  Filled 2018-08-26: qty 2

## 2018-08-26 MED ORDER — ONDANSETRON HCL 4 MG PO TABS: 4.0000 mg | ORAL_TABLET | Freq: Four times a day (QID) | ORAL | 0 refills | Status: DC

## 2018-08-26 NOTE — ED Provider Notes (Signed)
[facility] DEPT Provider Note   CSN: [id number] Arrival date & time: 08/26/18  0908     History   Chief Complaint Chief Complaint  Patient presents with  . Diarrhea  . Abdominal Pain    HPI Elizabeth David is a 27 y.o. female.  27 year old female with prior medical history as detailed below presents for evaluation of diarrhea.  Patient reports diarrhea that started 2 AM this morning.  She reports multiple episodes of loose watery stool.  She denies any vomiting or fever.  She reports mild nausea.  She denies abdominal pain.  She has multiple sick contacts at her home with similar symptoms.  The history is provided by the patient.  Diarrhea   This is a new problem. The current episode started 6 to 12 hours ago. The problem occurs 2 to 4 times per day. The problem has not changed since onset.The stool consistency is described as watery. There has been no fever. Associated symptoms include chills. Pertinent negatives include no abdominal pain. She has tried nothing for the symptoms.    Past Medical History:  Diagnosis Date  . Active labor 12/04/2014  . Alcohol abuse   . Anemia   . Asthma    as child  . Genital herpes   . SVD (spontaneous vaginal delivery) 12/04/2014    Patient Active Problem List   Diagnosis Date Noted  . Substance induced mood disorder ([place]) 10/24/2017  . Active labor 12/04/2014  . SVD (spontaneous vaginal delivery) 12/04/2014  . VAGINAL DISCHARGE 06/03/2010  . FREQUENCY, URINARY 06/03/2010  . BACK STRAIN, LUMBAR 06/03/2010  . MORBID OBESITY 11/03/2009  . VITAMIN B12 DEFICIENCY 05/14/2009  . FATIGUE 05/06/2009  . DYSURIA 05/06/2009  . PAIN IN JOINT, ANKLE AND FOOT 08/13/2008    Past Surgical History:  Procedure Laterality Date  . TONSILLECTOMY     at 27 years of age     60 History    Gravida  53   Para  2   Term  1   Preterm  1   AB  1   Living  2     SAB  1   TAB      Ectopic  0   Multiple  0     Live Births  2            Home Medications    Prior to Admission medications   Medication Sig Start Date End Date Taking? Authorizing Provider  ibuprofen (ADVIL,MOTRIN) 200 MG tablet Take 800 mg by mouth every 6 (six) hours as needed (For headache or pain.).   Yes [provider]  amoxicillin-clavulanate (AUGMENTIN) 875-125 MG tablet Take 1 tablet by mouth every 12 (twelve) hours. Patient not taking: Reported on 08/26/2018 04/23/18   Ashley Murrain, NP  lidocaine (XYLOCAINE) 2 % solution Use as directed 10 mLs in the mouth or throat every 3 (three) hours as needed for mouth pain. Patient not taking: Reported on 08/26/2018 04/23/18   Ashley Murrain, NP    Family History Family History  Problem Relation Age of Onset  . Hypertension Mother   . Diabetes Mother   . Heart failure Other     Social History Social History   Tobacco Use  . Smoking status: Former Smoker    Packs/day: 0.00    Years: 3.00    Pack years: 0.00    Types: Cigarettes    Last attempt to quit: 06/16/2012    Years since quitting: 6.1  .  Smokeless tobacco: Never Used  Substance Use Topics  . Alcohol use: Yes    Comment: 1 pint of liquor daily  . Drug use: No     Allergies   Patient has no known allergies.   Review of Systems Review of Systems  Constitutional: Positive for chills.  Gastrointestinal: Positive for diarrhea. Negative for abdominal pain.  All other systems reviewed and are negative.    Physical Exam Updated Vital Signs BP (!) 142/85   Pulse 82   Temp 99 F (37.2 C) (Oral)   Resp 16   Ht 5\' 7"  (1.702 m)   Wt 104.3 kg   LMP 08/14/2018   SpO2 97%   BMI 36.02 kg/m   Physical Exam  Constitutional: She is oriented to person, place, and time. She appears well-developed and well-nourished. No distress.  HENT:  Head: Normocephalic and atraumatic.  Mouth/Throat: Oropharynx is clear and moist.  Eyes: Pupils are equal, round, and reactive to light. Conjunctivae and EOM are  normal.  Neck: Normal range of motion. Neck supple.  Cardiovascular: Normal rate, regular rhythm and normal heart sounds.  Pulmonary/Chest: Effort normal and breath sounds normal. No respiratory distress.  Abdominal: Soft. Normal appearance and bowel sounds are normal. She exhibits no distension. There is no tenderness.  Musculoskeletal: Normal range of motion. She exhibits no edema or deformity.  Neurological: She is alert and oriented to person, place, and time.  Skin: Skin is warm and dry.  Psychiatric: She has a normal mood and affect.  Nursing note and vitals reviewed.    ED Treatments / Results  Labs (all labs ordered are listed, but only abnormal results are displayed) Labs Reviewed  COMPREHENSIVE METABOLIC PANEL - Abnormal; Notable for the following components:      Result Value   Glucose, Bld 103 (*)    Total Bilirubin 0.2 (*)    All other components within normal limits  URINALYSIS, ROUTINE W REFLEX MICROSCOPIC - Abnormal; Notable for the following components:   Color, Urine STRAW (*)    Hgb urine dipstick SMALL (*)    Bacteria, UA RARE (*)    All other components within normal limits  LIPASE, BLOOD  CBC  I-STAT BETA HCG BLOOD, ED ([facility], [facility], [facility] ONLY)    EKG None  Radiology No results found.  Procedures Procedures (including critical care time)  Medications Ordered in ED Medications  ondansetron (ZOFRAN) tablet 4 mg (4 mg Oral Given 08/26/18 1020)  acetaminophen (TYLENOL) tablet 650 mg (650 mg Oral Given 08/26/18 1020)     Initial Impression / Assessment and Plan / ED Course  I have reviewed the triage vital signs and the nursing notes.  Pertinent labs & imaging results that were available during my care of the patient were reviewed by me and considered in my medical decision making (see chart for details).     MDM  Screen complete  Patient is presenting for evaluation of diarrhea.  Symptoms are most likely secondary to a viral infection.  She has sick  contacts at home with similar symptoms.  She does not appear to be toxic today.  Screening labs are without significant abnormality.  She feels improved following administration of Zofran in the ED.  She understands need for close follow-up.  Strict return precautions given and understood.  Final Clinical Impressions(s) / ED Diagnoses   Final diagnoses:  Diarrhea, unspecified type    ED Discharge Orders         Ordered    ondansetron [facility])  4 MG tablet  Every 6 hours     08/26/18 1144           Valarie Merino, MD 08/26/18 1147

## 2018-08-26 NOTE — Discharge Instructions (Signed)
Please return for any problem. Follow up with your regular physician as instructed.  °

## 2018-08-26 NOTE — ED Triage Notes (Signed)
Pt complains of diarrhea, nausea and upper abdominal pain since last night. Pt states symptoms began a few hours eating some spinach. Abd pain started after diarrhea. Pt denies emesis.

## 2018-10-28 ENCOUNTER — Ambulatory Visit (HOSPITAL_COMMUNITY)
Admission: EM | Admit: 2018-10-28 | Discharge: 2018-10-28 | Disposition: A | Discharge status: Home / Self Care | Payer: 59 | Attending: Family Medicine | Admitting: Family Medicine

## 2018-10-28 ENCOUNTER — Encounter (HOSPITAL_COMMUNITY): Payer: Self-pay

## 2018-10-28 DIAGNOSIS — R590 Localized enlarged lymph nodes: Secondary | ICD-10-CM

## 2018-10-28 DIAGNOSIS — J0140 Acute pansinusitis, unspecified: Secondary | ICD-10-CM | POA: Diagnosis not present

## 2018-10-28 MED ORDER — BENZONATATE 100 MG PO CAPS: 100.0000 mg | ORAL_CAPSULE | Freq: Three times a day (TID) | ORAL | 0 refills | Status: DC

## 2018-10-28 MED ORDER — FLUTICASONE PROPIONATE 50 MCG/ACT NA SUSP: 2.0000 | Freq: Every day | NASAL | 0 refills | Status: DC

## 2018-10-28 MED ORDER — AMOXICILLIN-POT CLAVULANATE 875-125 MG PO TABS: 1.0000 | ORAL_TABLET | Freq: Two times a day (BID) | ORAL | 0 refills | Status: DC

## 2018-10-28 MED ORDER — IPRATROPIUM BROMIDE 0.06 % NA SOLN: 2.0000 | Freq: Four times a day (QID) | NASAL | 0 refills | Status: DC

## 2018-10-28 NOTE — ED Provider Notes (Addendum)
[facility]    CSN: [id number] Arrival date & time: 10/28/18  1135     History   Chief Complaint Chief Complaint  Patient presents with  . Cough  . Congestion  . Swollen lymph nodes    HPI Elizabeth David is a 27 y.o. female.   27 year old female comes in for 1.5 to 2-week history of URI symptoms.  Started with rhinorrhea, nasal congestion, cough.  No obvious fever, chills, night sweats.  States symptoms has continued, and now with headache around the frontal region.  Noticed swollen lymph node to the left about a week ago that has been stable in size.  OTC cold medication without relief.  Former smoker.  Denies chest pain, shortness of breath, wheezing.     Past Medical History:  Diagnosis Date  . Active labor 12/04/2014  . Alcohol abuse   . Anemia   . Asthma    as child  . Genital herpes   . SVD (spontaneous vaginal delivery) 12/04/2014    Patient Active Problem List   Diagnosis Date Noted  . Substance induced mood disorder ([place]) 10/24/2017  . Active labor 12/04/2014  . SVD (spontaneous vaginal delivery) 12/04/2014  . VAGINAL DISCHARGE 06/03/2010  . FREQUENCY, URINARY 06/03/2010  . BACK STRAIN, LUMBAR 06/03/2010  . MORBID OBESITY 11/03/2009  . VITAMIN B12 DEFICIENCY 05/14/2009  . FATIGUE 05/06/2009  . DYSURIA 05/06/2009  . PAIN IN JOINT, ANKLE AND FOOT 08/13/2008    Past Surgical History:  Procedure Laterality Date  . TONSILLECTOMY     at 27 years of age    44 History    Gravida  83   Para  2   Term  1   Preterm  1   AB  1   Living  2     SAB  1   TAB      Ectopic  0   Multiple  0   Live Births  2            Home Medications    Prior to Admission medications   Medication Sig Start Date End Date Taking? Authorizing Provider  amoxicillin-clavulanate (AUGMENTIN) 875-125 MG tablet Take 1 tablet by mouth every 12 (twelve) hours. 10/28/18   Tasia Catchings, Charnel Giles V, PA-C  benzonatate (TESSALON) 100 MG capsule Take 1 capsule (100 mg  total) by mouth every 8 (eight) hours. 10/28/18   Tasia Catchings, Tamaira Ciriello V, PA-C  fluticasone (FLONASE) 50 MCG/ACT nasal spray Place 2 sprays into both nostrils daily. 10/28/18   Tasia Catchings, Shaliah Wann V, PA-C  ibuprofen (ADVIL,MOTRIN) 200 MG tablet Take 800 mg by mouth every 6 (six) hours as needed (For headache or pain.).    [provider]  ipratropium (ATROVENT) 0.06 % nasal spray Place 2 sprays into both nostrils 4 (four) times daily. 10/28/18   Tasia Catchings, Leontae Bostock V, PA-C  lidocaine (XYLOCAINE) 2 % solution Use as directed 10 mLs in the mouth or throat every 3 (three) hours as needed for mouth pain. Patient not taking: Reported on 08/26/2018 04/23/18   Ashley Murrain, NP  ondansetron (ZOFRAN) 4 MG tablet Take 1 tablet (4 mg total) by mouth every 6 (six) hours. 08/26/18   Valarie Merino, MD    Family History Family History  Problem Relation Age of Onset  . Hypertension Mother   . Diabetes Mother   . Heart failure Other     Social History Social History   Tobacco Use  . Smoking status: Former Smoker    Packs/day:  0.00    Years: 3.00    Pack years: 0.00    Types: Cigarettes    Last attempt to quit: 06/16/2012    Years since quitting: 6.3  . Smokeless tobacco: Never Used  Substance Use Topics  . Alcohol use: Yes    Comment: 1 pint of liquor daily  . Drug use: No     Allergies   Patient has no known allergies.   Review of Systems Review of Systems  Reason unable to perform ROS: See HPI as above.     Physical Exam Triage Vital Signs ED Triage Vitals  Enc Vitals Group     BP 10/28/18 1222 134/85     Pulse Rate 10/28/18 1222 94     Resp 10/28/18 1222 20     Temp 10/28/18 1222 98.3 F (36.8 C)     Temp Source 10/28/18 1222 Oral     SpO2 10/28/18 1222 100 %     Weight --      Height --      Head Circumference --      Peak Flow --      Pain Score 10/28/18 1223 6     Pain Loc --      Pain Edu? --      Excl. in [employer]? --    No data found.  Updated Vital Signs BP 134/85 (BP Location: Left Arm)    Pulse 94   Temp 98.3 F (36.8 C) (Oral)   Resp 20   LMP 10/06/2018   SpO2 100%   Physical Exam  Constitutional: She is oriented to person, place, and time. She appears well-developed and well-nourished. No distress.  HENT:  Head: Normocephalic and atraumatic.  Right Ear: Tympanic membrane, external ear and ear canal normal. Tympanic membrane is not erythematous and not bulging.  Left Ear: Tympanic membrane, external ear and ear canal normal. Tympanic membrane is not erythematous and not bulging.  Nose: Rhinorrhea present. Right sinus exhibits maxillary sinus tenderness and frontal sinus tenderness. Left sinus exhibits maxillary sinus tenderness and frontal sinus tenderness.  Mouth/Throat: Uvula is midline, oropharynx is clear and moist and mucous membranes are normal.  Eyes: Pupils are equal, round, and reactive to light. Conjunctivae are normal.  Neck: Normal range of motion. Neck supple.  Cardiovascular: Normal rate, regular rhythm and normal heart sounds. Exam reveals no gallop and no friction rub.  No murmur heard. Pulmonary/Chest: Effort normal and breath sounds normal. She has no decreased breath sounds. She has no wheezes. She has no rhonchi. She has no rales.  Lymphadenopathy:    She has cervical adenopathy.       Left cervical: Superficial cervical adenopathy present.  Neurological: She is alert and oriented to person, place, and time.  Skin: Skin is warm and dry.  Psychiatric: She has a normal mood and affect. Her behavior is normal. Judgment normal.     UC Treatments / Results  Labs (all labs ordered are listed, but only abnormal results are displayed) Labs Reviewed - No data to display  EKG None  Radiology No results found.  Procedures Procedures (including critical care time)  Medications Ordered in UC Medications - No data to display  Initial Impression / Assessment and Plan / UC Course  I have reviewed the triage vital signs and the nursing  notes.  Pertinent labs & imaging results that were available during my care of the patient were reviewed by me and considered in my medical decision making (see chart for details).  Augmentin for sinusitis. Other symptomatic treatment discussed. Return precautions given.   Final Clinical Impressions(s) / UC Diagnoses   Final diagnoses:  Acute non-recurrent pansinusitis  Lymphadenopathy of left cervical region    ED Prescriptions    Medication Sig Dispense Auth. Provider   amoxicillin-clavulanate (AUGMENTIN) 875-125 MG tablet Take 1 tablet by mouth every 12 (twelve) hours. 14 tablet Maguadalupe Lata V, PA-C   fluticasone (FLONASE) 50 MCG/ACT nasal spray Place 2 sprays into both nostrils daily. 1 g Inaki Vantine V, PA-C   ipratropium (ATROVENT) 0.06 % nasal spray Place 2 sprays into both nostrils 4 (four) times daily. 15 mL Marvel Mcphillips V, PA-C   benzonatate (TESSALON) 100 MG capsule Take 1 capsule (100 mg total) by mouth every 8 (eight) hours. 21 capsule Tobin Chad, PA-C 10/28/18 [place], Astrid Vides V, PA-C 10/28/18 1309

## 2018-10-28 NOTE — ED Triage Notes (Signed)
Pt presents with swollen lymph nodes, persistent cough, headaches and congestion.

## 2018-10-28 NOTE — Discharge Instructions (Addendum)
Augmentin as directed for sinus infection. Tessalon for cough. Start flonase, atrovent nasal spray for nasal congestion/drainage. You can use over the counter nasal saline rinse such as neti pot for nasal congestion. Keep hydrated, your urine should be clear to pale yellow in color. Tylenol/motrin for fever and pain. Monitor for any worsening of symptoms, chest pain, shortness of breath, wheezing, swelling of the throat, follow up for reevaluation.   For sore throat/cough try using a honey-based tea. Use 3 teaspoons of honey with juice squeezed from half lemon. Place shaved pieces of ginger into 1/2-1 cup of water and warm over stove top. Then mix the ingredients and repeat every 4 hours as needed.

## 2019-01-29 ENCOUNTER — Ambulatory Visit (HOSPITAL_COMMUNITY)
Admission: EM | Admit: 2019-01-29 | Discharge: 2019-01-29 | Disposition: A | Discharge status: Home / Self Care | Payer: 59 | Attending: Family Medicine | Admitting: Family Medicine

## 2019-01-29 ENCOUNTER — Encounter (HOSPITAL_COMMUNITY): Payer: Self-pay

## 2019-01-29 ENCOUNTER — Other Ambulatory Visit: Payer: Self-pay

## 2019-01-29 DIAGNOSIS — A084 Viral intestinal infection, unspecified: Secondary | ICD-10-CM

## 2019-01-29 DIAGNOSIS — Z3202 Encounter for pregnancy test, result negative: Secondary | ICD-10-CM

## 2019-01-29 LAB — POCT PREGNANCY, URINE: Preg Test, Ur: NEGATIVE

## 2019-01-29 MED ORDER — ONDANSETRON HCL 4 MG PO TABS: 4.0000 mg | ORAL_TABLET | Freq: Four times a day (QID) | ORAL | 0 refills | Status: DC

## 2019-01-29 NOTE — ED Provider Notes (Signed)
[facility]   [id number] 01/29/19 Arrival Time: [phone]  CC: nausea, vomiting, diarrhea, abdominal cramping  SUBJECTIVE:  Elizabeth David is a 28 y.o. female who presents with complaint of nausea, vomiting x 2 episodes, diarrhea x 5 episodes, and abdominal cramping that began abruptly this morning.  Reports eating at [employer] last night.  Pain is diffuse about the abdomen. Has not tried OTC medications.  Abdominal cramping improved with bowel movements.  Reports similar symptoms in the past.  Last BM this am with diarrhea.    Denies fever, chills, chest pain, SOB, constipation, hematochezia, melena, dysuria, difficulty urinating, increased frequency or urgency, flank pain, loss of bowel or bladder function.  Patient's last menstrual period was 01/02/2019.  ROS: As per HPI.  Past Medical History:  Diagnosis Date  . Active labor 12/04/2014  . Alcohol abuse   . Anemia   . Asthma    as child  . Genital herpes   . SVD (spontaneous vaginal delivery) 12/04/2014   Past Surgical History:  Procedure Laterality Date  . TONSILLECTOMY     at 28 years of age   No Known Allergies No current facility-administered medications on file prior to encounter.    Current Outpatient Medications on File Prior to Encounter  Medication Sig Dispense Refill  . ibuprofen (ADVIL,MOTRIN) 200 MG tablet Take 800 mg by mouth every 6 (six) hours as needed (For headache or pain.).     Social History   Socioeconomic History  . Marital status: Legally Separated    Spouse name: Not on file  . Number of children: Not on file  . Years of education: Not on file  . Highest education level: Not on file  Occupational History  . Not on file  Social Needs  . Financial resource strain: Not on file  . Food insecurity:    Worry: Not on file    Inability: Not on file  . Transportation needs:    Medical: Not on file    Non-medical: Not on file  Tobacco Use  . Smoking status: Former Smoker   Packs/day: 0.00    Years: 3.00    Pack years: 0.00    Types: Cigarettes    Last attempt to quit: 06/16/2012    Years since quitting: 6.6  . Smokeless tobacco: Never Used  Substance and Sexual Activity  . Alcohol use: Yes    Comment: 1 pint of liquor daily  . Drug use: No  . Sexual activity: Yes    Partners: Male    Birth control/protection: None  Lifestyle  . Physical activity:    Days per week: Not on file    Minutes per session: Not on file  . Stress: Not on file  Relationships  . Social connections:    Talks on phone: Not on file    Gets together: Not on file    Attends religious service: Not on file    Active member of club or organization: Not on file    Attends meetings of clubs or organizations: Not on file    Relationship status: Not on file  . Intimate partner violence:    Fear of current or ex partner: Not on file    Emotionally abused: Not on file    Physically abused: Not on file    Forced sexual activity: Not on file  Other Topics Concern  . Not on file  Social History Narrative  . Not on file   Family History  Problem Relation Age of  Onset  . Hypertension Mother   . Diabetes Mother   . Heart failure Other      OBJECTIVE:  Vitals:   01/29/19 0902 01/29/19 0908  BP: (!) 138/91   Pulse: 72   Resp: 18   Temp: 98.2 F (36.8 C)   TempSrc: Oral   Weight:  235 lb (106.6 kg)    General appearance: Alert; NAD HEENT: NCAT.  Oropharynx clear.  Lungs: clear to auscultation bilaterally without adventitious breath sounds Heart: regular rate and rhythm.  Radial pulses 2+ symmetrical bilaterally Abdomen: soft, non-distended; normal active bowel sounds; mild diffuse abdominal tenderness; nontender at McBurney's point; no guarding Back: no CVA tenderness Extremities: no edema; symmetrical with no gross deformities Skin: warm and dry Neurologic: normal gait Psychological: alert and cooperative; normal mood and affect  LABS: Results for orders placed or  performed during the hospital encounter of 01/29/19 (from the past 24 hour(s))  Pregnancy, urine POC     Status: None   Collection Time: 01/29/19  9:22 AM  Result Value Ref Range   Preg Test, Ur NEGATIVE NEGATIVE    ASSESSMENT & PLAN:  1. Viral gastroenteritis     Meds ordered this encounter  Medications  . ondansetron (ZOFRAN) 4 MG tablet    Sig: Take 1 tablet (4 mg total) by mouth every 6 (six) hours.    Dispense:  12 tablet    Refill:  0    Order Specific Question:   Supervising Provider    Answer:   Raylene Everts [[id number]]    Urine pregnancy negative.   Get rest and drink fluids Zofran prescribed.  Take as directed.    DIET Instructions:  30 minutes after taking nausea medicine, begin with sips of clear liquids. If able to hold down 2 - 4 ounces for 30 minutes, begin drinking more. Increase your fluid intake to replace losses. Clear liquids only for 24 hours (water, tea, sport drinks, clear flat ginger ale or cola and juices, broth, jello, popsicles, ect). Advance to bland foods, applesauce, rice, baked or boiled chicken, ect. Avoid milk, greasy foods and anything that doesn't agree with you.  If you experience new or worsening symptoms return or go to ER such as fever, chills, nausea, vomiting, diarrhea, bloody or dark tarry stools, constipation, urinary symptoms, worsening abdominal discomfort, symptoms that do not improve with medications, inability to keep fluids down, etc...  Reviewed expectations re: course of current medical issues. Questions answered. Outlined signs and symptoms indicating need for more acute intervention. Patient verbalized understanding. After Visit Summary given.   Lestine Box, PA-C 01/29/19 [phone]

## 2019-01-29 NOTE — ED Triage Notes (Signed)
Pt cc stomach pains, diarrhea , nausea and vomiting this started this morning. Pt wants to do a upreg.

## 2019-01-29 NOTE — Discharge Instructions (Signed)
Urine pregnancy negative.   Get rest and drink fluids Zofran prescribed.  Take as directed.    DIET Instructions:  30 minutes after taking nausea medicine, begin with sips of clear liquids. If able to hold down 2 - 4 ounces for 30 minutes, begin drinking more. Increase your fluid intake to replace losses. Clear liquids only for 24 hours (water, tea, sport drinks, clear flat ginger ale or cola and juices, broth, jello, popsicles, ect). Advance to bland foods, applesauce, rice, baked or boiled chicken, ect. Avoid milk, greasy foods and anything that doesnt agree with you.  If you experience new or worsening symptoms return or go to ER such as fever, chills, nausea, vomiting, diarrhea, bloody or dark tarry stools, constipation, urinary symptoms, worsening abdominal discomfort, symptoms that do not improve with medications, inability to keep fluids down, etc..Marland Kitchen

## 2019-02-13 ENCOUNTER — Emergency Department (HOSPITAL_COMMUNITY)
Admission: EM | Admit: 2019-02-13 | Discharge: 2019-02-13 | Disposition: A | Discharge status: Home / Self Care | Payer: 59 | Attending: Emergency Medicine | Admitting: Emergency Medicine

## 2019-02-13 ENCOUNTER — Other Ambulatory Visit: Payer: Self-pay

## 2019-02-13 ENCOUNTER — Encounter (HOSPITAL_COMMUNITY): Payer: Self-pay | Admitting: *Deleted

## 2019-02-13 DIAGNOSIS — Z5321 Procedure and treatment not carried out due to patient leaving prior to being seen by health care provider: Secondary | ICD-10-CM | POA: Insufficient documentation

## 2019-02-13 DIAGNOSIS — I10 Essential (primary) hypertension: Secondary | ICD-10-CM | POA: Insufficient documentation

## 2019-02-13 NOTE — ED Triage Notes (Signed)
Pt was at the dentist office this morning and had her BP checked multiple times.  She was told that her BP was elevated and that it needed to be evaluated by the ED.  Pt denies any other symptoms in triage other than some intermittant cramping near her breast area.  Pt states she recently had her nipples pierced. Pt a/o x 4 and no acute distressed noted. Pt is ambulatory.

## 2019-02-13 NOTE — ED Notes (Signed)
[place], MD at bedside to assess patient and patient not to be found in room or bathroom, LWOT assumed and MD aware.

## 2019-02-17 ENCOUNTER — Emergency Department (HOSPITAL_COMMUNITY): Payer: 59

## 2019-02-17 ENCOUNTER — Emergency Department (HOSPITAL_COMMUNITY)
Admission: EM | Admit: 2019-02-17 | Discharge: 2019-02-17 | Disposition: A | Discharge status: Home / Self Care | Payer: 59 | Attending: Emergency Medicine | Admitting: Emergency Medicine

## 2019-02-17 ENCOUNTER — Encounter (HOSPITAL_COMMUNITY): Payer: Self-pay

## 2019-02-17 ENCOUNTER — Other Ambulatory Visit: Payer: Self-pay

## 2019-02-17 DIAGNOSIS — R0789 Other chest pain: Secondary | ICD-10-CM | POA: Diagnosis not present

## 2019-02-17 DIAGNOSIS — R079 Chest pain, unspecified: Secondary | ICD-10-CM | POA: Diagnosis present

## 2019-02-17 DIAGNOSIS — F1010 Alcohol abuse, uncomplicated: Secondary | ICD-10-CM | POA: Insufficient documentation

## 2019-02-17 DIAGNOSIS — Z87891 Personal history of nicotine dependence: Secondary | ICD-10-CM | POA: Diagnosis not present

## 2019-02-17 DIAGNOSIS — J45909 Unspecified asthma, uncomplicated: Secondary | ICD-10-CM | POA: Insufficient documentation

## 2019-02-17 LAB — I-STAT BETA HCG BLOOD, ED (NOT ORDERABLE): I-stat hCG, quantitative: <5 m[IU]/mL (ref <5)

## 2019-02-17 LAB — CBC
HCT: 37.8 % (ref 36.0–46.0)
Hemoglobin: 11.5 g/dL — ABNORMAL LOW (ref 12.0–15.0)
MCH: 26.6 pg (ref 26.0–34.0)
MCHC: 30.4 g/dL (ref 30.0–36.0)
MCV: 87.3 fL (ref 80.0–100.0)
PLATELETS: 321 10*3/uL (ref 150–400)
RBC: 4.33 MIL/uL (ref 3.87–5.11)
RDW: 14.5 % (ref 11.5–15.5)
WBC: 6.4 10*3/uL (ref 4.0–10.5)
nRBC: 0 % (ref 0.0–0.2)

## 2019-02-17 LAB — POCT I-STAT TROPONIN I: TROPONIN I, POC: 0.01 ng/mL (ref 0.00–0.08)

## 2019-02-17 LAB — BASIC METABOLIC PANEL
ANION GAP: 7 (ref 5–15)
BUN: 14 mg/dL (ref 6–20)
CHLORIDE: 107 mmol/L (ref 98–111)
CO2: 26 mmol/L (ref 22–32)
CREATININE: 0.66 mg/dL (ref 0.44–1.00)
Calcium: 9 mg/dL (ref 8.9–10.3)
GFR calc non Af Amer: >60 mL/min (ref >60)
Glucose, Bld: 98 mg/dL (ref 70–99)
POTASSIUM: 3.6 mmol/L (ref 3.5–5.1)
SODIUM: 140 mmol/L (ref 135–145)

## 2019-02-17 MED ORDER — SODIUM CHLORIDE 0.9% FLUSH
3.0000 mL | Freq: Once | INTRAVENOUS | Status: AC
  Administered 2019-02-17: 3 mL via INTRAVENOUS

## 2019-02-17 NOTE — Discharge Instructions (Addendum)
Ibuprofen 600 mg every 6 hours as needed for pain.  Follow-up with primary doctor if symptoms or not improving in the next 3 to 4 days.

## 2019-02-17 NOTE — ED Notes (Signed)
Bed: WA03 Expected date:  Expected time:  Means of arrival:  Comments: 

## 2019-02-17 NOTE — ED Provider Notes (Signed)
[name] DEPT Provider Note   CSN: [id number] Arrival date & time: 02/17/19  0946    History   Chief Complaint Chief Complaint  Patient presents with  . Chest Pain  . Headache  . Nausea    HPI Elizabeth David is a 28 y.o. female.     Patient is a 28 year old female with past medical history of asthma, anemia.  She presents today for evaluation of chest discomfort.  This began several days ago in the absence of any injury or trauma.  She describes a sharp pain to the center of her chest with no associated shortness of breath, nausea, diaphoresis, or radiation.  She denies any leg pain or swelling.  She denies any fevers, chills, or cough.  The history is provided by the patient.  Chest Pain  Pain location:  Substernal area Pain quality: sharp   Pain radiates to:  Does not radiate Pain severity:  Moderate Onset quality:  Gradual Duration:  3 days Chronicity:  New Relieved by:  Nothing Worsened by:  Nothing Ineffective treatments:  None tried Associated symptoms: headache   Headache    Past Medical History:  Diagnosis Date  . Active labor 12/04/2014  . Alcohol abuse   . Anemia   . Asthma    as child  . Genital herpes   . SVD (spontaneous vaginal delivery) 12/04/2014    Patient Active Problem List   Diagnosis Date Noted  . Substance induced mood disorder ([place]) 10/24/2017  . Active labor 12/04/2014  . SVD (spontaneous vaginal delivery) 12/04/2014  . VAGINAL DISCHARGE 06/03/2010  . FREQUENCY, URINARY 06/03/2010  . BACK STRAIN, LUMBAR 06/03/2010  . MORBID OBESITY 11/03/2009  . VITAMIN B12 DEFICIENCY 05/14/2009  . FATIGUE 05/06/2009  . DYSURIA 05/06/2009  . PAIN IN JOINT, ANKLE AND FOOT 08/13/2008    Past Surgical History:  Procedure Laterality Date  . TONSILLECTOMY     at 28 years of age     53 History    Gravida  37   Para  2   Term  1   Preterm  1   AB  1   Living  2     SAB  1   TAB      Ectopic  0   Multiple  0   Live Births  2            Home Medications    Prior to Admission medications   Medication Sig Start Date End Date Taking? Authorizing Provider  ibuprofen (ADVIL,MOTRIN) 200 MG tablet Take 800 mg by mouth every 6 (six) hours as needed (For headache or pain.).    [provider]  ondansetron (ZOFRAN) 4 MG tablet Take 1 tablet (4 mg total) by mouth every 6 (six) hours. 01/29/19   Lestine Box, PA-C    Family History Family History  Problem Relation Age of Onset  . Hypertension Mother   . Diabetes Mother   . Heart failure Other     Social History Social History   Tobacco Use  . Smoking status: Former Smoker    Packs/day: 0.00    Years: 3.00    Pack years: 0.00    Types: Cigarettes    Last attempt to quit: 06/16/2012    Years since quitting: 6.6  . Smokeless tobacco: Never Used  Substance Use Topics  . Alcohol use: Yes    Alcohol/week: 7.0 standard drinks    Types: 7 Standard drinks or equivalent per week  .  Drug use: No     Allergies   Patient has no known allergies.   Review of Systems Review of Systems  Cardiovascular: Positive for chest pain.  Neurological: Positive for headaches.  All other systems reviewed and are negative.    Physical Exam Updated Vital Signs BP (!) 147/103 (BP Location: Right Arm)   Pulse (!) 111   Temp 98.5 F (36.9 C) (Oral)   Resp 15   Ht 5\' 5"  (1.651 m)   Wt 107 kg   LMP 01/29/2019   SpO2 100%   BMI 39.27 kg/m   Physical Exam Vitals signs and nursing note reviewed.  Constitutional:      General: She is not in acute distress.    Appearance: She is well-developed. She is not diaphoretic.  HENT:     Head: Normocephalic and atraumatic.  Neck:     Musculoskeletal: Normal range of motion and neck supple.  Cardiovascular:     Rate and Rhythm: Normal rate and regular rhythm.     Heart sounds: No murmur. No friction rub. No gallop.   Pulmonary:     Effort: Pulmonary effort is normal. No  respiratory distress.     Breath sounds: Normal breath sounds. No wheezing.  Abdominal:     General: Bowel sounds are normal. There is no distension.     Palpations: Abdomen is soft.     Tenderness: There is no abdominal tenderness.  Musculoskeletal: Normal range of motion.  Skin:    General: Skin is warm and dry.  Neurological:     Mental Status: She is alert and oriented to person, place, and time.      ED Treatments / Results  Labs (all labs ordered are listed, but only abnormal results are displayed) Labs Reviewed  BASIC METABOLIC PANEL  CBC  I-STAT TROPONIN, ED  I-STAT BETA HCG BLOOD, ED ([facility], [facility], [facility] ONLY)    EKG EKG Interpretation  Date/Time:  Monday February 17 2019 10:10:09 EDT Ventricular Rate:  117 PR Interval:    QRS Duration: 86 QT Interval:  347 QTC Calculation: 485 R Axis:   71 Text Interpretation:  Sinus tachycardia Borderline T abnormalities, diffuse leads Borderline prolonged QT interval Confirmed by Veryl Speak [phone]) on 02/17/2019 10:53:24 AM   Radiology Dg Chest 2 View  Result Date: 02/17/2019 CLINICAL DATA:  Mid chest pain for 2 days EXAM: CHEST - 2 VIEW COMPARISON:  04/23/18 FINDINGS: The heart size and mediastinal contours are within normal limits. Both lungs are clear. The visualized skeletal structures are unremarkable. IMPRESSION: No active cardiopulmonary disease. Electronically Signed   By: Inez Catalina M.D.   On: 02/17/2019 11:13    Procedures Procedures (including critical care time)  Medications Ordered in ED Medications  sodium chloride flush (NS) 0.9 % injection 3 mL (has no administration in time range)     Initial Impression / Assessment and Plan / ED Course  I have reviewed the triage vital signs and the nursing notes.  Pertinent labs & imaging results that were available during my care of the patient were reviewed by me and considered in my medical decision making (see chart for details).  Patient presents here with complaints  of chest pain, headache, and nausea.  This has worsened over the past several days.  Her symptoms are atypical for cardiac pain and EKG and troponin are reassuring.  I see no other obvious abnormalities in the work-up.  I am uncertain as to what is causing the patient's discomfort, however I  suspect a musculoskeletal etiology.  Patient will be discharged with ibuprofen, rest, and follow-up as needed if not improving.  Final Clinical Impressions(s) / ED Diagnoses   Final diagnoses:  None    ED Discharge Orders    None       Veryl Speak, MD 02/17/19 1408

## 2019-02-17 NOTE — ED Triage Notes (Signed)
Patient c/o intermittent mid chest pain x 2 days. Patient denies any SOB,  Patient also c/o headache and nausea x 2 weeks. Patient denies any blurred vision or sensitivity to light.

## 2019-02-24 ENCOUNTER — Ambulatory Visit (HOSPITAL_COMMUNITY)
Admission: EM | Admit: 2019-02-24 | Discharge: 2019-02-24 | Disposition: A | Discharge status: Home / Self Care | Payer: 59 | Attending: Internal Medicine | Admitting: Internal Medicine

## 2019-02-24 ENCOUNTER — Encounter (HOSPITAL_COMMUNITY): Payer: Self-pay | Admitting: Emergency Medicine

## 2019-02-24 DIAGNOSIS — J40 Bronchitis, not specified as acute or chronic: Secondary | ICD-10-CM | POA: Diagnosis not present

## 2019-02-24 DIAGNOSIS — J322 Chronic ethmoidal sinusitis: Secondary | ICD-10-CM | POA: Diagnosis not present

## 2019-02-24 MED ORDER — DOXYCYCLINE HYCLATE 100 MG PO TABS: 100.0000 mg | ORAL_TABLET | Freq: Two times a day (BID) | ORAL | 0 refills | Status: DC

## 2019-02-24 NOTE — ED Triage Notes (Signed)
Pt here with URI sx and nasal congestion and cough

## 2019-02-24 NOTE — Discharge Instructions (Addendum)
So saline rinses twice a day for 5-7 days to help sinus pressure and stuffiness. Look up how to use a Neti Pot on youtub.   Continue using the Flonase for full 7 days.   I am placing you on antibiotics since you are a smoker and your HA may be from your sinuses being inflamed, so it will cover sinus infection as well.

## 2019-02-24 NOTE — ED Provider Notes (Signed)
[facility]    CSN: [id number] Arrival date & time: 02/24/19  7628     History   Chief Complaint Chief Complaint  Patient presents with  . URI    HPI Elizabeth David is a 28 y.o. female.   Onset of URI 3 days ago and has been feeling hot  And cold but did not take her temp. Has a burning ache, chest pressure  not related to cough x 2 days. Has a productive cough with yellow mucous.  Has been taking OTC meds, and Flonase and med for rhinitis. Today what bothers her more with stuffy nose. Has also been having HA on R forehead area for a week. Has had low back and leg aches x 2 days. Denies GI  Or GU symptoms. Denies traveling.  She used to have asthma as a child but has not been bothered by this as an adult. She stopped smoking cigts 4 years ago. Uses mariguana once a day.      Past Medical History:  Diagnosis Date  . Active labor 12/04/2014  . Alcohol abuse   . Anemia   . Asthma    as child  . Genital herpes   . SVD (spontaneous vaginal delivery) 12/04/2014    Patient Active Problem List   Diagnosis Date Noted  . Substance induced mood disorder ([place]) 10/24/2017  . Active labor 12/04/2014  . SVD (spontaneous vaginal delivery) 12/04/2014  . VAGINAL DISCHARGE 06/03/2010  . FREQUENCY, URINARY 06/03/2010  . BACK STRAIN, LUMBAR 06/03/2010  . MORBID OBESITY 11/03/2009  . VITAMIN B12 DEFICIENCY 05/14/2009  . FATIGUE 05/06/2009  . DYSURIA 05/06/2009  . PAIN IN JOINT, ANKLE AND FOOT 08/13/2008    Past Surgical History:  Procedure Laterality Date  . TONSILLECTOMY     at 28 years of age    14 History    Gravida  73   Para  2   Term  1   Preterm  1   AB  1   Living  2     SAB  1   TAB      Ectopic  0   Multiple  0   Live Births  2            Home Medications    Prior to Admission medications   Medication Sig Start Date End Date Taking? Authorizing Provider  doxycycline (VIBRA-TABS) 100 MG tablet Take 1 tablet (100 mg total) by  mouth 2 (two) times daily. 02/24/19   Rodriguez-Southworth, Sunday Spillers, PA-C  ibuprofen (ADVIL,MOTRIN) 200 MG tablet Take 800 mg by mouth every 6 (six) hours as needed (For headache or pain.).    [provider]    Family History Family History  Problem Relation Age of Onset  . Hypertension Mother   . Diabetes Mother   . Heart failure Other     Social History Social History   Tobacco Use  . Smoking status: Former Smoker    Packs/day: 0.00    Years: 3.00    Pack years: 0.00    Types: Cigarettes    Last attempt to quit: 06/16/2012    Years since quitting: 6.6  . Smokeless tobacco: Never Used  Substance Use Topics  . Alcohol use: Yes    Alcohol/week: 7.0 standard drinks    Types: 7 Standard drinks or equivalent per week  . Drug use: No     Allergies   Patient has no known allergies.   Review of Systems Review of Systems  Constitutional: Positive for chills. Negative for diaphoresis, fatigue and fever.  HENT: Positive for congestion, postnasal drip and rhinorrhea. Negative for ear discharge, ear pain, sore throat and trouble swallowing.   Eyes: Negative for discharge and itching.  Respiratory: Positive for cough and chest tightness. Negative for shortness of breath and wheezing.   Cardiovascular: Positive for chest pain. Negative for palpitations and leg swelling.  Gastrointestinal: Negative for abdominal pain, nausea and vomiting.  Genitourinary: Negative for difficulty urinating.  Musculoskeletal: Positive for myalgias. Negative for gait problem.  Skin: Negative for rash.  Neurological: Positive for headaches.  Hematological: Negative for adenopathy.    EKG- NSR  Physical Exam Triage Vital Signs ED Triage Vitals  Enc Vitals Group     BP 02/24/19 1014 135/88     Pulse Rate 02/24/19 1014 88     Resp 02/24/19 1014 18     Temp 02/24/19 1014 98.1 F (36.7 C)     Temp Source 02/24/19 1014 Oral     SpO2 02/24/19 1014 99 %     Weight --      Height --       Head Circumference --      Peak Flow --      Pain Score 02/24/19 1012 4     Pain Loc --      Pain Edu? --      Excl. in [place]? --    No data found.  Updated Vital Signs BP 135/88 (BP Location: Left Arm)   Pulse 88   Temp 98.1 F (36.7 C) (Oral)   Resp 18   LMP 01/29/2019   SpO2 99%   Visual Acuity Right Eye Distance:   Left Eye Distance:   Bilateral Distance:    Right Eye Near:   Left Eye Near:    Bilateral Near:     Physical Exam Constitutional:      General: She is not in acute distress.    Appearance: Normal appearance. She is not toxic-appearing.  HENT:     Head: Normocephalic.     Right Ear: Tympanic membrane, ear canal and external ear normal.     Left Ear: Tympanic membrane, ear canal and external ear normal.     Nose: Congestion and rhinorrhea present.     Comments: Has tender R frontal and ethmoid sinuses.     Mouth/Throat:     Mouth: Mucous membranes are moist.     Pharynx: Oropharynx is clear.  Eyes:     General: No scleral icterus.       Right eye: No discharge.        Left eye: No discharge.     Extraocular Movements: Extraocular movements intact.     Conjunctiva/sclera: Conjunctivae normal.     Pupils: Pupils are equal, round, and reactive to light.  Neck:     Musculoskeletal: Neck supple. No neck rigidity or muscular tenderness.  Cardiovascular:     Rate and Rhythm: Normal rate and regular rhythm.     Heart sounds: No murmur.  Pulmonary:     Effort: Pulmonary effort is normal.     Breath sounds: Normal breath sounds. No wheezing, rhonchi or rales.  Chest:     Chest wall: No tenderness.  Musculoskeletal: Normal range of motion.  Lymphadenopathy:     Cervical: No cervical adenopathy.  Skin:    General: Skin is warm and dry.     Findings: No rash.  Neurological:     Mental Status: She is alert and oriented  to person, place, and time.     Gait: Gait normal.  Psychiatric:        Mood and Affect: Mood normal.        Behavior: Behavior normal.         Thought Content: Thought content normal.        Judgment: Judgment normal.    UC Treatments / Results  Labs (all labs ordered are listed, but only abnormal results are displayed) Labs Reviewed - No data to display  EKG None  Radiology No results found.  Procedures Procedures  Medications Ordered in UC Medications - No data to display  Initial Impression / Assessment and Plan / UC Course  I have reviewed the triage vital signs and the nursing notes. I am concerned since she is a smoker and her R frontal and eithmoid sinuses are tender she is getting secondary infection. I dont suspect her having the Flu. I advised her to do saline nose rinses, I placed her on Doxy and advised her to continue using the Flonase.  EKG today was normal, so I believe the chest discomfort is bronchial.    Final Clinical Impressions(s) / UC Diagnoses   Final diagnoses:  Bronchitis  Ethmoid sinusitis, unspecified chronicity     Discharge Instructions     So saline rinses twice a day for 5-7 days to help sinus pressure and stuffiness. Look up how to use a Neti Pot on youtub.   Continue using the Flonase for full 7 days.   I am placing you on antibiotics since you are a smoker and your HA may be from your sinuses being inflamed, so it will cover sinus infection as well.      ED Prescriptions    Medication Sig Dispense Auth. Provider   doxycycline (VIBRA-TABS) 100 MG tablet Take 1 tablet (100 mg total) by mouth 2 (two) times daily. 14 tablet Rodriguez-Southworth, Sunday Spillers, PA-C     Controlled Substance Prescriptions [place] Controlled Substance Registry consulted? no   Shelby Mattocks, [place] 02/24/19 1128

## 2019-09-12 ENCOUNTER — Encounter (HOSPITAL_COMMUNITY): Payer: Self-pay

## 2019-09-12 ENCOUNTER — Other Ambulatory Visit: Payer: Self-pay

## 2019-09-12 ENCOUNTER — Emergency Department (HOSPITAL_COMMUNITY)
Admission: EM | Admit: 2019-09-12 | Discharge: 2019-09-12 | Disposition: A | Discharge status: Home / Self Care | Payer: No Typology Code available for payment source | Attending: Emergency Medicine | Admitting: Emergency Medicine

## 2019-09-12 DIAGNOSIS — M5431 Sciatica, right side: Secondary | ICD-10-CM

## 2019-09-12 DIAGNOSIS — Y9389 Activity, other specified: Secondary | ICD-10-CM | POA: Insufficient documentation

## 2019-09-12 DIAGNOSIS — Y929 Unspecified place or not applicable: Secondary | ICD-10-CM | POA: Insufficient documentation

## 2019-09-12 DIAGNOSIS — X500XXA Overexertion from strenuous movement or load, initial encounter: Secondary | ICD-10-CM | POA: Insufficient documentation

## 2019-09-12 DIAGNOSIS — S39012A Strain of muscle, fascia and tendon of lower back, initial encounter: Secondary | ICD-10-CM

## 2019-09-12 DIAGNOSIS — Z87891 Personal history of nicotine dependence: Secondary | ICD-10-CM | POA: Insufficient documentation

## 2019-09-12 DIAGNOSIS — Y999 Unspecified external cause status: Secondary | ICD-10-CM | POA: Insufficient documentation

## 2019-09-12 DIAGNOSIS — Z79899 Other long term (current) drug therapy: Secondary | ICD-10-CM | POA: Insufficient documentation

## 2019-09-12 MED ORDER — METHOCARBAMOL 500 MG PO TABS: 500.0000 mg | ORAL_TABLET | Freq: Two times a day (BID) | ORAL | 0 refills | Status: DC

## 2019-09-12 MED ORDER — METHYLPREDNISOLONE 4 MG PO TBPK: ORAL_TABLET | ORAL | 0 refills | Status: DC

## 2019-09-12 NOTE — ED Notes (Signed)
Pt verbalizes understanding of DC instructions. Pt belongings returned and is ambulatory out of ED.  

## 2019-09-12 NOTE — ED Provider Notes (Signed)
[facility] DEPT Provider Note   CSN: [id number] Arrival date & time: 09/12/19  0818     History   Chief Complaint Chief Complaint  Patient presents with   Back Pain    HPI Elizabeth David is a 28 y.o. female with a past medical history of asthma, obesity presents to ED for 5-day history of right-sided lower back pain radiating to right lower extremity.  Reports a aching pain, worse with movement and palpation.  States that symptoms began after a day of moving furniture.  States that she also started her menstrual cycle and is unsure if this contributed to the pain as well.  She has tried NSAIDs, warm baths with Epsom salts with only mild improvement in her symptoms.  She denies any injuries or falls, numbness in arms or legs, prior back surgeries, cough, shortness of breath, fever, dysuria, loss of bowel or bladder function, prior back surgeries, vaginal complaints.  She denies possibility of pregnancy.     HPI  Past Medical History:  Diagnosis Date   Active labor 12/04/2014   Alcohol abuse    Anemia    Asthma    as child   Genital herpes    SVD (spontaneous vaginal delivery) 12/04/2014    Patient Active Problem List   Diagnosis Date Noted   Substance induced mood disorder ([place]) 10/24/2017   Active labor 12/04/2014   SVD (spontaneous vaginal delivery) 12/04/2014   VAGINAL DISCHARGE 06/03/2010   FREQUENCY, URINARY 06/03/2010   BACK STRAIN, LUMBAR 06/03/2010   MORBID OBESITY 11/03/2009   VITAMIN B12 DEFICIENCY 05/14/2009   FATIGUE 05/06/2009   DYSURIA 05/06/2009   PAIN IN JOINT, ANKLE AND FOOT 08/13/2008    Past Surgical History:  Procedure Laterality Date   TONSILLECTOMY     at 28 years of age     [place] History    Gravida  65   Para  2   Term  1   Preterm  1   AB  1   Living  2     SAB  1   TAB      Ectopic  0   Multiple  0   Live Births  2            Home Medications    Prior to  Admission medications   Medication Sig Start Date End Date Taking? Authorizing Provider  doxycycline (VIBRA-TABS) 100 MG tablet Take 1 tablet (100 mg total) by mouth 2 (two) times daily. 02/24/19   Rodriguez-Southworth, Sunday Spillers, PA-C  ibuprofen (ADVIL,MOTRIN) 200 MG tablet Take 800 mg by mouth every 6 (six) hours as needed (For headache or pain.).    [provider]  methocarbamol (ROBAXIN) 500 MG tablet Take 1 tablet (500 mg total) by mouth 2 (two) times daily. 09/12/19   Samnang Shugars, PA-C  methylPREDNISolone (MEDROL DOSEPAK) 4 MG TBPK tablet Taper over 6 days. 09/12/19   Delia Heady, PA-C    Family History Family History  Problem Relation Age of Onset   Hypertension Mother    Diabetes Mother    Heart failure Other     Social History Social History   Tobacco Use   Smoking status: Former Smoker    Packs/day: 0.00    Years: 3.00    Pack years: 0.00    Types: Cigarettes    Quit date: 06/16/2012    Years since quitting: 7.2   Smokeless tobacco: Never Used  Substance Use Topics   Alcohol use: Yes  Alcohol/week: 7.0 standard drinks    Types: 7 Standard drinks or equivalent per week   Drug use: No     Allergies   Patient has no known allergies.   Review of Systems Review of Systems  Constitutional: Negative for fever.  Respiratory: Negative for shortness of breath.   Genitourinary: Negative for dysuria and pelvic pain.  Musculoskeletal: Positive for back pain and myalgias.  Neurological: Negative for numbness.     Physical Exam Updated Vital Signs BP (!) 151/83 (BP Location: Left Arm)    Pulse 64    Temp 98.6 F (37 C) (Oral)    Resp 16    Ht 5\' 6"  (1.676 m)    Wt 104.3 kg    LMP 09/11/2019    SpO2 100%    BMI 37.12 kg/m   Physical Exam Vitals signs and nursing note reviewed.  Constitutional:      General: She is not in acute distress.    Appearance: She is well-developed. She is not diaphoretic.     Comments: Ambulatory without difficulty.  HENT:      Head: Normocephalic and atraumatic.  Eyes:     General: No scleral icterus.    Conjunctiva/sclera: Conjunctivae normal.  Neck:     Musculoskeletal: Normal range of motion.  Cardiovascular:     Rate and Rhythm: Normal rate and regular rhythm.     Heart sounds: Normal heart sounds.  Pulmonary:     Effort: Pulmonary effort is normal. No respiratory distress.  Musculoskeletal:        General: Tenderness present.       Back:     Comments: Tenderness to palpation of the indicated area of the right paraspinal musculature of the lumbar spine as noted in the image.  No midline spinal tenderness present in lumbar, thoracic or cervical spine. No step-off palpated. No visible bruising, edema or temperature change noted. No objective signs of numbness present. No saddle anesthesia. 2+ DP pulses bilaterally. Sensation intact to light touch. Strength 5/5 in bilateral lower extremities.  Skin:    Findings: No rash.  Neurological:     Mental Status: She is alert.      ED Treatments / Results  Labs (all labs ordered are listed, but only abnormal results are displayed) Labs Reviewed - No data to display  EKG None  Radiology No results found.  Procedures Procedures (including critical care time)  Medications Ordered in ED Medications - No data to display   Initial Impression / Assessment and Plan / ED Course  I have reviewed the triage vital signs and the nursing notes.  Pertinent labs & imaging results that were available during my care of the patient were reviewed by me and considered in my medical decision making (see chart for details).        Patient denies any concerning symptoms suggestive of cauda equina requiring urgent imaging at this time such as loss of sensation in the lower extremities, lower extremity weakness, loss of bowel or bladder control, saddle anesthesia, urinary retention, fever/chills, IVDU. Exam demonstrated no  weakness on exam today. No preceding injury  or trauma to suggest acute fracture. Doubt pelvic or urinary pathology for patient's acute back pain, as patient denies urinary symptoms, denies possibility of pregnancy.  Doubt AAA as cause of patient's back pain as patient lacks major risk factors, had no abdominal TTP, and has symmetric and intact distal pulses. Patient given strict return precautions for any symptoms indicating worsening neurologic function in the lower  extremities. Suspect that pain is musculoskeletal in nature as she reports strenuous activity prior to onset and pain is reproducible to palpation.  Will give steroid course for sciatic symptoms, muscle relaxer and continued heat therapy and stretching.  Patient agreeable to plan.  Vies to return for worsening symptoms.  Patient is hemodynamically stable, in NAD, and able to ambulate in the ED. Evaluation does not show pathology that would require ongoing emergent intervention or inpatient treatment. I explained the diagnosis to the patient. Pain has been managed and has no complaints prior to discharge. Patient is comfortable with above plan and is stable for discharge at this time. All questions were answered prior to disposition. Strict return precautions for returning to the ED were discussed. Encouraged follow up with PCP.   An After Visit Summary was printed and given to the patient.   Portions of this note were generated with [employer]. Dictation errors may occur despite best attempts at proofreading.   Final Clinical Impressions(s) / ED Diagnoses   Final diagnoses:  Strain of lumbar region, initial encounter  Sciatica of right side    ED Discharge Orders         Ordered    methylPREDNISolone (MEDROL DOSEPAK) 4 MG TBPK tablet     09/12/19 0855    methocarbamol (ROBAXIN) 500 MG tablet  2 times daily     09/12/19 0855           Delia Heady, PA-C 09/12/19 [id number]    Charlesetta Shanks, MD 09/12/19 1037

## 2019-09-12 NOTE — ED Triage Notes (Signed)
Patient c/o right lower back pain that radiates into the right thigh.

## 2019-09-12 NOTE — Discharge Instructions (Signed)
Take the following medications to help with your symptoms. Follow-up with your primary care provider. Return to the ED if you start to experience worsening symptoms, numbness in arms or legs, injuries or falls, losing control of your bowels or bladder or chest pain.

## 2019-10-04 ENCOUNTER — Encounter (HOSPITAL_COMMUNITY): Payer: Self-pay | Admitting: Emergency Medicine

## 2019-10-04 ENCOUNTER — Other Ambulatory Visit (HOSPITAL_COMMUNITY): Payer: No Typology Code available for payment source

## 2019-10-04 ENCOUNTER — Emergency Department (HOSPITAL_COMMUNITY): Payer: Self-pay

## 2019-10-04 ENCOUNTER — Emergency Department (HOSPITAL_COMMUNITY)
Admission: EM | Admit: 2019-10-04 | Discharge: 2019-10-04 | Disposition: A | Discharge status: Home / Self Care | Payer: Self-pay | Attending: Emergency Medicine | Admitting: Emergency Medicine

## 2019-10-04 ENCOUNTER — Other Ambulatory Visit: Payer: Self-pay

## 2019-10-04 DIAGNOSIS — Z79899 Other long term (current) drug therapy: Secondary | ICD-10-CM | POA: Insufficient documentation

## 2019-10-04 DIAGNOSIS — R102 Pelvic and perineal pain: Secondary | ICD-10-CM | POA: Insufficient documentation

## 2019-10-04 DIAGNOSIS — Z87891 Personal history of nicotine dependence: Secondary | ICD-10-CM | POA: Insufficient documentation

## 2019-10-04 DIAGNOSIS — D219 Benign neoplasm of connective and other soft tissue, unspecified: Secondary | ICD-10-CM | POA: Insufficient documentation

## 2019-10-04 LAB — CBC WITH DIFFERENTIAL/PLATELET
Abs Immature Granulocytes: 0.05 10*3/uL (ref 0.00–0.07)
Basophils Absolute: 0 10*3/uL (ref 0.0–0.1)
Basophils Relative: 0 %
Eosinophils Absolute: 0 10*3/uL (ref 0.0–0.5)
Eosinophils Relative: 0 %
HCT: 38 % (ref 36.0–46.0)
Hemoglobin: 12.3 g/dL (ref 12.0–15.0)
Immature Granulocytes: 1 %
Lymphocytes Relative: 27 %
Lymphs Abs: 2.3 10*3/uL (ref 0.7–4.0)
MCH: 27.5 pg (ref 26.0–34.0)
MCHC: 32.4 g/dL (ref 30.0–36.0)
MCV: 85 fL (ref 80.0–100.0)
Monocytes Absolute: 0.7 10*3/uL (ref 0.1–1.0)
Monocytes Relative: 9 %
Neutro Abs: 5.5 10*3/uL (ref 1.7–7.7)
Neutrophils Relative %: 63 %
Platelets: 320 10*3/uL (ref 150–400)
RBC: 4.47 MIL/uL (ref 3.87–5.11)
RDW: 15.1 % (ref 11.5–15.5)
WBC: 8.7 10*3/uL (ref 4.0–10.5)
nRBC: 0 % (ref 0.0–0.2)

## 2019-10-04 LAB — COMPREHENSIVE METABOLIC PANEL
ALT: 16 U/L (ref 0–44)
AST: 22 U/L (ref 15–41)
Albumin: 3.3 g/dL — ABNORMAL LOW (ref 3.5–5.0)
Alkaline Phosphatase: 52 U/L (ref 38–126)
Anion gap: 11 (ref 5–15)
BUN: 12 mg/dL (ref 6–20)
CO2: 24 mmol/L (ref 22–32)
Calcium: 9.2 mg/dL (ref 8.9–10.3)
Chloride: 105 mmol/L (ref 98–111)
Creatinine, Ser: 0.86 mg/dL (ref 0.44–1.00)
GFR calc Af Amer: >60 mL/min (ref >60)
GFR calc non Af Amer: >60 mL/min (ref >60)
Glucose, Bld: 96 mg/dL (ref 70–99)
Potassium: 3.8 mmol/L (ref 3.5–5.1)
Sodium: 140 mmol/L (ref 135–145)
Total Bilirubin: 0.1 mg/dL — ABNORMAL LOW (ref 0.3–1.2)
Total Protein: 6 g/dL — ABNORMAL LOW (ref 6.5–8.1)

## 2019-10-04 LAB — URINALYSIS, ROUTINE W REFLEX MICROSCOPIC
Bacteria, UA: NONE SEEN
Bilirubin Urine: NEGATIVE
Glucose, UA: NEGATIVE mg/dL
Ketones, ur: NEGATIVE mg/dL
Nitrite: NEGATIVE
Protein, ur: 100 mg/dL — AB
RBC / HPF: >50 RBC/hpf — ABNORMAL HIGH (ref 0–5)
Specific Gravity, Urine: 1.025 (ref 1.005–1.030)
WBC, UA: >50 WBC/hpf — ABNORMAL HIGH (ref 0–5)
pH: 6 (ref 5.0–8.0)

## 2019-10-04 LAB — WET PREP, GENITAL
Sperm: NONE SEEN
Trich, Wet Prep: NONE SEEN
Yeast Wet Prep HPF POC: NONE SEEN

## 2019-10-04 LAB — LIPASE, BLOOD: Lipase: 33 U/L (ref 11–51)

## 2019-10-04 LAB — PREGNANCY, URINE: Preg Test, Ur: NEGATIVE

## 2019-10-04 MED ORDER — MORPHINE SULFATE (PF) 4 MG/ML IV SOLN
4.0000 mg | Freq: Once | INTRAVENOUS | Status: AC
  Administered 2019-10-04: 4 mg via INTRAVENOUS
  Filled 2019-10-04: qty 1

## 2019-10-04 MED ORDER — HYDROCODONE-ACETAMINOPHEN 5-325 MG PO TABS: 1.0000 | ORAL_TABLET | ORAL | 0 refills | Status: DC | PRN

## 2019-10-04 MED ORDER — ONDANSETRON HCL 4 MG/2ML IJ SOLN
4.0000 mg | Freq: Once | INTRAMUSCULAR | Status: AC
  Administered 2019-10-04: 09:00:00 4 mg via INTRAVENOUS
  Filled 2019-10-04: qty 2

## 2019-10-04 MED ORDER — KETOROLAC TROMETHAMINE 15 MG/ML IJ SOLN
15.0000 mg | Freq: Once | INTRAMUSCULAR | Status: AC
  Administered 2019-10-04: 15 mg via INTRAVENOUS
  Filled 2019-10-04: qty 1

## 2019-10-04 NOTE — ED Notes (Signed)
Patient aware of need for urine culture sample, pt provided urine cup and ambulatory to restroom at this time.

## 2019-10-04 NOTE — ED Notes (Signed)
Pt given sprite 

## 2019-10-04 NOTE — ED Provider Notes (Signed)
[facility] EMERGENCY DEPARTMENT Provider Note   CSN: [id number] Arrival date & time: 10/04/19  [id number]     History   Chief Complaint Chief Complaint  Patient presents with  . Abdominal Pain    HPI Elizabeth David is a 28 y.o.  [id number]  female who present with abdominal pain. PMH significant for asthma, anemia, hx of ETOH abuse, herpes.  Patient states that she has a history of fibroids which was diagnosed 2 years ago and every time her period starts her pain and cramping seems to be getting worse and worse.  She reports severe pelvic and low back pain.  LMP was yesterday and has been having heavy vaginal bleeding.  Pain feels like she is in labor.  She has been prescribed muscle relaxers for her pain which has not helped.  She has been trying warm bath which also did not help.  The pain was too severe this morning therefore she came to the ED.  She denies dysuria but feels pressure in her abdomen when she has to urinate.  No fevers.  She has nausea but no vomiting. She is sexually active with one partner. She denies pain with sex or vaginal discharge. She previously saw [facility] but stopped when she lost her job because she lost her insurance.      HPI  Past Medical History:  Diagnosis Date  . Active labor 12/04/2014  . Alcohol abuse   . Anemia   . Asthma    as child  . Genital herpes   . SVD (spontaneous vaginal delivery) 12/04/2014    Patient Active Problem List   Diagnosis Date Noted  . Substance induced mood disorder ([place]) 10/24/2017  . Active labor 12/04/2014  . SVD (spontaneous vaginal delivery) 12/04/2014  . VAGINAL DISCHARGE 06/03/2010  . FREQUENCY, URINARY 06/03/2010  . BACK STRAIN, LUMBAR 06/03/2010  . MORBID OBESITY 11/03/2009  . VITAMIN B12 DEFICIENCY 05/14/2009  . FATIGUE 05/06/2009  . DYSURIA 05/06/2009  . PAIN IN JOINT, ANKLE AND FOOT 08/13/2008    Past Surgical History:  Procedure Laterality Date  . TONSILLECTOMY     at 28  years of age     82 History    Gravida  33   Para  2   Term  1   Preterm  1   AB  1   Living  2     SAB  1   TAB      Ectopic  0   Multiple  0   Live Births  2            Home Medications    Prior to Admission medications   Medication Sig Start Date End Date Taking? Authorizing Provider  doxycycline (VIBRA-TABS) 100 MG tablet Take 1 tablet (100 mg total) by mouth 2 (two) times daily. 02/24/19   Rodriguez-Southworth, Sunday Spillers, PA-C  ibuprofen (ADVIL,MOTRIN) 200 MG tablet Take 800 mg by mouth every 6 (six) hours as needed (For headache or pain.).    [provider]  methocarbamol (ROBAXIN) 500 MG tablet Take 1 tablet (500 mg total) by mouth 2 (two) times daily. 09/12/19   Khatri, Hina, PA-C  methylPREDNISolone (MEDROL DOSEPAK) 4 MG TBPK tablet Taper over 6 days. 09/12/19   Delia Heady, PA-C    Family History Family History  Problem Relation Age of Onset  . Hypertension Mother   . Diabetes Mother   . Heart failure Other     Social History Social History  Tobacco Use  . Smoking status: Former Smoker    Packs/day: 0.00    Years: 3.00    Pack years: 0.00    Types: Cigarettes    Quit date: 06/16/2012    Years since quitting: 7.3  . Smokeless tobacco: Never Used  Substance Use Topics  . Alcohol use: Yes    Alcohol/week: 7.0 standard drinks    Types: 7 Standard drinks or equivalent per week  . Drug use: No     Allergies   Patient has no known allergies.   Review of Systems Review of Systems  Constitutional: Negative for fever.  Respiratory: Negative for shortness of breath.   Cardiovascular: Negative for chest pain.  Gastrointestinal: Positive for abdominal pain and nausea. Negative for constipation, diarrhea and vomiting.  Genitourinary: Positive for menstrual problem, pelvic pain and vaginal bleeding. Negative for difficulty urinating, dyspareunia, dysuria, frequency and hematuria.  Musculoskeletal: Positive for back pain.  All other  systems reviewed and are negative.    Physical Exam Updated Vital Signs BP (!) 156/109 (BP Location: Right Arm)   Pulse 99   Temp 98.5 F (36.9 C) (Oral)   Resp 20   LMP 10/03/2019   SpO2 98%   Physical Exam Vitals signs and nursing note reviewed.  Constitutional:      General: She is in acute distress (hyperventilating, standing in the room not able to get comfortable).     Appearance: She is well-developed. She is obese. She is not ill-appearing.  HENT:     Head: Normocephalic and atraumatic.  Eyes:     General: No scleral icterus.       Right eye: No discharge.        Left eye: No discharge.     Conjunctiva/sclera: Conjunctivae normal.     Pupils: Pupils are equal, round, and reactive to light.  Neck:     Musculoskeletal: Normal range of motion.  Cardiovascular:     Rate and Rhythm: Normal rate.  Pulmonary:     Effort: Pulmonary effort is normal. No respiratory distress.  Abdominal:     General: Abdomen is protuberant. Bowel sounds are normal. There is no distension.     Palpations: Abdomen is soft.     Tenderness: There is generalized abdominal tenderness.  Genitourinary:    Comments: No inguinal lymphadenopathy or inguinal hernia noted. Normal external genitalia. There is moderate pain with speculum insertion. Cervical Os was not visualized due to significant patient discomfort. There was moderate vaginal bleeding with tissue appearing material in the vaginal vault. There was no significant discharge. There was diffuse tenderness with bimanual examination. Chaperone present during exam. Skin:    General: Skin is warm and dry.  Neurological:     Mental Status: She is alert and oriented to person, place, and time.  Psychiatric:        Behavior: Behavior normal.      ED Treatments / Results  Labs (all labs ordered are listed, but only abnormal results are displayed) Labs Reviewed  WET PREP, GENITAL - Abnormal; Notable for the following components:      Result  Value   Clue Cells Wet Prep HPF POC PRESENT (*)    WBC, Wet Prep HPF POC MANY (*)    All other components within normal limits  URINALYSIS, ROUTINE W REFLEX MICROSCOPIC - Abnormal; Notable for the following components:   Color, Urine AMBER (*)    APPearance CLOUDY (*)    Hgb urine dipstick LARGE (*)    Protein, ur  100 (*)    Leukocytes,Ua MODERATE (*)    RBC / HPF >50 (*)    WBC, UA >50 (*)    All other components within normal limits  COMPREHENSIVE METABOLIC PANEL - Abnormal; Notable for the following components:   Total Protein 6.0 (*)    Albumin 3.3 (*)    Total Bilirubin 0.1 (*)    All other components within normal limits  URINE CULTURE  CBC WITH DIFFERENTIAL/PLATELET  LIPASE, BLOOD  PREGNANCY, URINE  GC/CHLAMYDIA PROBE AMP ([place]) NOT AT [facility]    EKG None  Radiology US Pelvic Complete W Transvaginal And Torsion R/o  Result Date: 10/04/2019 CLINICAL DATA:  Pelvic pain since 5 a.m. EXAM: TRANSABDOMINAL AND TRANSVAGINAL ULTRASOUND OF PELVIS DOPPLER ULTRASOUND OF OVARIES TECHNIQUE: Both transabdominal and transvaginal ultrasound examinations of the pelvis were performed. Transabdominal technique was performed for global imaging of the pelvis including uterus, ovaries, adnexal regions, and pelvic cul-de-sac. It was necessary to proceed with endovaginal exam following the transabdominal exam to visualize the endometrium. Color and duplex Doppler ultrasound was utilized to evaluate blood flow to the ovaries. COMPARISON:  None. FINDINGS: Uterus Measurements: 13 x 6.4 x 6 cm = volume: 261 mL. 2 x 2.3 x 1.7 cm left anterior uterine hypoechoic mass consistent with a fibroid. 3.6 x 1.8 x 3.2 cm left posterior fundal hypoechoic uterine mass consistent with a fibroid. Endometrium Thickness: 13.2 mm.  No focal abnormality visualized. Right ovary Measurements: 3.1 x 2.1 x 3.2 cm = volume: 10.7 mL. Normal appearance/no adnexal mass. Left ovary Measurements: 3.1 x 1.8 x 2 cm = volume: 5.7  mL. Normal appearance/no adnexal mass. Pulsed Doppler evaluation of both ovaries demonstrates normal low-resistance arterial and venous waveforms. Other findings Trace pelvic free fluid likely physiologic. IMPRESSION: 1. No ovarian torsion. 2. 2 uterine fibroids which appear enlarged compared with the prior exam. Electronically Signed   By: Kathreen Devoid   On: 10/04/2019 13:19    Procedures Procedures (including critical care time)  Medications Ordered in ED Medications  ketorolac (TORADOL) 15 MG/ML injection 15 mg (15 mg Intravenous Given 10/04/19 0926)  ondansetron (ZOFRAN) injection 4 mg (4 mg Intravenous Given 10/04/19 0927)  morphine 4 MG/ML injection 4 mg (4 mg Intravenous Given 10/04/19 0955)     Initial Impression / Assessment and Plan / ED Course  I have reviewed the triage vital signs and the nursing notes.  Pertinent labs & imaging results that were available during my care of the patient were reviewed by me and considered in my medical decision making (see chart for details).  28 year old female presents with pelvic cramping since yesterday along with vaginal bleeding which is consistent with her period.  She is hypertensive but otherwise vital signs are normal.  On exam she has generalized tenderness and is distressed due to pain.  Her pelvic exam is remarkable for vaginal bleeding with diffuse tenderness.  Will obtain labs, UA, pregnancy test, ultrasound.  Will give Toradol and Zofran  On recheck patient is not feeling better.  Will try morphine.  CBC is normal.  CMP is overall reassuring.  UA has moderate leukocytes and >50 white blood cells but is contaminated and has no bacteria and negative nitrites.  She does not describe symptoms consistent with UTI therefore will not treat.  Wet prep shows many white blood cells and present clue cells although patient denies any discharge.  Ultrasound shows 2 fibroids which appear larger than on prior exams.  Is likely the cause of her  symptoms.  Pain is controlled after morphine and Toradol.  She has tolerated p.o.  She has previously seen [facility] OB[facility].  Encourage follow-up and advised return if worsening.  Final Clinical Impressions(s) / ED Diagnoses   Final diagnoses:  Pelvic pain  Fibroids    ED Discharge Orders    None       Recardo Evangelist, PA-C 10/04/19 1338    Charlesetta Shanks, MD 10/05/19 [phone]

## 2019-10-04 NOTE — ED Notes (Signed)
Patient transported to Ultrasound 

## 2019-10-04 NOTE — Discharge Instructions (Signed)
Continue Ibuprofen or Aleve for mild-moderate pain Take Norco for severe pain. Do not drink alcohol or drive while taking this medicine Follow up with OBGYN Return if worsening

## 2019-10-04 NOTE — ED Triage Notes (Signed)
C/o lower abd cramping that radiates to back with nausea.  Also reports pressure when she urinates.  LMP started yesterday.

## 2019-10-05 LAB — URINE CULTURE: Culture: NO GROWTH

## 2019-10-07 ENCOUNTER — Telehealth: Payer: Self-pay | Admitting: Medical

## 2019-10-07 DIAGNOSIS — A749 Chlamydial infection, unspecified: Secondary | ICD-10-CM

## 2019-10-07 LAB — GC/CHLAMYDIA PROBE AMP (~~LOC~~) NOT AT ARMC
Chlamydia: POSITIVE — AB
Neisseria Gonorrhea: NEGATIVE

## 2019-10-07 MED ORDER — AZITHROMYCIN 250 MG PO TABS: 1000.0000 mg | ORAL_TABLET | Freq: Once | ORAL | 0 refills | Status: AC

## 2019-10-07 NOTE — Telephone Encounter (Addendum)
Elizabeth David tested positive for  Chlamydia. Patient was called by RN and allergies and pharmacy confirmed. Rx sent to pharmacy of choice.   Luvenia Redden, PA-C 10/07/2019 2:55 PM      ----- Message from Bjorn Loser, RN sent at 10/07/2019  2:43 PM EDT ----- This patient tested positive for :  chlamydia  She "has NKDA", I have informed the patient of her results and confirmed her pharmacy is correct in her chart. Please send Rx.   Thank you,   Bjorn Loser, RN   Results faxed to [facility] Department.

## 2019-10-07 NOTE — Telephone Encounter (Addendum)
Elizabeth David tested positive for  Chlamydia. Patient was called by RN and allergies and pharmacy confirmed. Rx sent to pharmacy of choice.   Luvenia Redden, PA-C 10/07/2019 3:03 PM      ----- Message from Bjorn Loser, RN sent at 10/07/2019  2:43 PM EDT ----- This patient tested positive for :  chlamydia  She "has NKDA", I have informed the patient of her results and confirmed her pharmacy is correct in her chart. Please send Rx.   Thank you,   Bjorn Loser, RN   Results faxed to [facility] Department.

## 2019-12-31 ENCOUNTER — Encounter (HOSPITAL_COMMUNITY): Payer: Self-pay | Admitting: Emergency Medicine

## 2019-12-31 ENCOUNTER — Emergency Department (HOSPITAL_COMMUNITY)
Admission: EM | Admit: 2019-12-31 | Discharge: 2019-12-31 | Disposition: A | Discharge status: Home / Self Care | Payer: No Typology Code available for payment source | Attending: Emergency Medicine | Admitting: Emergency Medicine

## 2019-12-31 ENCOUNTER — Other Ambulatory Visit: Payer: Self-pay

## 2019-12-31 DIAGNOSIS — Z79899 Other long term (current) drug therapy: Secondary | ICD-10-CM | POA: Insufficient documentation

## 2019-12-31 DIAGNOSIS — Z20822 Contact with and (suspected) exposure to covid-19: Secondary | ICD-10-CM | POA: Insufficient documentation

## 2019-12-31 DIAGNOSIS — J45909 Unspecified asthma, uncomplicated: Secondary | ICD-10-CM | POA: Insufficient documentation

## 2019-12-31 DIAGNOSIS — R197 Diarrhea, unspecified: Secondary | ICD-10-CM | POA: Insufficient documentation

## 2019-12-31 DIAGNOSIS — R519 Headache, unspecified: Secondary | ICD-10-CM | POA: Insufficient documentation

## 2019-12-31 DIAGNOSIS — R42 Dizziness and giddiness: Secondary | ICD-10-CM | POA: Insufficient documentation

## 2019-12-31 DIAGNOSIS — Z87891 Personal history of nicotine dependence: Secondary | ICD-10-CM | POA: Insufficient documentation

## 2019-12-31 LAB — I-STAT BETA HCG BLOOD, ED (MC, WL, AP ONLY): I-stat hCG, quantitative: <5 m[IU]/mL (ref <5)

## 2019-12-31 LAB — COMPREHENSIVE METABOLIC PANEL
ALT: 17 U/L (ref 0–44)
AST: 15 U/L (ref 15–41)
Albumin: 3.9 g/dL (ref 3.5–5.0)
Alkaline Phosphatase: 51 U/L (ref 38–126)
Anion gap: 9 (ref 5–15)
BUN: 18 mg/dL (ref 6–20)
CO2: 27 mmol/L (ref 22–32)
Calcium: 9.1 mg/dL (ref 8.9–10.3)
Chloride: 106 mmol/L (ref 98–111)
Creatinine, Ser: 0.73 mg/dL (ref 0.44–1.00)
GFR calc Af Amer: >60 mL/min (ref >60)
GFR calc non Af Amer: >60 mL/min (ref >60)
Glucose, Bld: 98 mg/dL (ref 70–99)
Potassium: 3.9 mmol/L (ref 3.5–5.1)
Sodium: 142 mmol/L (ref 135–145)
Total Bilirubin: 0.4 mg/dL (ref 0.3–1.2)
Total Protein: 7.1 g/dL (ref 6.5–8.1)

## 2019-12-31 LAB — CBC
HCT: 37 % (ref 36.0–46.0)
Hemoglobin: 11.8 g/dL — ABNORMAL LOW (ref 12.0–15.0)
MCH: 27.8 pg (ref 26.0–34.0)
MCHC: 31.9 g/dL (ref 30.0–36.0)
MCV: 87.1 fL (ref 80.0–100.0)
Platelets: 384 10*3/uL (ref 150–400)
RBC: 4.25 MIL/uL (ref 3.87–5.11)
RDW: 15.2 % (ref 11.5–15.5)
WBC: 8.2 10*3/uL (ref 4.0–10.5)
nRBC: 0 % (ref 0.0–0.2)

## 2019-12-31 LAB — LIPASE, BLOOD: Lipase: 29 U/L (ref 11–51)

## 2019-12-31 MED ORDER — SODIUM CHLORIDE 0.9 % IV BOLUS
1000.0000 mL | Freq: Once | INTRAVENOUS | Status: AC
  Administered 2019-12-31: 1000 mL via INTRAVENOUS

## 2019-12-31 MED ORDER — METOCLOPRAMIDE HCL 5 MG/ML IJ SOLN
10.0000 mg | Freq: Once | INTRAMUSCULAR | Status: AC
  Administered 2019-12-31: 10 mg via INTRAVENOUS
  Filled 2019-12-31: qty 2

## 2019-12-31 MED ORDER — KETOROLAC TROMETHAMINE 15 MG/ML IJ SOLN
30.0000 mg | Freq: Once | INTRAMUSCULAR | Status: AC
  Administered 2019-12-31: 30 mg via INTRAVENOUS
  Filled 2019-12-31: qty 2

## 2019-12-31 MED ORDER — DIPHENHYDRAMINE HCL 50 MG/ML IJ SOLN
12.5000 mg | Freq: Once | INTRAMUSCULAR | Status: AC
  Administered 2019-12-31: 12.5 mg via INTRAVENOUS
  Filled 2019-12-31: qty 1

## 2019-12-31 NOTE — ED Provider Notes (Signed)
[facility] DEPT Provider Note   CSN: [id number] Arrival date & time: 12/31/19  [id number]     History Chief Complaint  Patient presents with  . Headache  . Diarrhea  . Dizziness    Elizabeth David is a 29 y.o. female.  She is here with complaints of frontal headache with spotting going on for 2 weeks.  Not responded to Tylenol and ibuprofen.  Associated with nausea vomiting and diarrhea.  Today she also had some chest pain.  Positive fatigue.  Feeling hot and cold but has not taken her temperature.  No sore throat or cough.  No sick contacts or recent travel.  The history is provided by the patient.  Headache Pain location:  Frontal Quality:  Dull Radiates to:  Does not radiate Severity currently:  7/10 Severity at highest:  7/10 Onset quality:  Gradual Timing:  Intermittent Progression:  Unchanged Chronicity:  New Relieved by:  Nothing Worsened by:  Nothing Ineffective treatments:  Acetaminophen and NSAIDs Associated symptoms: diarrhea, fatigue, fever, nausea and vomiting   Associated symptoms: no abdominal pain, no blurred vision, no cough, no eye pain, no focal weakness, no neck stiffness, no paresthesias, no seizures and no sore throat        Past Medical History:  Diagnosis Date  . Active labor 12/04/2014  . Alcohol abuse   . Anemia   . Asthma    as child  . Genital herpes   . SVD (spontaneous vaginal delivery) 12/04/2014    Patient Active Problem List   Diagnosis Date Noted  . Substance induced mood disorder ([place]) 10/24/2017  . Active labor 12/04/2014  . SVD (spontaneous vaginal delivery) 12/04/2014  . VAGINAL DISCHARGE 06/03/2010  . FREQUENCY, URINARY 06/03/2010  . BACK STRAIN, LUMBAR 06/03/2010  . MORBID OBESITY 11/03/2009  . VITAMIN B12 DEFICIENCY 05/14/2009  . FATIGUE 05/06/2009  . DYSURIA 05/06/2009  . PAIN IN JOINT, ANKLE AND FOOT 08/13/2008    Past Surgical History:  Procedure Laterality Date  . TONSILLECTOMY     at 29 years of age     47 History    Gravida  22   Para  2   Term  1   Preterm  1   AB  1   Living  2     SAB  1   TAB      Ectopic  0   Multiple  0   Live Births  2           Family History  Problem Relation Age of Onset  . Hypertension Mother   . Diabetes Mother   . Heart failure Other     Social History   Tobacco Use  . Smoking status: Former Smoker    Packs/day: 0.00    Years: 3.00    Pack years: 0.00    Types: Cigarettes    Quit date: 06/16/2012    Years since quitting: 7.5  . Smokeless tobacco: Never Used  Substance Use Topics  . Alcohol use: Yes    Alcohol/week: 7.0 standard drinks    Types: 7 Standard drinks or equivalent per week  . Drug use: No    Home Medications Prior to Admission medications   Medication Sig Start Date End Date Taking? Authorizing Provider  doxycycline (VIBRA-TABS) 100 MG tablet Take 1 tablet (100 mg total) by mouth 2 (two) times daily. 02/24/19   Rodriguez-Southworth, Sunday Spillers, PA-C  HYDROcodone-acetaminophen (NORCO/VICODIN) 5-325 MG tablet Take 1 tablet by mouth every 4 (four)  hours as needed. 10/04/19   Recardo Evangelist, PA-C  ibuprofen (ADVIL,MOTRIN) 200 MG tablet Take 800 mg by mouth every 6 (six) hours as needed (For headache or pain.).    [provider]  methocarbamol (ROBAXIN) 500 MG tablet Take 1 tablet (500 mg total) by mouth 2 (two) times daily. 09/12/19   Khatri, Hina, PA-C  methylPREDNISolone (MEDROL DOSEPAK) 4 MG TBPK tablet Taper over 6 days. 09/12/19   Delia Heady, PA-C    Allergies    Patient has no known allergies.  Review of Systems   Review of Systems  Constitutional: Positive for fatigue and fever.  HENT: Negative for sore throat.   Eyes: Negative for blurred vision and pain.  Respiratory: Negative for cough and shortness of breath.   Cardiovascular: Positive for chest pain.  Gastrointestinal: Positive for diarrhea, nausea and vomiting. Negative for abdominal pain.  Genitourinary:  Negative for dysuria.  Musculoskeletal: Negative for neck stiffness.  Skin: Negative for rash.  Neurological: Positive for headaches. Negative for focal weakness, seizures and paresthesias.    Physical Exam Updated Vital Signs BP (!) 151/108   Pulse 88   Temp 99.3 F (37.4 C)   Resp 18   LMP 12/24/2019   SpO2 99%   Physical Exam Vitals and nursing note reviewed.  Constitutional:      General: She is not in acute distress.    Appearance: She is well-developed.  HENT:     Head: Normocephalic and atraumatic.  Eyes:     Extraocular Movements: Extraocular movements intact.     Conjunctiva/sclera: Conjunctivae normal.     Pupils: Pupils are equal, round, and reactive to light.  Neck:     Meningeal: Kernig's sign absent.  Cardiovascular:     Rate and Rhythm: Normal rate and regular rhythm.     Heart sounds: No murmur.  Pulmonary:     Effort: Pulmonary effort is normal. No respiratory distress.     Breath sounds: Normal breath sounds.  Abdominal:     Palpations: Abdomen is soft.     Tenderness: There is no abdominal tenderness.  Musculoskeletal:        General: No deformity or signs of injury. Normal range of motion.     Cervical back: Neck supple.  Skin:    General: Skin is warm and dry.     Capillary Refill: Capillary refill takes less than 2 seconds.  Neurological:     General: No focal deficit present.     Mental Status: She is alert.     GCS: GCS eye subscore is 4. GCS verbal subscore is 5. GCS motor subscore is 6.     Cranial Nerves: No facial asymmetry.     Sensory: No sensory deficit.     Motor: No weakness.     ED Results / Procedures / Treatments   Labs (all labs ordered are listed, but only abnormal results are displayed) Labs Reviewed  CBC - Abnormal; Notable for the following components:      Result Value   Hemoglobin 11.8 (*)    All other components within normal limits  NOVEL CORONAVIRUS, NAA (HOSP ORDER, SEND-OUT TO REF LAB; TAT 18-24 HRS)    LIPASE, BLOOD  COMPREHENSIVE METABOLIC PANEL  URINALYSIS, ROUTINE W REFLEX MICROSCOPIC  I-STAT BETA HCG BLOOD, ED ([facility], [facility], [facility] ONLY)    EKG EKG Interpretation  Date/Time:  Wednesday December 31 2019 08:13:48 EST Ventricular Rate:  84 PR Interval:  138 QRS Duration: 84 QT Interval:  376 QTC  Calculation: 444 R Axis:   72 Text Interpretation: Normal sinus rhythm Normal ECG No significant change since prior today Confirmed by Aletta Edouard [phone]) on 12/31/2019 8:18:13 AM   Radiology No results found.  Procedures Procedures (including critical care time)  Medications Ordered in ED Medications  metoCLOPramide (REGLAN) injection 10 mg (has no administration in time range)  ketorolac (TORADOL) 15 MG/ML injection 30 mg (has no administration in time range)  diphenhydrAMINE (BENADRYL) injection 12.5 mg (has no administration in time range)  sodium chloride 0.9 % bolus 1,000 mL (has no administration in time range)    ED Course  I have reviewed the triage vital signs and the nursing notes.  Pertinent labs & imaging results that were available during my care of the patient were reviewed by me and considered in my medical decision making (see chart for details).  Clinical Course as of Dec 30 1450  Wed Dec 31, 2019  1232 Patient here with headache and diarrhea.  Differential includes viral syndrome, Covid, dehydration, metabolic derangement.  Nonfocal neuro exam otherwise nontoxic-appearing.  Labs unremarkable.  Covid test pending at time of discharge.  I reviewed this with the patient and she is comfortable with plan.  Headache resolved after Reglan and Toradol IV fluids.   [MB]    Clinical Course User Index [MB] Hayden Rasmussen, MD   MDM Rules/Calculators/A&P                     LAREEN SELDOMRIDGE was evaluated in Emergency Department on 12/31/2019 for the symptoms described in the history of present illness. She was evaluated in the context of the global COVID-19 pandemic, which  necessitated consideration that the patient might be at risk for infection with the SARS-CoV-2 virus that causes COVID-19. Institutional protocols and algorithms that pertain to the evaluation of patients at risk for COVID-19 are in a state of rapid change based on information released by regulatory bodies including the CDC and federal and state organizations. These policies and algorithms were followed during the patient's care in the ED.   Final Clinical Impression(s) / ED Diagnoses Final diagnoses:  Generalized headache  Diarrhea, unspecified type  Person under investigation for COVID-19    Rx / DC Orders ED Discharge Orders    None       Hayden Rasmussen, MD 12/31/19 1453

## 2019-12-31 NOTE — ED Triage Notes (Signed)
For headache took 3 extra strength tylenol around 3 am. 11pm last night took 4 ibuprofen. Reports been getting headaches twice a day for past 2 weeks.  also having n/v/ diarrhea x3 this morning, chest pains, fatigue.

## 2019-12-31 NOTE — Discharge Instructions (Addendum)
You were seen in the emergency department for evaluation of headache diarrhea and body aches.  Your lab work was unremarkable and your Covid test was pending at time of discharge.  This test should result in the next day or 2.  You should isolate until the results of your test are back.  If you are Covid positive you will need to isolate for about 10 days.  Please follow-up with your doctor and return if any worsening symptoms.

## 2020-01-01 LAB — NOVEL CORONAVIRUS, NAA (HOSP ORDER, SEND-OUT TO REF LAB; TAT 18-24 HRS): SARS-CoV-2, NAA: NOT DETECTED

## 2020-02-02 ENCOUNTER — Emergency Department (HOSPITAL_COMMUNITY)
Admission: EM | Admit: 2020-02-02 | Discharge: 2020-02-02 | Disposition: A | Discharge status: Home / Self Care | Payer: Self-pay | Attending: Emergency Medicine | Admitting: Emergency Medicine

## 2020-02-02 ENCOUNTER — Other Ambulatory Visit: Payer: Self-pay

## 2020-02-02 ENCOUNTER — Encounter (HOSPITAL_COMMUNITY): Payer: Self-pay | Admitting: Emergency Medicine

## 2020-02-02 DIAGNOSIS — F1210 Cannabis abuse, uncomplicated: Secondary | ICD-10-CM | POA: Insufficient documentation

## 2020-02-02 DIAGNOSIS — R197 Diarrhea, unspecified: Secondary | ICD-10-CM | POA: Insufficient documentation

## 2020-02-02 DIAGNOSIS — Z87891 Personal history of nicotine dependence: Secondary | ICD-10-CM | POA: Insufficient documentation

## 2020-02-02 DIAGNOSIS — R109 Unspecified abdominal pain: Secondary | ICD-10-CM

## 2020-02-02 DIAGNOSIS — R110 Nausea: Secondary | ICD-10-CM | POA: Insufficient documentation

## 2020-02-02 LAB — CBC WITH DIFFERENTIAL/PLATELET
Abs Immature Granulocytes: 0.04 10*3/uL (ref 0.00–0.07)
Basophils Absolute: 0 10*3/uL (ref 0.0–0.1)
Basophils Relative: 0 %
Eosinophils Absolute: 0 10*3/uL (ref 0.0–0.5)
Eosinophils Relative: 0 %
HCT: 37.6 % (ref 36.0–46.0)
Hemoglobin: 12 g/dL (ref 12.0–15.0)
Immature Granulocytes: 0 %
Lymphocytes Relative: 23 %
Lymphs Abs: 2.1 10*3/uL (ref 0.7–4.0)
MCH: 27.3 pg (ref 26.0–34.0)
MCHC: 31.9 g/dL (ref 30.0–36.0)
MCV: 85.6 fL (ref 80.0–100.0)
Monocytes Absolute: 0.6 10*3/uL (ref 0.1–1.0)
Monocytes Relative: 6 %
Neutro Abs: 6.3 10*3/uL (ref 1.7–7.7)
Neutrophils Relative %: 71 %
Platelets: 338 10*3/uL (ref 150–400)
RBC: 4.39 MIL/uL (ref 3.87–5.11)
RDW: 14.6 % (ref 11.5–15.5)
WBC: 9 10*3/uL (ref 4.0–10.5)
nRBC: 0 % (ref 0.0–0.2)

## 2020-02-02 LAB — COMPREHENSIVE METABOLIC PANEL
ALT: 13 U/L (ref 0–44)
AST: 17 U/L (ref 15–41)
Albumin: 3.6 g/dL (ref 3.5–5.0)
Alkaline Phosphatase: 56 U/L (ref 38–126)
Anion gap: 12 (ref 5–15)
BUN: 11 mg/dL (ref 6–20)
CO2: 22 mmol/L (ref 22–32)
Calcium: 8.6 mg/dL — ABNORMAL LOW (ref 8.9–10.3)
Chloride: 102 mmol/L (ref 98–111)
Creatinine, Ser: 0.75 mg/dL (ref 0.44–1.00)
GFR calc Af Amer: >60 mL/min (ref >60)
GFR calc non Af Amer: >60 mL/min (ref >60)
Glucose, Bld: 106 mg/dL — ABNORMAL HIGH (ref 70–99)
Potassium: 3.9 mmol/L (ref 3.5–5.1)
Sodium: 136 mmol/L (ref 135–145)
Total Bilirubin: 0.4 mg/dL (ref 0.3–1.2)
Total Protein: 6.5 g/dL (ref 6.5–8.1)

## 2020-02-02 LAB — LIPASE, BLOOD: Lipase: 27 U/L (ref 11–51)

## 2020-02-02 LAB — I-STAT BETA HCG BLOOD, ED (MC, WL, AP ONLY): I-stat hCG, quantitative: <5 m[IU]/mL (ref <5)

## 2020-02-02 MED ORDER — DICYCLOMINE HCL 20 MG PO TABS: 20.0000 mg | ORAL_TABLET | Freq: Two times a day (BID) | ORAL | 0 refills | Status: DC | PRN

## 2020-02-02 MED ORDER — ONDANSETRON 4 MG PO TBDP: 4.0000 mg | ORAL_TABLET | Freq: Three times a day (TID) | ORAL | 0 refills | Status: DC | PRN

## 2020-02-02 MED ORDER — LACTATED RINGERS IV BOLUS
1000.0000 mL | Freq: Once | INTRAVENOUS | Status: AC
  Administered 2020-02-02: 1000 mL via INTRAVENOUS

## 2020-02-02 MED ORDER — DICYCLOMINE HCL 10 MG PO CAPS
10.0000 mg | ORAL_CAPSULE | Freq: Once | ORAL | Status: AC
  Administered 2020-02-02: 10 mg via ORAL
  Filled 2020-02-02: qty 1

## 2020-02-02 NOTE — ED Notes (Signed)
Patient verbalizes understanding of discharge instructions . Opportunity for questions and answers were provided . Armband removed by staff ,Pt discharged from ED. W/C  offered at D/C  and Declined W/C at D/C and was escorted to lobby by RN.  

## 2020-02-02 NOTE — ED Triage Notes (Signed)
Pt reports persistent diarrhea since 1 am. C/o abdominal pain and cramping. Tried tylenol, pepto bismol with little releif. Denies recent fevers, sick contacts.

## 2020-02-02 NOTE — ED Provider Notes (Signed)
[facility] EMERGENCY DEPARTMENT Provider Note   CSN: [id number] Arrival date & time: 02/02/20  [id number]     History Chief Complaint  Patient presents with  . Diarrhea    Elizabeth David is a 29 y.o. female.  The history is provided by the patient and medical records. No language interpreter was used.  Diarrhea  Elizabeth David is a 29 y.o. female who presents to the Emergency Department complaining of abdominal pain and diarrhea. She presents the emergency department complaining of abdominal pain and diarrhea that began this morning. She complains of generalized abdominal cramping with numerous episodes of watery bowel movement. She did have diarrhea a few days ago, which resolved only to recur again this morning. She denies any fevers. She does have nausea. No dysuria. She states that she has had food indiscretions recently and has had more fatty and greasy food recently than she should have. She also has been drinking alcohol for the last several days. She has no medical problems and takes no prescription medications. No recent antibiotic use. No prior abdominal surgeries.    Past Medical History:  Diagnosis Date  . Active labor 12/04/2014  . Alcohol abuse   . Anemia   . Asthma    as child  . Genital herpes   . SVD (spontaneous vaginal delivery) 12/04/2014    Patient Active Problem List   Diagnosis Date Noted  . Substance induced mood disorder ([place]) 10/24/2017  . Active labor 12/04/2014  . SVD (spontaneous vaginal delivery) 12/04/2014  . VAGINAL DISCHARGE 06/03/2010  . FREQUENCY, URINARY 06/03/2010  . BACK STRAIN, LUMBAR 06/03/2010  . MORBID OBESITY 11/03/2009  . VITAMIN B12 DEFICIENCY 05/14/2009  . FATIGUE 05/06/2009  . DYSURIA 05/06/2009  . PAIN IN JOINT, ANKLE AND FOOT 08/13/2008    Past Surgical History:  Procedure Laterality Date  . TONSILLECTOMY     at 29 years of age     38 History    Gravida  17   Para  2   Term  1   Preterm  1   AB   1   Living  2     SAB  1   TAB      Ectopic  0   Multiple  0   Live Births  2           Family History  Problem Relation Age of Onset  . Hypertension Mother   . Diabetes Mother   . Heart failure Other     Social History   Tobacco Use  . Smoking status: Former Smoker    Packs/day: 0.00    Years: 3.00    Pack years: 0.00    Types: Cigarettes    Quit date: 06/16/2012    Years since quitting: 7.6  . Smokeless tobacco: Never Used  Substance Use Topics  . Alcohol use: Yes    Alcohol/week: 7.0 standard drinks    Types: 7 Standard drinks or equivalent per week  . Drug use: Yes    Types: Marijuana    Home Medications Prior to Admission medications   Medication Sig Start Date End Date Taking? Authorizing Provider  acetaminophen (TYLENOL) 500 MG tablet Take 1,500 mg by mouth every 6 (six) hours as needed for mild pain or headache.    [provider]  dicyclomine (BENTYL) 20 MG tablet Take 1 tablet (20 mg total) by mouth 2 (two) times daily as needed for spasms. 02/02/20   Quintella Reichert, MD  doxycycline (  VIBRA-TABS) 100 MG tablet Take 1 tablet (100 mg total) by mouth 2 (two) times daily. Patient not taking: Reported on 12/31/2019 02/24/19   Rodriguez-Southworth, Sunday Spillers, PA-C  HYDROcodone-acetaminophen (NORCO/VICODIN) 5-325 MG tablet Take 1 tablet by mouth every 4 (four) hours as needed. Patient not taking: Reported on 12/31/2019 10/04/19   Recardo Evangelist, PA-C  ibuprofen (ADVIL,MOTRIN) 200 MG tablet Take 800 mg by mouth every 6 (six) hours as needed (For headache or pain.).    [provider]  methocarbamol (ROBAXIN) 500 MG tablet Take 1 tablet (500 mg total) by mouth 2 (two) times daily. Patient not taking: Reported on 12/31/2019 09/12/19   Delia Heady, PA-C  methylPREDNISolone (MEDROL DOSEPAK) 4 MG TBPK tablet Taper over 6 days. Patient not taking: Reported on 12/31/2019 09/12/19   Delia Heady, PA-C  ondansetron (ZOFRAN ODT) 4 MG disintegrating  tablet Take 1 tablet (4 mg total) by mouth every 8 (eight) hours as needed for nausea or vomiting. 02/02/20   Quintella Reichert, MD    Allergies    Patient has no known allergies.  Review of Systems   Review of Systems  Gastrointestinal: Positive for diarrhea.  All other systems reviewed and are negative.   Physical Exam Updated Vital Signs BP (!) 139/100 (BP Location: Right Arm)   Pulse 98   Temp 98.2 F (36.8 C) (Oral)   Resp 20   Ht 5\' 6"  (1.676 m)   Wt 111.1 kg   LMP 01/20/2020   SpO2 96%   BMI 39.54 kg/m   Physical Exam Vitals and nursing note reviewed.  Constitutional:      Appearance: She is well-developed.  HENT:     Head: Normocephalic and atraumatic.  Cardiovascular:     Rate and Rhythm: Normal rate and regular rhythm.  Pulmonary:     Effort: Pulmonary effort is normal. No respiratory distress.  Abdominal:     Palpations: Abdomen is soft.     Tenderness: There is no guarding or rebound.     Comments: Mild generalized abdominal tenderness  Musculoskeletal:        General: No tenderness.  Skin:    General: Skin is warm and dry.  Neurological:     Mental Status: She is alert and oriented to person, place, and time.  Psychiatric:        Behavior: Behavior normal.     ED Results / Procedures / Treatments   Labs (all labs ordered are listed, but only abnormal results are displayed) Labs Reviewed  COMPREHENSIVE METABOLIC PANEL - Abnormal; Notable for the following components:      Result Value   Glucose, Bld 106 (*)    Calcium 8.6 (*)    All other components within normal limits  CBC WITH DIFFERENTIAL/PLATELET  LIPASE, BLOOD  I-STAT BETA HCG BLOOD, ED ([facility], [facility], [facility] ONLY)    EKG None  Radiology No results found.  Procedures Procedures (including critical care time)  Medications Ordered in ED Medications  lactated ringers bolus 1,000 mL (1,000 mLs Intravenous New Bag/Given 02/02/20 0918)  dicyclomine (BENTYL) capsule 10 mg (10 mg Oral Given  02/02/20 0908)    ED Course  I have reviewed the triage vital signs and the nursing notes.  Pertinent labs & imaging results that were available during my care of the patient were reviewed by me and considered in my medical decision making (see chart for details).    MDM Rules/Calculators/A&P  Patient here for evaluation of diarrhea and abdominal cramping. She does have mild tenderness on examination without peritoneal findings. Presentation is not consistent with diverticulitis, cholecystitis, appendicitis. She is feeling partially improved after IV fluids, bentyl. Discussed with patient home care for diarrhea. Discussed outpatient follow-up and return precautions.  Final Clinical Impression(s) / ED Diagnoses Final diagnoses:  Diarrhea, unspecified type  Abdominal cramping    Rx / DC Orders ED Discharge Orders         Ordered    dicyclomine (BENTYL) 20 MG tablet  2 times daily PRN     02/02/20 1000    ondansetron (ZOFRAN ODT) 4 MG disintegrating tablet  Every 8 hours PRN     02/02/20 1000           Quintella Reichert, MD 02/02/20 1002

## 2020-03-29 ENCOUNTER — Ambulatory Visit (HOSPITAL_COMMUNITY)
Admission: EM | Admit: 2020-03-29 | Discharge: 2020-03-29 | Disposition: A | Discharge status: Home / Self Care | Payer: Self-pay | Attending: Internal Medicine | Admitting: Internal Medicine

## 2020-03-29 ENCOUNTER — Other Ambulatory Visit: Payer: Self-pay

## 2020-03-29 DIAGNOSIS — M5432 Sciatica, left side: Secondary | ICD-10-CM

## 2020-03-29 MED ORDER — PREDNISONE 20 MG PO TABS: 20.0000 mg | ORAL_TABLET | Freq: Every day | ORAL | 0 refills | Status: AC

## 2020-03-29 MED ORDER — ACETAMINOPHEN 500 MG PO TABS: 500.0000 mg | ORAL_TABLET | Freq: Four times a day (QID) | ORAL | 0 refills | Status: DC | PRN

## 2020-03-29 MED ORDER — DEXAMETHASONE SODIUM PHOSPHATE 10 MG/ML IJ SOLN
10.0000 mg | Freq: Once | INTRAMUSCULAR | Status: AC
  Administered 2020-03-29: 10 mg via INTRAMUSCULAR

## 2020-03-29 MED ORDER — IBUPROFEN 600 MG PO TABS: 600.0000 mg | ORAL_TABLET | Freq: Four times a day (QID) | ORAL | 0 refills | Status: DC | PRN

## 2020-03-29 MED ORDER — DEXAMETHASONE SODIUM PHOSPHATE 10 MG/ML IJ SOLN
INTRAMUSCULAR | Status: AC
  Filled 2020-03-29: qty 1

## 2020-03-29 MED ORDER — KETOROLAC TROMETHAMINE 30 MG/ML IJ SOLN
INTRAMUSCULAR | Status: AC
  Filled 2020-03-29: qty 1

## 2020-03-29 MED ORDER — KETOROLAC TROMETHAMINE 30 MG/ML IJ SOLN
30.0000 mg | Freq: Once | INTRAMUSCULAR | Status: AC
  Administered 2020-03-29: 12:00:00 30 mg via INTRAMUSCULAR

## 2020-03-29 NOTE — ED Triage Notes (Signed)
Aleve, Tylenol and ibuprofen not helping

## 2020-03-29 NOTE — ED Triage Notes (Signed)
Pt c/o low back pain radiating down L leg x 3 days, denies injury

## 2020-03-29 NOTE — ED Provider Notes (Signed)
[place]    CSN: [id number] Arrival date & time: 03/29/20  1034      History   Chief Complaint Chief Complaint  Patient presents with  . Back Pain  . Leg Pain    HPI Elizabeth David is a 29 y.o. female comes to urgent care with complaint of left-sided back pain.  Pain is sharp, severe, currently 10 out of 10, worse with sitting in the same position for a long time, no relief with Tylenol/Motrin and pain radiating to the left leg.  Patient denies any weakness in the left leg.  He admits having some numbness in the left leg.  No trauma or falls.  Patient works from home.  She sits in her chair for about 10 hours and has been doing so for several weeks.   HPI  Past Medical History:  Diagnosis Date  . Active labor 12/04/2014  . Alcohol abuse   . Anemia   . Asthma    as child  . Genital herpes   . SVD (spontaneous vaginal delivery) 12/04/2014    Patient Active Problem List   Diagnosis Date Noted  . Substance induced mood disorder ([place]) 10/24/2017  . Active labor 12/04/2014  . SVD (spontaneous vaginal delivery) 12/04/2014  . VAGINAL DISCHARGE 06/03/2010  . FREQUENCY, URINARY 06/03/2010  . BACK STRAIN, LUMBAR 06/03/2010  . MORBID OBESITY 11/03/2009  . VITAMIN B12 DEFICIENCY 05/14/2009  . FATIGUE 05/06/2009  . DYSURIA 05/06/2009  . PAIN IN JOINT, ANKLE AND FOOT 08/13/2008    Past Surgical History:  Procedure Laterality Date  . TONSILLECTOMY     at 29 years of age    32 History    Gravida  40   Para  2   Term  1   Preterm  1   AB  1   Living  2     SAB  1   TAB      Ectopic  0   Multiple  0   Live Births  2            Home Medications    Prior to Admission medications   Medication Sig Start Date End Date Taking? Authorizing Provider  acetaminophen (TYLENOL) 500 MG tablet Take 1 tablet (500 mg total) by mouth every 6 (six) hours as needed. 03/29/20   Demetric Dunnaway, Myrene Galas, MD  dicyclomine (BENTYL) 20 MG tablet Take 1 tablet (20 mg  total) by mouth 2 (two) times daily as needed for spasms. 02/02/20   Quintella Reichert, MD  ibuprofen (ADVIL) 600 MG tablet Take 1 tablet (600 mg total) by mouth every 6 (six) hours as needed. 03/29/20   Chase Picket, MD  ondansetron (ZOFRAN ODT) 4 MG disintegrating tablet Take 1 tablet (4 mg total) by mouth every 8 (eight) hours as needed for nausea or vomiting. 02/02/20   Quintella Reichert, MD  predniSONE (DELTASONE) 20 MG tablet Take 1 tablet (20 mg total) by mouth daily for 5 days. 03/29/20 04/03/20  Chase Picket, MD    Family History Family History  Problem Relation Age of Onset  . Hypertension Mother   . Diabetes Mother   . Heart failure Other     Social History Social History   Tobacco Use  . Smoking status: Former Smoker    Packs/day: 0.00    Years: 3.00    Pack years: 0.00    Types: Cigarettes    Quit date: 06/16/2012    Years since quitting: 7.7  .  Smokeless tobacco: Never Used  Substance Use Topics  . Alcohol use: Yes    Alcohol/week: 7.0 standard drinks    Types: 7 Standard drinks or equivalent per week  . Drug use: Yes    Types: Marijuana     Allergies   Patient has no known allergies.   Review of Systems Review of Systems  Constitutional: Positive for activity change. Negative for fatigue.  Respiratory: Negative.   Musculoskeletal: Positive for back pain. Negative for arthralgias, joint swelling, myalgias and neck pain.  Skin: Negative.   Neurological: Positive for numbness. Negative for dizziness, tremors, syncope, weakness, light-headedness and headaches.  Psychiatric/Behavioral: Negative for confusion and decreased concentration.     Physical Exam Triage Vital Signs ED Triage Vitals [03/29/20 1105]  Enc Vitals Group     BP (!) 147/96     Pulse Rate 95     Resp 16     Temp 98.3 F (36.8 C)     Temp src      SpO2 99 %     Weight      Height      Head Circumference      Peak Flow      Pain Score 9     Pain Loc      Pain Edu?      Excl.  in [place]?    No data found.  Updated Vital Signs BP (!) 147/96   Pulse 95   Temp 98.3 F (36.8 C)   Resp 16   LMP 03/15/2020   SpO2 99%   Visual Acuity Right Eye Distance:   Left Eye Distance:   Bilateral Distance:    Right Eye Near:   Left Eye Near:    Bilateral Near:     Physical Exam Vitals and nursing note reviewed.  Constitutional:      General: She is not in acute distress.    Appearance: Normal appearance. She is not ill-appearing.  Cardiovascular:     Rate and Rhythm: Normal rate and regular rhythm.     Pulses: Normal pulses.     Heart sounds: Normal heart sounds.  Pulmonary:     Effort: Pulmonary effort is normal. No respiratory distress.     Breath sounds: Normal breath sounds. No rhonchi or rales.  Abdominal:     General: Abdomen is flat. Bowel sounds are normal.  Musculoskeletal:        General: Tenderness present. Normal range of motion.     Comments: Tenderness to palpation in the left sacroiliac joint as well as the lumbosacral spine.  No flank tenderness.  Paraspinal muscle tenderness.  Skin:    Capillary Refill: Capillary refill takes less than 2 seconds.  Neurological:     General: No focal deficit present.     Mental Status: She is alert.      UC Treatments / Results  Labs (all labs ordered are listed, but only abnormal results are displayed) Labs Reviewed - No data to display  EKG   Radiology No results found.  Procedures Procedures (including critical care time)  Medications Ordered in UC Medications  ketorolac (TORADOL) 30 MG/ML injection 30 mg (30 mg Intramuscular Given 03/29/20 1213)  dexamethasone (DECADRON) injection 10 mg (10 mg Intramuscular Given 03/29/20 1213)    Initial Impression / Assessment and Plan / UC Course  I have reviewed the triage vital signs and the nursing notes.  Pertinent labs & imaging results that were available during my care of the patient were reviewed by  me and considered in my medical decision  making (see chart for details).     1.  Sciatica: Dexamethasone 10 mg IM x1 dose Toradol 30 mg IM x1 dose Short course of prednisone 20 mg orally daily for 5 days Ibuprofen 600 mg every 6 hours as needed for pain Gentle range of motion exercises Return precautions given  Final Clinical Impressions(s) / UC Diagnoses   Final diagnoses:  Sciatica of left side   Discharge Instructions   None    ED Prescriptions    Medication Sig Dispense Auth. Provider   predniSONE (DELTASONE) 20 MG tablet Take 1 tablet (20 mg total) by mouth daily for 5 days. 5 tablet Leondre Taul, Myrene Galas, MD   ibuprofen (ADVIL) 600 MG tablet Take 1 tablet (600 mg total) by mouth every 6 (six) hours as needed. 30 tablet Xaine Sansom, Myrene Galas, MD   acetaminophen (TYLENOL) 500 MG tablet Take 1 tablet (500 mg total) by mouth every 6 (six) hours as needed. 30 tablet Loris Winrow, Myrene Galas, MD     PDMP not reviewed this encounter.   Chase Picket, MD 03/29/20 1249

## 2020-04-12 ENCOUNTER — Emergency Department (HOSPITAL_COMMUNITY)
Admission: EM | Admit: 2020-04-12 | Discharge: 2020-04-12 | Disposition: A | Discharge status: Home / Self Care | Payer: Self-pay | Attending: Emergency Medicine | Admitting: Emergency Medicine

## 2020-04-12 ENCOUNTER — Emergency Department (HOSPITAL_COMMUNITY): Payer: Self-pay

## 2020-04-12 ENCOUNTER — Other Ambulatory Visit: Payer: Self-pay

## 2020-04-12 DIAGNOSIS — Z87891 Personal history of nicotine dependence: Secondary | ICD-10-CM | POA: Insufficient documentation

## 2020-04-12 DIAGNOSIS — Z79899 Other long term (current) drug therapy: Secondary | ICD-10-CM | POA: Insufficient documentation

## 2020-04-12 DIAGNOSIS — M5442 Lumbago with sciatica, left side: Secondary | ICD-10-CM | POA: Insufficient documentation

## 2020-04-12 MED ORDER — METHOCARBAMOL 750 MG PO TABS: 750.0000 mg | ORAL_TABLET | Freq: Every evening | ORAL | 0 refills | Status: AC | PRN

## 2020-04-12 MED ORDER — PREDNISONE 10 MG (21) PO TBPK: ORAL_TABLET | Freq: Every day | ORAL | 0 refills | Status: DC

## 2020-04-12 NOTE — ED Triage Notes (Signed)
Pt here from home with c/o left foot numbness and low back pain pt just finished up prednisone that she got from UC , once she stopped the meds the pain came back

## 2020-04-12 NOTE — ED Provider Notes (Signed)
[place] EMERGENCY DEPARTMENT Provider Note   CSN: [id number] Arrival date & time: 04/12/20  0818     History Chief Complaint  Patient presents with  . Back Pain    Elizabeth David is a 29 y.o. female.  HPI   29 year old female with a history of alcohol abuse, anemia, asthma, genital herpes, who presents to the ED today for evaluation of left lower back pain that radiates to the left lower extremity.  Symptoms have been present for greater than 1 week.  She also reports some numbness/paresthesias to the left great toe.  States she was seen at urgent care for her symptoms and diagnosed with sciatica.  She was given prednisone and ibuprofen which she states she took and it improved her symptoms temporarily however after stopping the medication her symptoms returned.  She is concerned about the numbness in her toe and foot.  She denies any urinary retention or loss of control of her bowels.  She states she had one episode of urinary incontinence that occurred when she sneezed.  She is had no further episodes and has no saddle anesthesia.   Past Medical History:  Diagnosis Date  . Active labor 12/04/2014  . Alcohol abuse   . Anemia   . Asthma    as child  . Genital herpes   . SVD (spontaneous vaginal delivery) 12/04/2014    Patient Active Problem List   Diagnosis Date Noted  . Substance induced mood disorder ([place]) 10/24/2017  . Active labor 12/04/2014  . SVD (spontaneous vaginal delivery) 12/04/2014  . VAGINAL DISCHARGE 06/03/2010  . FREQUENCY, URINARY 06/03/2010  . BACK STRAIN, LUMBAR 06/03/2010  . MORBID OBESITY 11/03/2009  . VITAMIN B12 DEFICIENCY 05/14/2009  . FATIGUE 05/06/2009  . DYSURIA 05/06/2009  . PAIN IN JOINT, ANKLE AND FOOT 08/13/2008    Past Surgical History:  Procedure Laterality Date  . TONSILLECTOMY     at 29 years of age     42 History    Gravida  42   Para  2   Term  1   Preterm  1   AB  1   Living  2     SAB  1   TAB      Ectopic  0   Multiple  0   Live Births  2           Family History  Problem Relation Age of Onset  . Hypertension Mother   . Diabetes Mother   . Heart failure Other     Social History   Tobacco Use  . Smoking status: Former Smoker    Packs/day: 0.00    Years: 3.00    Pack years: 0.00    Types: Cigarettes    Quit date: 06/16/2012    Years since quitting: 7.8  . Smokeless tobacco: Never Used  Substance Use Topics  . Alcohol use: Yes    Alcohol/week: 7.0 standard drinks    Types: 7 Standard drinks or equivalent per week  . Drug use: Yes    Types: Marijuana    Home Medications Prior to Admission medications   Medication Sig Start Date End Date Taking? Authorizing Provider  acetaminophen (TYLENOL) 500 MG tablet Take 1 tablet (500 mg total) by mouth every 6 (six) hours as needed. 03/29/20   Lamptey, Myrene Galas, MD  dicyclomine (BENTYL) 20 MG tablet Take 1 tablet (20 mg total) by mouth 2 (two) times daily as needed for spasms. 02/02/20   Ralene Bathe,  Benjamine Mola, MD  ibuprofen (ADVIL) 600 MG tablet Take 1 tablet (600 mg total) by mouth every 6 (six) hours as needed. 03/29/20   Lamptey, Myrene Galas, MD  methocarbamol (ROBAXIN) 750 MG tablet Take 1 tablet (750 mg total) by mouth at bedtime as needed for up to 5 days for muscle spasms. 04/12/20 04/17/20  Koda Routon S, PA-C  ondansetron (ZOFRAN ODT) 4 MG disintegrating tablet Take 1 tablet (4 mg total) by mouth every 8 (eight) hours as needed for nausea or vomiting. 02/02/20   Quintella Reichert, MD  predniSONE (STERAPRED UNI-PAK 21 TAB) 10 MG (21) TBPK tablet Take by mouth daily. Take 6 tabs by mouth daily  for 2 days, then 5 tabs for 2 days, then 4 tabs for 2 days, then 3 tabs for 2 days, 2 tabs for 2 days, then 1 tab by mouth daily for 2 days 04/12/20   Gwendloyn Forsee S, PA-C    Allergies    Patient has no known allergies.  Review of Systems   Review of Systems  Gastrointestinal:       No loss of control of bowel function    Genitourinary: Negative for dysuria and flank pain.       No persistent loss of control of bladder function  Musculoskeletal: Positive for back pain.       Leg pain  Neurological: Positive for numbness. Negative for weakness.    Physical Exam Updated Vital Signs BP 117/61   Pulse 72   Temp 98.4 F (36.9 C) (Oral)   Resp 16   LMP 03/15/2020   SpO2 100%   Physical Exam Vitals and nursing note reviewed.  Constitutional:      General: She is not in acute distress.    Appearance: She is well-developed.  HENT:     Head: Normocephalic and atraumatic.  Eyes:     Conjunctiva/sclera: Conjunctivae normal.  Cardiovascular:     Rate and Rhythm: Normal rate.  Pulmonary:     Effort: Pulmonary effort is normal.  Musculoskeletal:        General: Normal range of motion.     Cervical back: Neck supple.       Back:     Comments: TTP to the areas indicated above  Skin:    General: Skin is warm and dry.  Neurological:     Mental Status: She is alert.     Comments: Mental Status:  Alert, thought content appropriate, able to give a coherent history. Speech fluent without evidence of aphasia. Able to follow 2 step commands without difficulty.  Cranial Nerves:  II:  Peripheral visual fields grossly normal, pupils equal, round, reactive to light III,IV, VI: ptosis not present, extra-ocular motions intact bilaterally  V,VII: smile symmetric, facial light touch sensation equal VIII: hearing grossly normal to voice  X: uvula elevates symmetrically  XI: bilateral shoulder shrug symmetric and strong XII: midline tongue extension without fassiculations Motor:  Normal tone. 5/5 strength of BUE and BLE major muscle groups including strong and equal grip strength and dorsiflexion/plantar flexion Sensory: light touch normal in all extremities with exception of decreased sensation to the left great toe, otherwise reassuring     ED Results / Procedures / Treatments   Labs (all labs ordered are  listed, but only abnormal results are displayed) Labs Reviewed - No data to display  EKG None  Radiology DG Lumbar Spine Complete  Result Date: 04/12/2020 CLINICAL DATA:  Pain in left foot numbness. EXAM: LUMBAR SPINE - COMPLETE 4+ VIEW  COMPARISON:  06/26/2011 FINDINGS: Normal alignment of the lumbar spine. The vertebral body heights and disc spaces are well preserved. There is no fracture or subluxation identified. IMPRESSION: Negative. Electronically Signed   By: Kerby Moors M.D.   On: 04/12/2020 09:12    Procedures Procedures (including critical care time)  Medications Ordered in ED Medications - No data to display  ED Course  I have reviewed the triage vital signs and the nursing notes.  Pertinent labs & imaging results that were available during my care of the patient were reviewed by me and considered in my medical decision making (see chart for details).    MDM Rules/Calculators/A&P                      Pt with sxs consistent with sciatica, neuro exam is overall reassuring other than some decreased sensation to the left great toe, no evidence of urinary persistent incontinence or retention, pain is consistently reproducible. There is no evidence of AAA or concern for dissection at this time.   Patient can walk but states is painful.  No loss of bowel or bladder control.  No concern for cauda equina.  No fever, night sweats, weight loss, h/o cancer, IVDU.  Pain treated here in the department with adequate improvement. RICE protocol and pain medicine indicated and discussed with patient. I have also discussed reasons to return immediately to the ER.  Patient expresses understanding and agrees with plan.  Final Clinical Impression(s) / ED Diagnoses Final diagnoses:  Acute left-sided low back pain with left-sided sciatica    Rx / DC Orders ED Discharge Orders         Ordered    predniSONE (STERAPRED UNI-PAK 21 TAB) 10 MG (21) TBPK tablet  Daily     04/12/20 1055     methocarbamol (ROBAXIN) 750 MG tablet  At bedtime PRN     04/12/20 1055           Melissaann Dizdarevic S, PA-C 04/12/20 [place], [place], DO 04/12/20 1911

## 2020-04-12 NOTE — ED Notes (Signed)
Pt d/c home per MD order. Discharge summary reviewed with pt, pt verbalizes understanding. Ambulatory off unit. No s/s of acute distress noted at discharge.  °

## 2020-04-12 NOTE — Discharge Instructions (Signed)
You may alternate taking Tylenol and Ibuprofen as needed for pain control. You may take 400-600 mg of ibuprofen every 6 hours and [phone] mg of Tylenol every 6 hours. Do not exceed 4000 mg of Tylenol daily as this can lead to liver damage. Also, make sure to take Ibuprofen with meals as it can cause an upset stomach. Do not take other NSAIDs while taking Ibuprofen such as (Aleve, Naprosyn, Aspirin, Celebrex, etc) and do not take more than the prescribed dose as this can lead to ulcers and bleeding in your GI tract. You may use warm and cold compresses to help with your symptoms.   Take prednisone as directed.   You were given a prescription for Robaxin which is a muscle relaxer.  You should not drive, work, or operate machinery while taking this medication as it can make you very drowsy.    Please follow up with your primary doctor within the next 7-10 days for re-evaluation and further treatment of your symptoms.   Please return to the ER sooner if you have any new or worsening symptoms.

## 2020-04-19 ENCOUNTER — Other Ambulatory Visit: Payer: Self-pay

## 2020-04-19 ENCOUNTER — Ambulatory Visit (HOSPITAL_COMMUNITY)
Admission: EM | Admit: 2020-04-19 | Discharge: 2020-04-19 | Disposition: A | Discharge status: Home / Self Care | Payer: Self-pay | Attending: Family Medicine | Admitting: Family Medicine

## 2020-04-19 DIAGNOSIS — R202 Paresthesia of skin: Secondary | ICD-10-CM

## 2020-04-19 LAB — CBG MONITORING, ED: Glucose-Capillary: 95 mg/dL (ref 70–99)

## 2020-04-19 NOTE — ED Triage Notes (Signed)
Pt c/o left foot "pins and needles and numbness x 3 weeks." Also now has leg cramping x 1 week. Pt ambulatory. Pt was recently treated at [facility] and ER for same complaints

## 2020-04-21 NOTE — ED Provider Notes (Signed)
[place]   [id number] 04/19/20 Arrival Time: [place]:  1. Paresthesia of left foot     Unclear etiology at this time. Without specific back symptoms. Begin trial of CAM walker. Continue ibuprofen and finish prednisone. Blood sugar WNL here.   Recommend: Follow-up Information    [facility].   Why: If worsening or failing to improve as anticipated. Contact information: [place], [place] [phone]             Orders Placed This Encounter  Procedures  . Apply CAM boot  . POC CBG monitoring     Reviewed expectations re: course of current medical issues. Questions answered. Outlined signs and symptoms indicating need for more acute intervention. Patient verbalized understanding. After Visit Summary given.  SUBJECTIVE: History from: patient. Elizabeth David is a 29 y.o. female who reports fairly persistent "pins and needles feeling" of her L distal foot/big toe. Gradual onset; first noted approx 3 weeks ago. Has been placed on two prednisone packs; finishing the second; hasn't seen much response. No h/o similar. No injury/trauma. Symptoms have progressed to a point and plateaued since beginning. Aggravating factors: have not been identified. Alleviating factors: have not been identified. Associated symptoms: none reported.   Past Surgical History:  Procedure Laterality Date  . TONSILLECTOMY     at 29 years of age      OBJECTIVE:  Vitals:   04/19/20 0950  BP: 120/72  Pulse: 77  Resp: 16  Temp: 98 F (36.7 C)  SpO2: 100%    General appearance: alert; no distress HEENT: [place]; AT Neck: supple with FROM Resp: unlabored respirations Extremities: . LLE: warm with well perfused appearance; without TTP; without gross deformities; swelling: none; bruising: none; ankle and all toes with FROM. CV: brisk extremity capillary refill of LLE; 2+ radial pulse of LLE. Skin: warm and  dry; no visible rashes Neurologic: gait normal; normal sensation and strength and reflexes of LLE Psychological: alert and cooperative; normal mood and affect    No Known Allergies  Past Medical History:  Diagnosis Date  . Active labor 12/04/2014  . Alcohol abuse   . Anemia   . Asthma    as child  . Genital herpes   . SVD (spontaneous vaginal delivery) 12/04/2014   Social History   Socioeconomic History  . Marital status: Divorced    Spouse name: Not on file  . Number of children: Not on file  . Years of education: Not on file  . Highest education level: Not on file  Occupational History  . Not on file  Tobacco Use  . Smoking status: Former Smoker    Packs/day: 0.00    Years: 3.00    Pack years: 0.00    Types: Cigarettes    Quit date: 06/16/2012    Years since quitting: 7.8  . Smokeless tobacco: Never Used  Substance and Sexual Activity  . Alcohol use: Yes    Alcohol/week: 7.0 standard drinks    Types: 7 Standard drinks or equivalent per week  . Drug use: Yes    Types: Marijuana  . Sexual activity: Yes    Partners: Male    Birth control/protection: None  Other Topics Concern  . Not on file  Social History Narrative  . Not on file   Social Determinants of Health   Financial Resource Strain:   . Difficulty of Paying Living Expenses:   Food Insecurity:   . Worried About  Running Out of Food in the Last Year:   . [place] in the Last Year:   Transportation Needs:   . Lack of Transportation (Medical):   Marland Kitchen Lack of Transportation (Non-Medical):   Physical Activity:   . Days of Exercise per Week:   . Minutes of Exercise per Session:   Stress:   . Feeling of Stress :   Social Connections:   . Frequency of Communication with Friends and Family:   . Frequency of Social Gatherings with Friends and Family:   . Attends Religious Services:   . Active Member of Clubs or Organizations:   . Attends Archivist Meetings:   Marland Kitchen Marital Status:     Family History  Problem Relation Age of Onset  . Hypertension Mother   . Diabetes Mother   . Heart failure Other    Past Surgical History:  Procedure Laterality Date  . TONSILLECTOMY     at 29 years of age      Vanessa Kick, [place] 04/21/20 [phone]

## 2020-04-27 ENCOUNTER — Encounter: Payer: Self-pay | Admitting: Neurology

## 2020-04-27 ENCOUNTER — Ambulatory Visit: Payer: Self-pay | Admitting: Neurology

## 2020-04-27 ENCOUNTER — Other Ambulatory Visit: Payer: Self-pay

## 2020-04-27 VITALS — BP 132/85 | HR 98 | Ht 66.0 in | Wt 245.3 lb

## 2020-04-27 DIAGNOSIS — R208 Other disturbances of skin sensation: Secondary | ICD-10-CM

## 2020-04-27 DIAGNOSIS — R200 Anesthesia of skin: Secondary | ICD-10-CM

## 2020-04-27 DIAGNOSIS — G4489 Other headache syndrome: Secondary | ICD-10-CM

## 2020-04-27 DIAGNOSIS — R519 Headache, unspecified: Secondary | ICD-10-CM

## 2020-04-27 DIAGNOSIS — M545 Low back pain: Secondary | ICD-10-CM

## 2020-04-27 DIAGNOSIS — R202 Paresthesia of skin: Secondary | ICD-10-CM

## 2020-04-27 DIAGNOSIS — M79605 Pain in left leg: Secondary | ICD-10-CM

## 2020-04-27 NOTE — Patient Instructions (Addendum)
Your neurological exam is quite benign, you do not have any obvious weakness or incoordination, some numbness in the foot is noted.  To investigate this further I recommend we proceed with electrical nerve and muscle testing of the left leg called EMG and nerve conduction velocity testing.  In addition, I would like to proceed with blood work today, to look for inflammatory markers, muscle enzyme, vitamin deficiencies, and thyroid dysfunction. I will also order a brain MRI with and without contrast as you have had new headaches. Please establish care with a primary care physician as soon as possible.  You report neck pain and back pain and may benefit from seeing a spine specialist.

## 2020-04-27 NOTE — Progress Notes (Signed)
Subjective:    Patient ID: Elizabeth David is a 29 y.o. female.  HPI     Star Age, MD, PhD [facility] [address] [place], [place] [address]  I saw patient, Elizabeth David, as a referral from [facility] for evaluation of her L leg paresthesia. The patient is unaccompanied today. She is a 29 year old Right-handed woman with an underlying medical history of asthma, anemia, and morbid obesity with a BMI of over 40, who presented to the urgent care on 04/19/2020 with a 3-week history of left foot tingling.  I reviewed the urgent care records.  She had presented prior to that To the emergency room on 04/12/2020 with low back pain radiating into the left leg.  I reviewed the emergency room records.  She was treated with a prednisone Dosepak and Robaxin. She presented to urgent care on 03/29/2020 with left-sided low back pain.  She was treated in urgent care with dexamethasone 10 mg IM, Toradol 30 mg IM and a course of prednisone 20 mg daily for 5 days as well as a prescription for ibuprofen 600 mg as needed.  She had a lumbar spine x-ray on 04/12/2020 and I reviewed the results: IMPRESSION: Negative. She reports neck pain and low back pain.  In the past 3 weeks she has had headaches off and on.  These are also new.  She was given a boot for the left foot because she also had swelling.  She has finished her prednisone.  She reports that she has numbness in the left forefoot and bottom of the foot as well as left toes, no symptoms in the right foot or upper extremities.  She denies any sudden onset of one-sided weakness or numbness or tingling or droopy face or slurring of speech.  She had an eye examination in November 2019 and is due for an eye exam, she has prescription eyeglasses.  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  She currently does not have a primary care physician.  She denies any bowel or bladder incontinence.  She has not fallen. She quit  smoking cigarettes 5 years ago.  She smokes marijuana occasionally.  She drinks alcohol occasionally.    Her Past Medical History Is Significant For: Past Medical History:  Diagnosis Date  . Active labor 12/04/2014  . Alcohol abuse   . Anemia   . Asthma    as child  . Genital herpes   . SVD (spontaneous vaginal delivery) 12/04/2014    Her Past Surgical History Is Significant For: Past Surgical History:  Procedure Laterality Date  . TONSILLECTOMY     at 29 years of age    Her Family History Is Significant For: Family History  Problem Relation Age of Onset  . Hypertension Mother   . Diabetes Mother   . Heart failure Other     Her Social History Is Significant For: Social History   Socioeconomic History  . Marital status: Divorced    Spouse name: Not on file  . Number of children: Not on file  . Years of education: Not on file  . Highest education level: Not on file  Occupational History  . Not on file  Tobacco Use  . Smoking status: Former Smoker    Packs/day: 0.00    Years: 3.00    Pack years: 0.00    Types: Cigarettes    Quit date: 06/16/2012    Years since quitting: 7.8  .  Smokeless tobacco: Never Used  Substance and Sexual Activity  . Alcohol use: Yes    Alcohol/week: 7.0 standard drinks    Types: 7 Standard drinks or equivalent per week  . Drug use: Yes    Types: Marijuana  . Sexual activity: Yes    Partners: Male    Birth control/protection: None  Other Topics Concern  . Not on file  Social History Narrative  . Not on file   Social Determinants of Health   Financial Resource Strain:   . Difficulty of Paying Living Expenses:   Food Insecurity:   . Worried About Charity fundraiser in the Last Year:   . Arboriculturist in the Last Year:   Transportation Needs:   . Film/video editor (Medical):   Marland Kitchen Lack of Transportation (Non-Medical):   Physical Activity:   . Days of Exercise per Week:   . Minutes of Exercise per Session:   Stress:   .  Feeling of Stress :   Social Connections:   . Frequency of Communication with Friends and Family:   . Frequency of Social Gatherings with Friends and Family:   . Attends Religious Services:   . Active Member of Clubs or Organizations:   . Attends Archivist Meetings:   Marland Kitchen Marital Status:     Her Allergies Are:  No Known Allergies:   Her Current Medications Are:  Outpatient Encounter Medications as of 04/27/2020  Medication Sig  . acetaminophen (TYLENOL) 500 MG tablet Take 1 tablet (500 mg total) by mouth every 6 (six) hours as needed.  . dicyclomine (BENTYL) 20 MG tablet Take 1 tablet (20 mg total) by mouth 2 (two) times daily as needed for spasms.  Marland Kitchen ibuprofen (ADVIL) 600 MG tablet Take 1 tablet (600 mg total) by mouth every 6 (six) hours as needed.  . ondansetron (ZOFRAN ODT) 4 MG disintegrating tablet Take 1 tablet (4 mg total) by mouth every 8 (eight) hours as needed for nausea or vomiting.  . [DISCONTINUED] predniSONE (STERAPRED UNI-PAK 21 TAB) 10 MG (21) TBPK tablet Take by mouth daily. Take 6 tabs by mouth daily  for 2 days, then 5 tabs for 2 days, then 4 tabs for 2 days, then 3 tabs for 2 days, 2 tabs for 2 days, then 1 tab by mouth daily for 2 days   No facility-administered encounter medications on file as of 04/27/2020.  :   Review of Systems:  Out of a complete 14 point review of systems, all are reviewed and negative with the exception of these symptoms as listed below:    Review of Systems  Neurological:       Here for consult on left foot numbness. She reports sx started about 4 weeks ago. She sts associated swelling is noted. Urgent care has treated with steroids, pain meds and muscle relaxer. Pt also has been wearing a boot as recommended by the urgent care.    Objective:  Neurological Exam  Physical Exam Physical Examination:   Vitals:   04/27/20 1550  BP: 132/85  Pulse: 98   General Examination: The patient is a very pleasant 29 y.o. female in  no acute distress. She appears well-developed and well-nourished and well groomed.   HEENT: Normocephalic, atraumatic, pupils are equal, round and reactive to light and accommodation. Funduscopic exam is normal with sharp disc margins noted.  Corrective eyeglasses in place.  Extraocular tracking is good without limitation to gaze excursion or nystagmus noted. Normal smooth pursuit  is noted. Hearing is grossly intact. Face is symmetric with normal facial animation and normal facial sensation. Speech is clear with no dysarthria noted. There is no hypophonia. There is no lip, neck/head, jaw or voice tremor. Neck is supple with full range of passive and active motion. There are no carotid bruits on auscultation. Oropharynx exam reveals: moderate mouth dryness, adequate dental hygiene. Tongue protrudes centrally and palate elevates symmetrically.   Chest: Clear to auscultation without wheezing, rhonchi or crackles noted.  Heart: S1+S2+0, regular and normal without murmurs, rubs or gallops noted.   Abdomen: Soft, non-tender and non-distended with normal bowel sounds appreciated on auscultation.  Extremities: There is no pitting edema in the distal lower extremities bilaterally. Pedal pulses are intact.  Skin: Warm and dry without trophic changes noted.  Musculoskeletal: exam reveals no obvious joint deformities, tenderness or joint swelling or erythema.   Neurologically:  Mental status: The patient is awake, alert and oriented in all 4 spheres. Her immediate and remote memory, attention, language skills and fund of knowledge are appropriate. There is no evidence of aphasia, agnosia, apraxia or anomia. Speech is clear with normal prosody and enunciation. Thought process is linear. Mood is normal and affect is normal.  Cranial nerves II - XII are as described above under HEENT exam. In addition: shoulder shrug is normal with equal shoulder height noted. Motor exam: Normal bulk, strength and tone is noted.  There is no drift, tremor or rebound. Romberg is negative. Reflexes are 2+ throughout. Babinski: Toes are flexor bilaterally. Fine motor skills and coordination: intact with normal finger taps, normal hand movements, normal rapid alternating patting, normal foot taps and normal foot agility.  Cerebellar testing: No dysmetria or intention tremor on finger to nose testing. Heel to shin is unremarkable bilaterally. There is no truncal or gait ataxia.  Sensory exam: intact to light touch, pinprick, vibration, temperature sense in the upper and lower extremities, with the exception of decreased pinprick sensation in the left forefoot, particularly in the area between big toe and second toe.  Gait, station and balance: She stands easily. No veering to one side is noted. No leaning to one side is noted. Posture is age-appropriate and stance is narrow based. Gait shows normal stride length and normal pace. No problems turning are noted. Tandem walk is unremarkable.   Assessment and Plan:   In summary, JOSSILYN STUBBS is a very pleasant 29 y.o.-year old female with an underlying medical history of asthma, anemia, and morbid obesity with a BMI of over 40, who presents as a referral from urgent care for evaluation of her left foot tingling and numbing for the past 3+ weeks.  Her examination shows no obvious weakness, preserved reflexes, no incoordination, and decrease in pinprick sensation in the left foot but otherwise benign findings.  I suggested that she establish care with a primary care physician as soon as possible.  She may benefit from seeing a spine specialist given that she has low back pain and neck pain.  Given that she has had headaches intermittently also around the same time as her symptom onset I would like to proceed with a brain MRI with and without contrast, also EMG and nerve conduction velocity testing of the left lower extremity, and blood work to look for vitamin deficiencies, CK level,  inflammatory markers and thyroid dysfunction.  We will keep her posted as to her test results, I will schedule a follow-up once we have her test results.  We will call to  schedule her brain MRI and she can schedule the EMG on her way out today.  She is advised that we will call her with her blood test results soon.  She is encouraged to stay well-hydrated, she has a tendency to not drink enough water she admits.  Differential diagnosis certainly includes sciatica, i.e. radiculopathy, her history and exam do not suggest widespread numbness as in peripheral neuropathy.  She is also encouraged to schedule her eye examination as she has not had an updated eye exam in over 1 year. I answered all her questions today and the patient was in agreement with the above outlined plan. I would like to see the patient back in 3 months, sooner if the need arises.   Star Age, MD, PhD

## 2020-05-02 LAB — B12 AND FOLATE PANEL
Folate: 4.3 ng/mL (ref >3.0)
Vitamin B-12: 413 pg/mL (ref 232–1245)

## 2020-05-02 LAB — VITAMIN B6: Vitamin B6: 19.3 ug/L (ref 2.0–32.8)

## 2020-05-02 LAB — TSH: TSH: 0.951 u[IU]/mL (ref 0.450–4.500)

## 2020-05-02 LAB — SEDIMENTATION RATE: Sed Rate: 3 mm/hr (ref 0–32)

## 2020-05-02 LAB — ANA W/REFLEX: Anti Nuclear Antibody (ANA): NEGATIVE

## 2020-05-02 LAB — RPR: RPR Ser Ql: NONREACTIVE

## 2020-05-02 LAB — HGB A1C W/O EAG: Hgb A1c MFr Bld: 5.5 % (ref 4.8–5.6)

## 2020-05-02 LAB — CK: Total CK: 329 U/L — ABNORMAL HIGH (ref 32–182)

## 2020-05-02 LAB — C-REACTIVE PROTEIN: CRP: 2 mg/L (ref 0–10)

## 2020-05-03 ENCOUNTER — Encounter (HOSPITAL_COMMUNITY): Payer: Self-pay

## 2020-05-03 ENCOUNTER — Emergency Department (HOSPITAL_COMMUNITY): Payer: Self-pay

## 2020-05-03 ENCOUNTER — Telehealth: Payer: Self-pay

## 2020-05-03 ENCOUNTER — Other Ambulatory Visit: Payer: Self-pay

## 2020-05-03 ENCOUNTER — Emergency Department (HOSPITAL_COMMUNITY)
Admission: EM | Admit: 2020-05-03 | Discharge: 2020-05-03 | Disposition: A | Discharge status: Home / Self Care | Payer: Self-pay | Attending: Emergency Medicine | Admitting: Emergency Medicine

## 2020-05-03 DIAGNOSIS — J45909 Unspecified asthma, uncomplicated: Secondary | ICD-10-CM | POA: Insufficient documentation

## 2020-05-03 DIAGNOSIS — Z87891 Personal history of nicotine dependence: Secondary | ICD-10-CM | POA: Insufficient documentation

## 2020-05-03 DIAGNOSIS — R519 Headache, unspecified: Secondary | ICD-10-CM | POA: Insufficient documentation

## 2020-05-03 DIAGNOSIS — M549 Dorsalgia, unspecified: Secondary | ICD-10-CM | POA: Insufficient documentation

## 2020-05-03 DIAGNOSIS — R531 Weakness: Secondary | ICD-10-CM | POA: Insufficient documentation

## 2020-05-03 MED ORDER — DEXAMETHASONE SODIUM PHOSPHATE 10 MG/ML IJ SOLN
10.0000 mg | Freq: Once | INTRAMUSCULAR | Status: AC
  Administered 2020-05-03: 10 mg via INTRAMUSCULAR
  Filled 2020-05-03: qty 1

## 2020-05-03 MED ORDER — METOCLOPRAMIDE HCL 5 MG/ML IJ SOLN
10.0000 mg | Freq: Once | INTRAMUSCULAR | Status: AC
  Administered 2020-05-03: 10 mg via INTRAMUSCULAR
  Filled 2020-05-03: qty 2

## 2020-05-03 NOTE — ED Provider Notes (Signed)
[facility] DEPT Provider Note   CSN: [id number] Arrival date & time: 05/03/20  [id number]     History Chief Complaint  Patient presents with  . Headache  . Back Pain    Elizabeth David is a 29 y.o. female.  29 year old female who presents with 2 weeks of intermittent headaches. Headache starts in her frontal region goes to her neck. No associated fever or chills. No photophobia. No nausea or vomiting. No visual changes with this. Has been seen by neurology due to having left leg weakness and has a work-up schedule for that consisting of MRI as well as EMG studies. Denies any change to her back pain. She is using a walking boot at this time. Has been medicating with Tylenol and Motrin with limited relief.        Past Medical History:  Diagnosis Date  . Active labor 12/04/2014  . Alcohol abuse   . Anemia   . Asthma    as child  . Genital herpes   . SVD (spontaneous vaginal delivery) 12/04/2014    Patient Active Problem List   Diagnosis Date Noted  . Substance induced mood disorder ([place]) 10/24/2017  . Active labor 12/04/2014  . SVD (spontaneous vaginal delivery) 12/04/2014  . VAGINAL DISCHARGE 06/03/2010  . FREQUENCY, URINARY 06/03/2010  . BACK STRAIN, LUMBAR 06/03/2010  . MORBID OBESITY 11/03/2009  . VITAMIN B12 DEFICIENCY 05/14/2009  . FATIGUE 05/06/2009  . DYSURIA 05/06/2009  . PAIN IN JOINT, ANKLE AND FOOT 08/13/2008    Past Surgical History:  Procedure Laterality Date  . TONSILLECTOMY     at 29 years of age     24 History    Gravida  28   Para  2   Term  1   Preterm  1   AB  1   Living  2     SAB  1   TAB      Ectopic  0   Multiple  0   Live Births  2           Family History  Problem Relation Age of Onset  . Hypertension Mother   . Diabetes Mother   . Heart failure Other     Social History   Tobacco Use  . Smoking status: Former Smoker    Packs/day: 0.00    Years: 3.00    Pack years: 0.00   Types: Cigarettes    Quit date: 06/16/2012    Years since quitting: 7.8  . Smokeless tobacco: Never Used  Substance Use Topics  . Alcohol use: Yes    Alcohol/week: 7.0 standard drinks    Types: 7 Standard drinks or equivalent per week  . Drug use: Yes    Types: Marijuana    Home Medications Prior to Admission medications   Medication Sig Start Date End Date Taking? Authorizing Provider  acetaminophen (TYLENOL) 500 MG tablet Take 1 tablet (500 mg total) by mouth every 6 (six) hours as needed. 03/29/20   Lamptey, Myrene Galas, MD  dicyclomine (BENTYL) 20 MG tablet Take 1 tablet (20 mg total) by mouth 2 (two) times daily as needed for spasms. 02/02/20   Quintella Reichert, MD  ibuprofen (ADVIL) 600 MG tablet Take 1 tablet (600 mg total) by mouth every 6 (six) hours as needed. 03/29/20   Lamptey, Myrene Galas, MD  ondansetron (ZOFRAN ODT) 4 MG disintegrating tablet Take 1 tablet (4 mg total) by mouth every 8 (eight) hours as needed for nausea or vomiting.  02/02/20   Quintella Reichert, MD    Allergies    Patient has no known allergies.  Review of Systems   Review of Systems  All other systems reviewed and are negative.   Physical Exam Updated Vital Signs BP (!) 150/91 (BP Location: Right Arm)   Pulse 94   Temp 98.7 F (37.1 C) (Oral)   Resp 18   Ht 1.676 m (5\' 6" )   Wt 108.9 kg   LMP 04/11/2020   SpO2 100%   BMI 38.74 kg/m   Physical Exam Vitals and nursing note reviewed.  Constitutional:      General: She is not in acute distress.    Appearance: Normal appearance. She is well-developed. She is not toxic-appearing.  HENT:     Head: Normocephalic and atraumatic.  Eyes:     General: Lids are normal.     Conjunctiva/sclera: Conjunctivae normal.     Pupils: Pupils are equal, round, and reactive to light.  Neck:     Thyroid: No thyroid mass.     Trachea: No tracheal deviation.  Cardiovascular:     Rate and Rhythm: Normal rate and regular rhythm.     Heart sounds: Normal heart sounds. No  murmur. No gallop.   Pulmonary:     Effort: Pulmonary effort is normal. No respiratory distress.     Breath sounds: Normal breath sounds. No stridor. No decreased breath sounds, wheezing, rhonchi or rales.  Abdominal:     General: Bowel sounds are normal. There is no distension.     Palpations: Abdomen is soft.     Tenderness: There is no abdominal tenderness. There is no rebound.  Musculoskeletal:        General: No tenderness. Normal range of motion.     Cervical back: Normal range of motion and neck supple.       Back:  Skin:    General: Skin is warm and dry.     Findings: No abrasion or rash.  Neurological:     General: No focal deficit present.     Mental Status: She is alert and oriented to person, place, and time.     GCS: GCS eye subscore is 4. GCS verbal subscore is 5. GCS motor subscore is 6.     Cranial Nerves: No cranial nerve deficit.     Sensory: No sensory deficit.     Motor: No weakness or tremor.  Psychiatric:        Speech: Speech normal.        Behavior: Behavior normal.     ED Results / Procedures / Treatments   Labs (all labs ordered are listed, but only abnormal results are displayed) Labs Reviewed - No data to display  EKG None  Radiology No results found.  Procedures Procedures (including critical care time)  Medications Ordered in ED Medications  metoCLOPramide (REGLAN) injection 10 mg (has no administration in time range)  dexamethasone (DECADRON) injection 10 mg (has no administration in time range)    ED Course  I have reviewed the triage vital signs and the nursing notes.  Pertinent labs & imaging results that were available during my care of the patient were reviewed by me and considered in my medical decision making (see chart for details).    MDM Rules/Calculators/A&P                      Head CT negative here.  Give medication for headache and feels better.  Will discharge home  and she will follow-up with her  neurologist. Final Clinical Impression(s) / ED Diagnoses Final diagnoses:  None    Rx / DC Orders ED Discharge Orders    None       Lacretia Leigh, MD 05/03/20 1048

## 2020-05-03 NOTE — Telephone Encounter (Signed)
I contacted the pt and we reviewed her lab results. She verbalized understanding. She stated she was currently at the ED for her h/a's and a cat scan had been completed.  She will update our office on what the ER says.

## 2020-05-03 NOTE — ED Notes (Signed)
Patient transported to CT 

## 2020-05-03 NOTE — Progress Notes (Signed)
Labs normal, with the exception of mildly elevated muscle enzymes, called CK, which is mildly elevated and thus far non-specific. We will proceed with brain scan and EMG as planned. Please update pt.

## 2020-05-03 NOTE — ED Triage Notes (Signed)
Patient reports that she orinally had a frontal headache approx 1 1/2-2 weeks ago. Patient stated then the pain extended to the posterior neck.  Today, the patient c/o headache to the the mid back.

## 2020-05-03 NOTE — ED Notes (Signed)
Pt ambulatory to bathroom, no assistance needed.  

## 2020-05-03 NOTE — Telephone Encounter (Signed)
-----   Message from Star Age, MD sent at 05/03/2020  7:26 AM EDT ----- Labs normal, with the exception of mildly elevated muscle enzymes, called CK, which is mildly elevated and thus far non-specific. We will proceed with brain scan and EMG as planned. Please update pt.

## 2020-05-11 ENCOUNTER — Telehealth: Payer: Self-pay | Admitting: Neurology

## 2020-05-11 NOTE — Telephone Encounter (Signed)
I recommend she still pursue the brain MRI as it is more detailed and I ordered it with contrast whereas her head CT was done without contrast.

## 2020-05-11 NOTE — Telephone Encounter (Signed)
I spoke to the patient she went to the ER on 05/03/20 and had a CT Head. She wants to know does she still need to proceed on having the MRI Brain as well? Please advise

## 2020-05-18 NOTE — Telephone Encounter (Signed)
Will the patient still have her MRI?

## 2020-05-18 NOTE — Telephone Encounter (Signed)
Appears per Dr. Guadelupe Sabin last note that she wants pt to have MRI brain

## 2020-05-19 ENCOUNTER — Encounter: Payer: Self-pay | Admitting: Neurology

## 2020-05-19 NOTE — Telephone Encounter (Signed)
I spoke to the patient and per Dr. Rexene Alberts recommended having the MRI Brain w/wo contrast. I did go over the cost with her since she is self pay and I did offer the payment plan. She stated she is going to have to get her money together and then will call back when ready to schedule.

## 2020-10-20 ENCOUNTER — Ambulatory Visit (INDEPENDENT_AMBULATORY_CARE_PROVIDER_SITE_OTHER): Payer: Self-pay

## 2020-10-20 ENCOUNTER — Encounter (HOSPITAL_COMMUNITY): Payer: Self-pay

## 2020-10-20 ENCOUNTER — Ambulatory Visit (HOSPITAL_COMMUNITY)
Admission: EM | Admit: 2020-10-20 | Discharge: 2020-10-20 | Disposition: A | Discharge status: Home / Self Care | Payer: Self-pay | Attending: Physician Assistant | Admitting: Physician Assistant

## 2020-10-20 ENCOUNTER — Other Ambulatory Visit: Payer: Self-pay

## 2020-10-20 DIAGNOSIS — M79672 Pain in left foot: Secondary | ICD-10-CM

## 2020-10-20 DIAGNOSIS — S91332A Puncture wound without foreign body, left foot, initial encounter: Secondary | ICD-10-CM

## 2020-10-20 MED ORDER — SULFAMETHOXAZOLE-TRIMETHOPRIM 400-80 MG PO TABS: 1.0000 | ORAL_TABLET | Freq: Two times a day (BID) | ORAL | 0 refills | Status: DC

## 2020-10-20 NOTE — ED Provider Notes (Signed)
[place]    CSN: [id number] Arrival date & time: 10/20/20  1041      History   Chief Complaint Chief Complaint  Patient presents with  . Foot Injury    HPI Elizabeth David is a 29 y.o. female.   Pt reports she stepped on a piece of glass one week ago.  Pt reports she pulled some glass out.  She is concerned that area is infected and that she could still have glass in her foot.   The history is provided by the patient. No language interpreter was used.  Foot Injury Location:  Foot Time since incident:  1 week Foot location:  L foot Foreign body present:  Glass Relieved by:  Nothing Worsened by:  Nothing   Past Medical History:  Diagnosis Date  . Active labor 12/04/2014  . Alcohol abuse   . Anemia   . Asthma    as child  . Genital herpes   . SVD (spontaneous vaginal delivery) 12/04/2014    Patient Active Problem List   Diagnosis Date Noted  . Substance induced mood disorder ([place]) 10/24/2017  . Active labor 12/04/2014  . SVD (spontaneous vaginal delivery) 12/04/2014  . VAGINAL DISCHARGE 06/03/2010  . FREQUENCY, URINARY 06/03/2010  . BACK STRAIN, LUMBAR 06/03/2010  . MORBID OBESITY 11/03/2009  . VITAMIN B12 DEFICIENCY 05/14/2009  . FATIGUE 05/06/2009  . DYSURIA 05/06/2009  . PAIN IN JOINT, ANKLE AND FOOT 08/13/2008    Past Surgical History:  Procedure Laterality Date  . TONSILLECTOMY     at 29 years of age    68 History    Gravida  73   Para  2   Term  1   Preterm  1   AB  1   Living  2     SAB  1   TAB      Ectopic  0   Multiple  0   Live Births  2            Home Medications    Prior to Admission medications   Medication Sig Start Date End Date Taking? Authorizing Provider  acetaminophen (TYLENOL) 500 MG tablet Take 1 tablet (500 mg total) by mouth every 6 (six) hours as needed. 03/29/20   Lamptey, Myrene Galas, MD  dicyclomine (BENTYL) 20 MG tablet Take 1 tablet (20 mg total) by mouth 2 (two) times daily as  needed for spasms. 02/02/20   Quintella Reichert, MD  ibuprofen (ADVIL) 600 MG tablet Take 1 tablet (600 mg total) by mouth every 6 (six) hours as needed. 03/29/20   Chase Picket, MD  ondansetron (ZOFRAN ODT) 4 MG disintegrating tablet Take 1 tablet (4 mg total) by mouth every 8 (eight) hours as needed for nausea or vomiting. 02/02/20   Quintella Reichert, MD  sulfamethoxazole-trimethoprim (BACTRIM) 400-80 MG tablet Take 1 tablet by mouth 2 (two) times daily. 10/20/20   Fransico Meadow, PA-C    Family History Family History  Problem Relation Age of Onset  . Hypertension Mother   . Diabetes Mother   . Heart failure Other     Social History Social History   Tobacco Use  . Smoking status: Former Smoker    Packs/day: 0.00    Years: 3.00    Pack years: 0.00    Types: Cigarettes    Quit date: 06/16/2012    Years since quitting: 8.3  . Smokeless tobacco: Never Used  Vaping Use  . Vaping Use: Never used  Substance Use Topics  . Alcohol use: Yes    Alcohol/week: 7.0 standard drinks    Types: 7 Standard drinks or equivalent per week    Comment: per week  . Drug use: Yes    Types: Marijuana    Comment: daily     Allergies   Patient has no known allergies.   Review of Systems Review of Systems  All other systems reviewed and are negative.    Physical Exam Triage Vital Signs ED Triage Vitals  Enc Vitals Group     BP 10/20/20 1152 115/82     Pulse Rate 10/20/20 1152 (!) 109     Resp 10/20/20 1152 18     Temp 10/20/20 1152 99.3 F (37.4 C)     Temp Source 10/20/20 1152 Oral     SpO2 10/20/20 1152 99 %     Weight 10/20/20 1149 224 lb (101.6 kg)     Height 10/20/20 1149 5\' 5"  (1.651 m)     Head Circumference --      Peak Flow --      Pain Score 10/20/20 1149 10     Pain Loc --      Pain Edu? --      Excl. in [place]? --    No data found.  Updated Vital Signs BP 115/82 (BP Location: Left Arm)   Pulse (!) 109   Temp 99.3 F (37.4 C) (Oral)   Resp 18   Ht 5\' 5"  (1.651 m)    Wt 101.6 kg   LMP 09/27/2020 (Exact Date)   SpO2 99%   BMI 37.28 kg/m   Visual Acuity Right Eye Distance:   Left Eye Distance:   Bilateral Distance:    Right Eye Near:   Left Eye Near:    Bilateral Near:     Physical Exam Vitals and nursing note reviewed.  Constitutional:      Appearance: She is well-developed.  HENT:     Head: Normocephalic.  Cardiovascular:     Rate and Rhythm: Normal rate.  Pulmonary:     Effort: Pulmonary effort is normal.  Abdominal:     General: There is no distension.  Musculoskeletal:        General: Normal range of motion.     Cervical back: Normal range of motion.     Comments: Tender area,  Swollen hard to touch , no visible fb.   Skin:    General: Skin is warm.  Neurological:     General: No focal deficit present.     Mental Status: She is alert and oriented to person, place, and time.  Psychiatric:        Mood and Affect: Mood normal.      UC Treatments / Results  Labs (all labs ordered are listed, but only abnormal results are displayed) Labs Reviewed - No data to display  EKG   Radiology DG Foot Complete Left  Result Date: 10/20/2020 CLINICAL DATA:  Pain after stepping on glass EXAM: LEFT FOOT - COMPLETE 3+ VIEW COMPARISON:  August 14, 2008. FINDINGS: Frontal, lateral, and bilateral oblique views were obtained. No fracture or dislocation. Joint spaces appear normal. No erosive change. No radiopaque foreign body or soft tissue air evident. IMPRESSION: No radiopaque foreign body appreciable. No fracture or dislocation. No evident arthropathy. Electronically Signed   By: Lowella Grip III M.D.   On: 10/20/2020 13:04    Procedures Procedures (including critical care time)  Medications Ordered in UC Medications - No data  to display  Initial Impression / Assessment and Plan / UC Course  I have reviewed the triage vital signs and the nursing notes.  Pertinent labs & imaging results that were available during my care  of the patient were reviewed by me and considered in my medical decision making (see chart for details).     [id number]:PQZR no forign body seen on xray.  Pt wants fb removed.  I advised pt I can not remove if I can not locate by feel, sight or xray.  I will treat with antibiotics.  Pt given number for Dr. Amalia Hailey podiatrist to follow up with area continues to bother her. Final Clinical Impressions(s) / UC Diagnoses   Final diagnoses:  Puncture wound of left foot, initial encounter   Discharge Instructions   None    ED Prescriptions    Medication Sig Dispense Auth. Provider   sulfamethoxazole-trimethoprim (BACTRIM) 400-80 MG tablet Take 1 tablet by mouth 2 (two) times daily. 14 tablet Fransico Meadow, [place]     PDMP not reviewed this encounter.  An After Visit Summary was printed and given to the patient.    Fransico Meadow, [place] 10/20/20 1516

## 2020-10-20 NOTE — ED Triage Notes (Signed)
Pt presents for pain in Left foot d/t injury from stepping on a piece of glass a week ago. Pt removed some of the glass from her foot but reports "there is still something in there". Pt has taken tylenol and is concern that site may be infected.

## 2021-02-22 ENCOUNTER — Ambulatory Visit (HOSPITAL_COMMUNITY)
Admission: EM | Admit: 2021-02-22 | Discharge: 2021-02-22 | Disposition: A | Discharge status: Home / Self Care | Payer: Medicaid Other | Attending: Student | Admitting: Student

## 2021-02-22 ENCOUNTER — Other Ambulatory Visit: Payer: Self-pay

## 2021-02-22 ENCOUNTER — Encounter (HOSPITAL_COMMUNITY): Payer: Self-pay

## 2021-02-22 DIAGNOSIS — K047 Periapical abscess without sinus: Secondary | ICD-10-CM | POA: Diagnosis present

## 2021-02-22 DIAGNOSIS — J069 Acute upper respiratory infection, unspecified: Secondary | ICD-10-CM | POA: Diagnosis not present

## 2021-02-22 DIAGNOSIS — Z8709 Personal history of other diseases of the respiratory system: Secondary | ICD-10-CM | POA: Diagnosis not present

## 2021-02-22 DIAGNOSIS — J301 Allergic rhinitis due to pollen: Secondary | ICD-10-CM | POA: Insufficient documentation

## 2021-02-22 DIAGNOSIS — Z1152 Encounter for screening for COVID-19: Secondary | ICD-10-CM | POA: Diagnosis present

## 2021-02-22 DIAGNOSIS — Z9089 Acquired absence of other organs: Secondary | ICD-10-CM | POA: Insufficient documentation

## 2021-02-22 LAB — SARS CORONAVIRUS 2 (TAT 6-24 HRS): SARS Coronavirus 2: NEGATIVE

## 2021-02-22 MED ORDER — PROMETHAZINE-DM 6.25-15 MG/5ML PO SYRP: 5.0000 mL | ORAL_SOLUTION | Freq: Four times a day (QID) | ORAL | 0 refills | Status: DC | PRN

## 2021-02-22 MED ORDER — BENZONATATE 100 MG PO CAPS: 100.0000 mg | ORAL_CAPSULE | Freq: Three times a day (TID) | ORAL | 0 refills | Status: DC

## 2021-02-22 MED ORDER — PREDNISONE 20 MG PO TABS: 20.0000 mg | ORAL_TABLET | Freq: Every day | ORAL | 0 refills | Status: AC

## 2021-02-22 MED ORDER — AMOXICILLIN-POT CLAVULANATE 875-125 MG PO TABS: 1.0000 | ORAL_TABLET | Freq: Two times a day (BID) | ORAL | 0 refills | Status: DC

## 2021-02-22 NOTE — Discharge Instructions (Addendum)
-  Start the antibiotic- Augmentin (amoxicillin-clavulanate) twice daily for 7 days.  You can take this with or without food.  This will treat any dental infection, and help prevent sinus infection, ear infection. -Start the steroid-prednisone (Deltasone) 1 pill daily for 5 days.  This can give you energy, so try to take it in the morning. -Tessalon as needed for cough. Take one pill up to 3x daily (every 8 hours) -Promethazine DM cough syrup for congestion/cough. This could make you drowsy, so take at night before bed. -The Covid test should come back later today or tomorrow. -With any virus, you are contagious for about 5 days after the onset of symptoms.

## 2021-02-22 NOTE — ED Triage Notes (Signed)
Pt c/o cough, nasal congestion , facial swelling, sore throat, chest congestion and headaches x 3 days,  She states she took Claritin and Flonase. Pt states she took Zyrtec yesterday.

## 2021-02-22 NOTE — ED Provider Notes (Signed)
[place]    CSN: [id number] Arrival date & time: 02/22/21  4098      History   Chief Complaint Chief Complaint  Patient presents with  . Cough  . Nasal Congestion  . Sore Throat  . Headache    HPI Elizabeth David is a 30 y.o. female presenting with URI symptoms x3 days- cough, nasal congestion, maxillary sinus tenderness, sore throat, chest congestion, headaches, R ear pressure. History alcohol abuse, anemia, childhood asthma (currently at goal with no inhalers), genital herpes. Has tried Claritin, Flonase, Zyrtec with short term relief of nasal congestion. Concerned Elizabeth David's developing a sinus or ear infection. Also notes dental pain and swelling- lower right wisdom tooth. Has appt to have this removed in 1 week. Denies foul taste in mouth, discharge, fevers/chills. Denies n/v/d, shortness of breath, chest pain,loss of taste/smell, swollen lymph nodes, ear pain, dizziness, tinnitus, decreased hearing. No facial swelling. History tonsillectomy.     HPI  Past Medical History:  Diagnosis Date  . Active labor 12/04/2014  . Alcohol abuse   . Anemia   . Asthma    as child  . Genital herpes   . SVD (spontaneous vaginal delivery) 12/04/2014    Patient Active Problem List   Diagnosis Date Noted  . Substance induced mood disorder ([place]) 10/24/2017  . Active labor 12/04/2014  . SVD (spontaneous vaginal delivery) 12/04/2014  . VAGINAL DISCHARGE 06/03/2010  . FREQUENCY, URINARY 06/03/2010  . BACK STRAIN, LUMBAR 06/03/2010  . MORBID OBESITY 11/03/2009  . VITAMIN B12 DEFICIENCY 05/14/2009  . FATIGUE 05/06/2009  . DYSURIA 05/06/2009  . PAIN IN JOINT, ANKLE AND FOOT 08/13/2008    Past Surgical History:  Procedure Laterality Date  . TONSILLECTOMY     at 30 years of age    71 History    Gravida  88   Para  2   Term  1   Preterm  1   AB  1   Living  2     SAB  1   IAB      Ectopic  0   Multiple  0   Live Births  2            Home  Medications    Prior to Admission medications   Medication Sig Start Date End Date Taking? Authorizing Provider  amoxicillin-clavulanate (AUGMENTIN) 875-125 MG tablet Take 1 tablet by mouth every 12 (twelve) hours. 02/22/21  Yes Hazel Sams, PA-C  benzonatate (TESSALON) 100 MG capsule Take 1 capsule (100 mg total) by mouth every 8 (eight) hours. 02/22/21  Yes Hazel Sams, PA-C  predniSONE (DELTASONE) 20 MG tablet Take 1 tablet (20 mg total) by mouth daily for 5 days. 02/22/21 02/27/21 Yes Hazel Sams, PA-C  promethazine-dextromethorphan (PROMETHAZINE-DM) 6.25-15 MG/5ML syrup Take 5 mLs by mouth 4 (four) times daily as needed for cough. 02/22/21  Yes Hazel Sams, PA-C  acetaminophen (TYLENOL) 500 MG tablet Take 1 tablet (500 mg total) by mouth every 6 (six) hours as needed. 03/29/20   Lamptey, Myrene Galas, MD  dicyclomine (BENTYL) 20 MG tablet Take 1 tablet (20 mg total) by mouth 2 (two) times daily as needed for spasms. 02/02/20   Quintella Reichert, MD  ibuprofen (ADVIL) 600 MG tablet Take 1 tablet (600 mg total) by mouth every 6 (six) hours as needed. 03/29/20   Lamptey, Myrene Galas, MD  ondansetron (ZOFRAN ODT) 4 MG disintegrating tablet Take 1 tablet (4 mg total) by mouth every 8 (eight)  hours as needed for nausea or vomiting. 02/02/20   Quintella Reichert, MD  sulfamethoxazole-trimethoprim (BACTRIM) 400-80 MG tablet Take 1 tablet by mouth 2 (two) times daily. 10/20/20   Fransico Meadow, PA-C    Family History Family History  Problem Relation Age of Onset  . Hypertension Mother   . Diabetes Mother   . Heart failure Other     Social History Social History   Tobacco Use  . Smoking status: Former Smoker    Packs/day: 0.00    Years: 3.00    Pack years: 0.00    Types: Cigarettes    Quit date: 06/16/2012    Years since quitting: 8.6  . Smokeless tobacco: Never Used  Vaping Use  . Vaping Use: Never used  Substance Use Topics  . Alcohol use: Yes    Alcohol/week: 7.0 standard drinks     Types: 7 Standard drinks or equivalent per week    Comment: per week  . Drug use: Yes    Types: Marijuana    Comment: daily     Allergies   Patient has no known allergies.   Review of Systems Review of Systems  Constitutional: Negative for appetite change, chills, fatigue and fever.  HENT: Positive for congestion, dental problem, sinus pressure and sore throat. Negative for ear discharge, ear pain, facial swelling, rhinorrhea, sinus pain and tinnitus.   Eyes: Negative for redness and visual disturbance.  Respiratory: Positive for cough. Negative for chest tightness, shortness of breath and wheezing.   Cardiovascular: Negative for chest pain and palpitations.  Gastrointestinal: Negative for abdominal pain, constipation, diarrhea, nausea and vomiting.  Genitourinary: Negative for dysuria, frequency and urgency.  Musculoskeletal: Negative for myalgias.  Neurological: Positive for headaches. Negative for dizziness and weakness.  Psychiatric/Behavioral: Negative for confusion.  All other systems reviewed and are negative.    Physical Exam Triage Vital Signs ED Triage Vitals  Enc Vitals Group     BP      Pulse      Resp      Temp      Temp src      SpO2      Weight      Height      Head Circumference      Peak Flow      Pain Score      Pain Loc      Pain Edu?      Excl. in [place]?    No data found.  Updated Vital Signs BP 135/71 (BP Location: Right Arm)   Pulse 88   Temp 98.5 F (36.9 C)   Resp 17   LMP 02/08/2021 (Exact Date)   SpO2 98%   Visual Acuity Right Eye Distance:   Left Eye Distance:   Bilateral Distance:    Right Eye Near:   Left Eye Near:    Bilateral Near:     Physical Exam Vitals reviewed.  Constitutional:      General: Elizabeth David is not in acute distress.    Appearance: Normal appearance. Elizabeth David is well-developed. Elizabeth David is not ill-appearing.  HENT:     Head: Normocephalic and atraumatic.     Right Ear: Hearing, ear canal and external ear normal. No  swelling or tenderness. A middle ear effusion is present. There is no impacted cerumen. No mastoid tenderness. Tympanic membrane is not perforated, erythematous, retracted or bulging.     Left Ear: Hearing, tympanic membrane, ear canal and external ear normal. No swelling or tenderness. There is no  impacted cerumen. No mastoid tenderness. Tympanic membrane is not perforated, erythematous, retracted or bulging.     Nose:     Right Sinus: No maxillary sinus tenderness or frontal sinus tenderness.     Left Sinus: No maxillary sinus tenderness or frontal sinus tenderness.     Mouth/Throat:     Mouth: Mucous membranes are moist.     Dentition: Abnormal dentition. Dental tenderness, dental caries and dental abscesses present.     Pharynx: Oropharynx is clear. Uvula midline. Posterior oropharyngeal erythema present. No oropharyngeal exudate.     Tonsils: No tonsillar exudate. 0 on the right. 0 on the left.     Comments: Smooth erythema posterior pharynx Poor dentician. Erythema swelling and tenderness surrounding R lower wisdom tooth.  Cardiovascular:     Rate and Rhythm: Normal rate and regular rhythm.     Heart sounds: Normal heart sounds.  Pulmonary:     Breath sounds: Normal breath sounds and air entry. No wheezing, rhonchi or rales.  Chest:     Chest wall: No tenderness.  Abdominal:     General: Abdomen is flat. Bowel sounds are normal.     Tenderness: There is no abdominal tenderness. There is no guarding or rebound.  Lymphadenopathy:     Cervical: No cervical adenopathy.  Neurological:     General: No focal deficit present.     Mental Status: Elizabeth David is alert and oriented to person, place, and time.  Psychiatric:        Attention and Perception: Attention and perception normal.        Mood and Affect: Mood and affect normal.        Behavior: Behavior normal. Behavior is cooperative.        Thought Content: Thought content normal.        Judgment: Judgment normal.      UC Treatments /  Results  Labs (all labs ordered are listed, but only abnormal results are displayed) Labs Reviewed  SARS CORONAVIRUS 2 (TAT 6-24 HRS)    EKG   Radiology No results found.  Procedures Procedures (including critical care time)  Medications Ordered in UC Medications - No data to display  Initial Impression / Assessment and Plan / UC Course  I have reviewed the triage vital signs and the nursing notes.  Pertinent labs & imaging results that were available during my care of the patient were reviewed by me and considered in my medical decision making (see chart for details).      This patient is a 30 year old female presenting with viral URI symptoms. History asthma that is currently well controlled on no inhalers. Today Elizabeth David is  afebrile nontachycardic nontachypneic, oxygenating well on room air, no wheezes, rhonchi, rales.  For dental infection, Augmentin sent as below. For sinus pressure and right ear effusion, prednisone as below.  Elizabeth David is not a diabetic. For allergic rhinitis, continue the Zyrtec and Flonase. For cough, send Tessalon and Promethazine DM as below. Covid test sent.  Isolation as per CDC guidelines.  Return precautions discussed.  This chart was dictated using voice recognition software, Dragon. Despite the best efforts of this provider to proofread and correct errors, errors may still occur which can change documentation meaning.   Final Clinical Impressions(s) / UC Diagnoses   Final diagnoses:  Viral URI with cough  Encounter for screening for COVID-19  Seasonal allergic rhinitis due to pollen  History of asthma  Dental infection  History of tonsillectomy     Discharge Instructions     -  Start the antibiotic- Augmentin (amoxicillin-clavulanate) twice daily for 7 days.  You can take this with or without food.  This will treat any dental infection, and help prevent sinus infection, ear infection. -Start the steroid-prednisone (Deltasone) 1 pill daily  for 5 days.  This can give you energy, so try to take it in the morning. -Tessalon as needed for cough. Take one pill up to 3x daily (every 8 hours) -Promethazine DM cough syrup for congestion/cough. This could make you drowsy, so take at night before bed. -The Covid test should come back later today or tomorrow. -With any virus, you are contagious for about 5 days after the onset of symptoms.    ED Prescriptions    Medication Sig Dispense Auth. Provider   promethazine-dextromethorphan (PROMETHAZINE-DM) 6.25-15 MG/5ML syrup Take 5 mLs by mouth 4 (four) times daily as needed for cough. 118 mL Hazel Sams, PA-C   benzonatate (TESSALON) 100 MG capsule Take 1 capsule (100 mg total) by mouth every 8 (eight) hours. 21 capsule Hazel Sams, PA-C   amoxicillin-clavulanate (AUGMENTIN) 875-125 MG tablet Take 1 tablet by mouth every 12 (twelve) hours. 14 tablet Hazel Sams, PA-C   predniSONE (DELTASONE) 20 MG tablet Take 1 tablet (20 mg total) by mouth daily for 5 days. 5 tablet Hazel Sams, PA-C     PDMP not reviewed this encounter.   Hazel Sams, PA-C 02/22/21 [phone]

## 2021-04-11 ENCOUNTER — Other Ambulatory Visit: Payer: Self-pay

## 2021-04-11 ENCOUNTER — Ambulatory Visit (HOSPITAL_COMMUNITY)
Admission: EM | Admit: 2021-04-11 | Discharge: 2021-04-11 | Disposition: A | Discharge status: Home / Self Care | Payer: Medicaid Other | Attending: Family Medicine | Admitting: Family Medicine

## 2021-04-11 ENCOUNTER — Encounter (HOSPITAL_COMMUNITY): Payer: Self-pay

## 2021-04-11 DIAGNOSIS — R220 Localized swelling, mass and lump, head: Secondary | ICD-10-CM | POA: Diagnosis not present

## 2021-04-11 DIAGNOSIS — R519 Headache, unspecified: Secondary | ICD-10-CM | POA: Diagnosis not present

## 2021-04-11 MED ORDER — CEPHALEXIN 500 MG PO CAPS: 500.0000 mg | ORAL_CAPSULE | Freq: Two times a day (BID) | ORAL | 0 refills | Status: DC

## 2021-04-11 NOTE — ED Provider Notes (Signed)
[place]    CSN: [id number] Arrival date & time: 04/11/21  0809      History   Chief Complaint Chief Complaint  Patient presents with  . neck problem    HPI Elizabeth David is a 30 y.o. female.   Patient presenting today with 4-day history of a painful, swollen area at the posterior right lateral hairline of her scalp.  She states this is happened once before in the past and resolved spontaneously without intervention.  She denies injury to the area, drainage, heat, new her products or hair styles tried.  Not trying anything over-the-counter for symptoms.     Past Medical History:  Diagnosis Date  . Active labor 12/04/2014  . Alcohol abuse   . Anemia   . Asthma    as child  . Genital herpes   . SVD (spontaneous vaginal delivery) 12/04/2014    Patient Active Problem List   Diagnosis Date Noted  . Substance induced mood disorder ([facility]) 10/24/2017  . Active labor 12/04/2014  . SVD (spontaneous vaginal delivery) 12/04/2014  . VAGINAL DISCHARGE 06/03/2010  . FREQUENCY, URINARY 06/03/2010  . BACK STRAIN, LUMBAR 06/03/2010  . MORBID OBESITY 11/03/2009  . VITAMIN B12 DEFICIENCY 05/14/2009  . FATIGUE 05/06/2009  . DYSURIA 05/06/2009  . PAIN IN JOINT, ANKLE AND FOOT 08/13/2008    Past Surgical History:  Procedure Laterality Date  . TONSILLECTOMY     at 30 years of age    68 History    Gravida  94   Para  2   Term  1   Preterm  1   AB  1   Living  2     SAB  1   IAB      Ectopic  0   Multiple  0   Live Births  2            Home Medications    Prior to Admission medications   Medication Sig Start Date End Date Taking? Authorizing Provider  cephALEXin (KEFLEX) 500 MG capsule Take 1 capsule (500 mg total) by mouth 2 (two) times daily. 04/11/21  Yes Volney American, PA-C  acetaminophen (TYLENOL) 500 MG tablet Take 1 tablet (500 mg total) by mouth every 6 (six) hours as needed. 03/29/20   Chase Picket, MD   amoxicillin-clavulanate (AUGMENTIN) 875-125 MG tablet Take 1 tablet by mouth every 12 (twelve) hours. 02/22/21   Hazel Sams, PA-C  benzonatate (TESSALON) 100 MG capsule Take 1 capsule (100 mg total) by mouth every 8 (eight) hours. 02/22/21   Hazel Sams, PA-C  dicyclomine (BENTYL) 20 MG tablet Take 1 tablet (20 mg total) by mouth 2 (two) times daily as needed for spasms. 02/02/20   Quintella Reichert, MD  ibuprofen (ADVIL) 600 MG tablet Take 1 tablet (600 mg total) by mouth every 6 (six) hours as needed. 03/29/20   Chase Picket, MD  ondansetron (ZOFRAN ODT) 4 MG disintegrating tablet Take 1 tablet (4 mg total) by mouth every 8 (eight) hours as needed for nausea or vomiting. 02/02/20   Quintella Reichert, MD  promethazine-dextromethorphan (PROMETHAZINE-DM) 6.25-15 MG/5ML syrup Take 5 mLs by mouth 4 (four) times daily as needed for cough. 02/22/21   Hazel Sams, PA-C  sulfamethoxazole-trimethoprim (BACTRIM) 400-80 MG tablet Take 1 tablet by mouth 2 (two) times daily. 10/20/20   Fransico Meadow, PA-C    Family History Family History  Problem Relation Age of Onset  . Hypertension Mother   .  Diabetes Mother   . Heart failure Other     Social History Social History   Tobacco Use  . Smoking status: Former Smoker    Packs/day: 0.00    Years: 3.00    Pack years: 0.00    Types: Cigarettes    Quit date: 06/16/2012    Years since quitting: 8.8  . Smokeless tobacco: Never Used  Vaping Use  . Vaping Use: Never used  Substance Use Topics  . Alcohol use: Yes    Alcohol/week: 7.0 standard drinks    Types: 7 Standard drinks or equivalent per week    Comment: per week  . Drug use: Yes    Types: Marijuana    Comment: daily     Allergies   Patient has no known allergies.   Review of Systems Review of Systems Per HPI  Physical Exam Triage Vital Signs ED Triage Vitals  Enc Vitals Group     BP 04/11/21 0832 137/82     Pulse Rate 04/11/21 0832 89     Resp 04/11/21 0832 17      Temp 04/11/21 0832 98.6 F (37 C)     Temp Source 04/11/21 0832 Oral     SpO2 04/11/21 0832 99 %     Weight --      Height --      Head Circumference --      Peak Flow --      Pain Score 04/11/21 0831 8     Pain Loc --      Pain Edu? --      Excl. in [place]? --    No data found.  Updated Vital Signs BP 137/82 (BP Location: Right Arm)   Pulse 89   Temp 98.6 F (37 C) (Oral)   Resp 17   LMP 04/03/2021   SpO2 99%   Visual Acuity Right Eye Distance:   Left Eye Distance:   Bilateral Distance:    Right Eye Near:   Left Eye Near:    Bilateral Near:     Physical Exam Vitals and nursing note reviewed.  Constitutional:      Appearance: Normal appearance. She is not ill-appearing.  HENT:     Head: Atraumatic.     Mouth/Throat:     Mouth: Mucous membranes are moist.     Pharynx: Oropharynx is clear. No posterior oropharyngeal erythema.  Eyes:     Extraocular Movements: Extraocular movements intact.     Conjunctiva/sclera: Conjunctivae normal.  Cardiovascular:     Rate and Rhythm: Normal rate and regular rhythm.     Heart sounds: Normal heart sounds.  Pulmonary:     Effort: Pulmonary effort is normal.     Breath sounds: Normal breath sounds.  Abdominal:     General: Bowel sounds are normal. There is no distension.     Palpations: Abdomen is soft.     Tenderness: There is no abdominal tenderness. There is no guarding.  Musculoskeletal:        General: Normal range of motion.     Cervical back: Normal range of motion and neck supple.  Skin:    General: Skin is warm and dry.     Comments: Area of palpable edema right lateral posterior scalp at base of hairline, nonfluctuant, not erythematous, no active drainage or skin changes  Neurological:     Mental Status: She is alert and oriented to person, place, and time.  Psychiatric:        Mood and Affect: Mood  normal.        Thought Content: Thought content normal.        Judgment: Judgment normal.    UC Treatments /  Results  Labs (all labs ordered are listed, but only abnormal results are displayed) Labs Reviewed - No data to display  EKG  Radiology No results found.  Procedures Procedures (including critical care time)  Medications Ordered in UC Medications - No data to display  Initial Impression / Assessment and Plan / UC Course  I have reviewed the triage vital signs and the nursing notes.  Pertinent labs & imaging results that were available during my care of the patient were reviewed by me and considered in my medical decision making (see chart for details).     Possibly early infection, though no identifiable abscess at this point and no active drainage.  Will start Keflex and warm compresses, ibuprofen as needed.  Follow-up with PCP if not fully resolving.  Final Clinical Impressions(s) / UC Diagnoses   Final diagnoses:  Pain of scalp  Mass of scalp   Discharge Instructions   None    ED Prescriptions    Medication Sig Dispense Auth. Provider   cephALEXin (KEFLEX) 500 MG capsule Take 1 capsule (500 mg total) by mouth 2 (two) times daily. 14 capsule Volney American, [place]     PDMP not reviewed this encounter.   Volney American, [place] 04/11/21 1129

## 2021-04-11 NOTE — ED Triage Notes (Signed)
Pt c/o a lump that developed on the back of her neck. She states she noticed it on Friday and states it hurts to turn her head or lay down on it.

## 2021-08-21 ENCOUNTER — Encounter (HOSPITAL_COMMUNITY): Payer: Self-pay

## 2021-08-21 ENCOUNTER — Other Ambulatory Visit: Payer: Self-pay

## 2021-08-21 ENCOUNTER — Emergency Department (HOSPITAL_COMMUNITY): Payer: Medicaid Other

## 2021-08-21 ENCOUNTER — Emergency Department (HOSPITAL_COMMUNITY)
Admission: EM | Admit: 2021-08-21 | Discharge: 2021-08-21 | Disposition: A | Discharge status: Home / Self Care | Payer: Medicaid Other | Attending: Emergency Medicine | Admitting: Emergency Medicine

## 2021-08-21 DIAGNOSIS — J45909 Unspecified asthma, uncomplicated: Secondary | ICD-10-CM | POA: Diagnosis not present

## 2021-08-21 DIAGNOSIS — Z87891 Personal history of nicotine dependence: Secondary | ICD-10-CM | POA: Diagnosis not present

## 2021-08-21 DIAGNOSIS — S93402A Sprain of unspecified ligament of left ankle, initial encounter: Secondary | ICD-10-CM | POA: Diagnosis not present

## 2021-08-21 DIAGNOSIS — X501XXA Overexertion from prolonged static or awkward postures, initial encounter: Secondary | ICD-10-CM | POA: Diagnosis not present

## 2021-08-21 DIAGNOSIS — S99912A Unspecified injury of left ankle, initial encounter: Secondary | ICD-10-CM | POA: Diagnosis present

## 2021-08-21 MED ORDER — IBUPROFEN 800 MG PO TABS
800.0000 mg | ORAL_TABLET | Freq: Once | ORAL | Status: AC
  Administered 2021-08-21: 800 mg via ORAL
  Filled 2021-08-21: qty 1

## 2021-08-21 NOTE — ED Notes (Signed)
Ace wrap applied to left ankle, cms intact. Crutches provided to patient and return demonstration

## 2021-08-21 NOTE — ED Provider Notes (Signed)
[place] DEPT Provider Note   CSN: [id number] Arrival date & time: 08/21/21  [id number]     History Chief Complaint  Patient presents with   Ankle Pain    Elizabeth David is a 30 y.o. female.   Ankle Pain Associated symptoms: no back pain and no fever    30 year old female presenting to the emergency department with ankle pain.  She states that earlier this morning she missed a step and twisted her ankle.  She felt a "pop" and has been having difficulty bearing weight on the ankle ever since.  She endorses pain in the bilateral malleoli.  The pain is sharp and shooting and nonradiating.  She denies any other injuries or trauma.  Past Medical History:  Diagnosis Date   Active labor 12/04/2014   Alcohol abuse    Anemia    Asthma    as child   Genital herpes    SVD (spontaneous vaginal delivery) 12/04/2014    Patient Active Problem List   Diagnosis Date Noted   Substance induced mood disorder ([employer]) 10/24/2017   Active labor 12/04/2014   SVD (spontaneous vaginal delivery) 12/04/2014   VAGINAL DISCHARGE 06/03/2010   FREQUENCY, URINARY 06/03/2010   BACK STRAIN, LUMBAR 06/03/2010   MORBID OBESITY 11/03/2009   VITAMIN B12 DEFICIENCY 05/14/2009   FATIGUE 05/06/2009   DYSURIA 05/06/2009   PAIN IN JOINT, ANKLE AND FOOT 08/13/2008    Past Surgical History:  Procedure Laterality Date   TONSILLECTOMY     at 30 years of age     71 History     Gravida  29   Para  2   Term  1   Preterm  1   AB  1   Living  2      SAB  1   IAB      Ectopic  0   Multiple  0   Live Births  2           Family History  Problem Relation Age of Onset   Hypertension Mother    Diabetes Mother    Heart failure Other     Social History   Tobacco Use   Smoking status: Former    Packs/day: 0.00    Years: 3.00    Pack years: 0.00    Types: Cigarettes    Quit date: 06/16/2012    Years since quitting: 9.1   Smokeless tobacco: Never  Vaping  Use   Vaping Use: Never used  Substance Use Topics   Alcohol use: Yes    Alcohol/week: 7.0 standard drinks    Types: 7 Standard drinks or equivalent per week    Comment: per week   Drug use: Yes    Types: Marijuana    Comment: daily    Home Medications Prior to Admission medications   Medication Sig Start Date End Date Taking? Authorizing Provider  acetaminophen (TYLENOL) 500 MG tablet Take 1 tablet (500 mg total) by mouth every 6 (six) hours as needed. 03/29/20   Chase Picket, MD  amoxicillin-clavulanate (AUGMENTIN) 875-125 MG tablet Take 1 tablet by mouth every 12 (twelve) hours. 02/22/21   Hazel Sams, PA-C  benzonatate (TESSALON) 100 MG capsule Take 1 capsule (100 mg total) by mouth every 8 (eight) hours. 02/22/21   Hazel Sams, PA-C  cephALEXin (KEFLEX) 500 MG capsule Take 1 capsule (500 mg total) by mouth 2 (two) times daily. 04/11/21   Volney American, PA-C  dicyclomine (BENTYL)  20 MG tablet Take 1 tablet (20 mg total) by mouth 2 (two) times daily as needed for spasms. 02/02/20   Quintella Reichert, MD  ibuprofen (ADVIL) 600 MG tablet Take 1 tablet (600 mg total) by mouth every 6 (six) hours as needed. 03/29/20   Chase Picket, MD  ondansetron (ZOFRAN ODT) 4 MG disintegrating tablet Take 1 tablet (4 mg total) by mouth every 8 (eight) hours as needed for nausea or vomiting. 02/02/20   Quintella Reichert, MD  promethazine-dextromethorphan (PROMETHAZINE-DM) 6.25-15 MG/5ML syrup Take 5 mLs by mouth 4 (four) times daily as needed for cough. 02/22/21   Hazel Sams, PA-C  sulfamethoxazole-trimethoprim (BACTRIM) 400-80 MG tablet Take 1 tablet by mouth 2 (two) times daily. 10/20/20   Fransico Meadow, PA-C    Allergies    Patient has no known allergies.  Review of Systems   Review of Systems  Constitutional:  Negative for chills and fever.  HENT:  Negative for ear pain and sore throat.   Eyes:  Negative for pain and visual disturbance.  Respiratory:  Negative for cough and  shortness of breath.   Cardiovascular:  Negative for chest pain and palpitations.  Gastrointestinal:  Negative for abdominal pain and vomiting.  Genitourinary:  Negative for dysuria and hematuria.  Musculoskeletal:  Positive for arthralgias. Negative for back pain.  Skin:  Negative for color change and rash.  Neurological:  Negative for seizures and syncope.  All other systems reviewed and are negative.  Physical Exam Updated Vital Signs BP (!) 152/91   Pulse 98   Temp 98.5 F (36.9 C) (Oral)   Resp 19   LMP 07/22/2021   SpO2 99%   Physical Exam Vitals and nursing note reviewed.  Constitutional:      General: She is not in acute distress.    Appearance: She is well-developed.  HENT:     Head: Normocephalic and atraumatic.  Eyes:     Conjunctiva/sclera: Conjunctivae normal.  Cardiovascular:     Rate and Rhythm: Normal rate and regular rhythm.     Heart sounds: No murmur heard. Pulmonary:     Effort: Pulmonary effort is normal. No respiratory distress.     Breath sounds: Normal breath sounds.  Abdominal:     Palpations: Abdomen is soft.     Tenderness: There is no abdominal tenderness.  Musculoskeletal:        General: Tenderness present. No deformity.     Cervical back: Neck supple.     Comments: Tenderness palpation of the medial and lateral malleolus.  2+ DP pulses.  Skin:    General: Skin is warm and dry.  Neurological:     Mental Status: She is alert.     Sensory: No sensory deficit.    ED Results / Procedures / Treatments   Labs (all labs ordered are listed, but only abnormal results are displayed) Labs Reviewed - No data to display  EKG None  Radiology DG Ankle Complete Left  Result Date: 08/21/2021 CLINICAL DATA:  Injury to the left ankle with pain. EXAM: LEFT ANKLE COMPLETE - 3+ VIEW COMPARISON:  October 20, 2020 left foot film FINDINGS: There is no evidence of fracture, dislocation, or joint effusion. There is no evidence of arthropathy or other  focal bone abnormality. Minimal soft tissue swelling over lateral left ankle is identified. IMPRESSION: No acute fracture or dislocation. Minimal soft tissue swelling over lateral left ankle is identified. Electronically Signed   By: Abelardo Diesel M.D.   On: 08/21/2021  08:20    Procedures Procedures   Medications Ordered in ED Medications  ibuprofen (ADVIL) tablet 800 mg (800 mg Oral Given 08/21/21 [id number])    ED Course  I have reviewed the triage vital signs and the nursing notes.  Pertinent labs & imaging results that were available during my care of the patient were reviewed by me and considered in my medical decision making (see chart for details).    MDM Rules/Calculators/A&P                           30 year old female [facility] with left ankle pain after twisting it earlier this morning.  No other injuries per the patient.  On exam, the patient had tenderness palpation of the medial and lateral malleoli.  2+ DP pulses and intact sensation to light touch.  The patient has been unable to bear weight since the pain.  Given these findings, we will go ahead and order x-ray imaging and reassess.  Motrin provided for pain control.  X-ray imaging was performed revealed no acute fracture or dislocation.  Minimal soft tissue swelling over the lateral left ankle was identified.  Symptoms are consistent with likely ankle sprain.  The patient was advised weightbearing as tolerated, provided crutches, the ankle was wrapped in an Ace wrap and she was advised NSAIDs for pain control.  Additionally advised rest, ice, elevation of the affected extremity. Final Clinical Impression(s) / ED Diagnoses Final diagnoses:  Sprain of left ankle, unspecified ligament, initial encounter    Rx / DC Orders ED Discharge Orders     None        Regan Lemming, MD 08/21/21 0825

## 2021-08-21 NOTE — ED Triage Notes (Signed)
Pt c/o left ankle pain. States she missed a step and twisted it this morning

## 2021-08-21 NOTE — Discharge Instructions (Addendum)
Your x-ray imaging was negative for acute fracture.  You likely have an ankle sprain and/or ligamentous injury.  Recommend crutches, weightbearing as tolerated, rest, ice the ankle, elevate the ankle, NSAIDs like ibuprofen for pain control.  Recommend follow-up with your PCP for reassessment once swelling improves

## 2021-09-08 ENCOUNTER — Other Ambulatory Visit: Payer: Self-pay

## 2021-09-08 ENCOUNTER — Encounter: Payer: Self-pay | Admitting: Physical Therapy

## 2021-09-08 ENCOUNTER — Ambulatory Visit: Payer: Medicaid Other | Attending: Family Medicine | Admitting: Physical Therapy

## 2021-09-08 DIAGNOSIS — M25672 Stiffness of left ankle, not elsewhere classified: Secondary | ICD-10-CM | POA: Diagnosis present

## 2021-09-08 DIAGNOSIS — R600 Localized edema: Secondary | ICD-10-CM | POA: Diagnosis present

## 2021-09-08 DIAGNOSIS — R2689 Other abnormalities of gait and mobility: Secondary | ICD-10-CM | POA: Insufficient documentation

## 2021-09-08 DIAGNOSIS — M25572 Pain in left ankle and joints of left foot: Secondary | ICD-10-CM | POA: Diagnosis present

## 2021-09-08 NOTE — Patient Instructions (Signed)
Access Code: [email or web address] URL: [email or web address] Date: 09/08/2021 Prepared by: Almyra Free  Exercises Seated Ankle Circles - 3 x daily - 7 x weekly - 1 sets - 10 reps Seated Ankle Alphabet - 3 x daily - 7 x weekly - 1 sets - 5 reps Seated Heel Raise - 3 x daily - 7 x weekly - 1 sets - 10 reps Seated Toe Raise - 3 x daily - 7 x weekly - 1 sets - 10 reps Seated Ankle Inversion Eversion PROM - 1 x daily - 7 x weekly - 3 sets - 10 reps Seated Ankle Plantarflexion Dorsiflexion PROM - 1 x daily - 7 x weekly - 3 sets - 10 reps Seated Ankle Pumps - 1 x daily - 7 x weekly - 3 sets - 10 reps - 3-5 sec hold

## 2021-09-08 NOTE — Therapy (Signed)
[facility] [address], [place], [place], [address] Phone: [phone]   Fax:  [phone]  Physical Therapy Evaluation  Patient Details  Name: Elizabeth David MRN: [id number] Date of Birth: Apr 16, 1991 Referring Provider (PT): Rhina Brackett MD   Encounter Date: 09/08/2021   PT End of Session - 09/08/21 1448     Visit Number 1    Number of Visits 4    Date for PT Re-Evaluation 10/21/21    Authorization Type [place] [employer] [place] Access    PT Start Time 1449    PT Stop Time 1525    PT Time Calculation (min) 36 min    Activity Tolerance Patient tolerated treatment well    Behavior During Therapy [facility] for tasks assessed/performed             Past Medical History:  Diagnosis Date   Active labor 12/04/2014   Alcohol abuse    Anemia    Asthma    as child   Genital herpes    SVD (spontaneous vaginal delivery) 12/04/2014    Past Surgical History:  Procedure Laterality Date   TONSILLECTOMY     at 30 years of age    There were no vitals filed for this visit.    Subjective Assessment - 09/08/21 1454     Subjective 08/29/21 Patient missed last step coming down from her porch and rolled her ankle. She has been in a CAM boot since 08/30/21. Walking without AD at home and one cane in community and at work at [employer].    Diagnostic tests xrays - neg    Patient Stated Goals get walking asap    Currently in Pain? Yes    Pain Score 3     Pain Location Ankle    Pain Orientation Left;Medial    Pain Descriptors / Indicators Tightness;Pressure;Throbbing    Pain Type Acute pain    Pain Radiating Towards sometimes up leg when sitting    Pain Onset 1 to 4 weeks ago    Pain Frequency Constant    Aggravating Factors  weightbearing    Pain Relieving Factors intermittent icing, elevates it when she sleeps    Effect of Pain on Daily Activities walks for work                [facility] PT Assessment - 09/08/21 0001        Assessment   Medical Diagnosis left ankle sprain    Referring Provider (PT) Rhina Brackett MD    Onset Date/Surgical Date 08/29/21    Hand Dominance Right    Next MD Visit next week      Precautions   Precautions None    Required Braces or Orthoses Other Brace/Splint    Other Brace/Splint CAM boot      Restrictions   Weight Bearing Restrictions No      Balance Screen   Has the patient fallen in the past 6 months Yes    How many times? 1    Has the patient had a decrease in activity level because of a fear of falling?  No    Is the patient reluctant to leave their home because of a fear of falling?  No      Home Environment   Living Environment Private residence    Living Arrangements Children    Additional Comments 7 steps one rail      Observation/Other Assessments-Edema    Edema Figure 8  Figure 8 Edema   Figure 8 - Right  51.75 cm    Figure 8 - Left  52 cm      ROM / Strength   AROM / PROM / Strength AROM;PROM;Strength      AROM   AROM Assessment Site Ankle    Right/Left Ankle Left    Left Ankle Dorsiflexion -10    Left Ankle Plantar Flexion 43    Left Ankle Inversion 12    Left Ankle Eversion 12      PROM   PROM Assessment Site Ankle    Right/Left Ankle Left    Left Ankle Dorsiflexion 2    Left Ankle Plantar Flexion 65    Left Ankle Inversion 20    Left Ankle Eversion 24      Strength   Overall Strength Comments limited by pain    Strength Assessment Site Ankle    Right/Left Ankle Left    Left Ankle Dorsiflexion 4+/5    Left Ankle Plantar Flexion 2+/5    Left Ankle Inversion 2+/5    Left Ankle Eversion 3+/5      Palpation   Palpation comment marked tenderness of left ant talofib and post calcfib ligaments      Special Tests   Other special tests decreased talocrural and calcaneal movement      Ambulation/Gait   Ambulation/Gait Yes    Ambulation/Gait Assistance 6: Modified independent (Device/Increase time)    Ambulation Distance  (Feet) 30 Feet    Assistive device R Axillary Crutch    Gait Pattern Within Functional Limits   with CAM Boot   Ambulation Surface Level    Gait Comments patient able to amb without crutch as well with normal deviations due to CAM boot                        Objective measurements completed on examination: See above findings.                PT Education - 09/08/21 1711     Education Details ROM HEP    Person(s) Educated Patient    Methods Explanation;Demonstration;Handout    Comprehension Verbalized understanding;Returned demonstration              PT Short Term Goals - 09/08/21 1723       PT SHORT TERM GOAL #1   Title Ind with initial HEP    Baseline no HEP    Time 2    Period Weeks    Status New               PT Long Term Goals - 09/08/21 1723       PT LONG TERM GOAL #1   Title Ind with advanced HEP    Baseline no HEP    Status New      PT LONG TERM GOAL #2   Title Improved left ankle ROM to [facility] to normalize gait and allow patient to squat down to perform ADLs    Baseline DF -10 deg, PF 43 deg, EV/Inv 12 deg    Status New      PT LONG TERM GOAL #3   Title Improved left ankle strength to 4+/5 or better    Baseline left ankle strength 2+ to 4+/5    Status New      PT LONG TERM GOAL #4   Title Patient able to safely amb community distances without AD and > 75% reduction in pain  Baseline amb with CAM Boot and right axillary crutch; pain = 5/10    Status New      PT LONG TERM GOAL #5   Title Patient able to climb stairs with a reciprocal gait pattern    Baseline step to gait on steps using rail and axillary crutch    Status New                    Plan - 09/08/21 1527     Clinical Impression Statement Patient presents s/p left lateral ankle sprain on 08/29/21 when she missed a step coming off her porch. She is presently in a CAM boot and uses one crutch in the community and at work at [employer]. 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 She does not use the crutch at home. She has limited ROM and strength in all planes and mild edema compared to right ankle. She has decreased mobility at the talocrural joint and talocalcaneal joint and pain with PA mobs of distal tibfib. She has been doing limited ROM exercises due to her work schedule and she also has two kids at home to care for. She has seven steps to enter her home. She will benefit from skilled PT to restore full ankle ROM and strength and normalize gait and mobility.    Stability/Clinical Decision Making Stable/Uncomplicated    Clinical Decision Making Low    Rehab Potential Excellent    PT Frequency 1x / week    PT Duration 4 weeks    PT Treatment/Interventions ADLs/Self Care Home Management;Aquatic Therapy;Cryotherapy;Electrical Stimulation;Iontophoresis 4mg /ml Dexamethasone;Moist Heat;Neuromuscular re-education;Balance training;Therapeutic exercise;Therapeutic activities;Stair training;Gait training;Functional mobility training;Patient/family education;Manual techniques;Dry needling;Passive range of motion;Taping;Vasopneumatic Device;Joint Manipulations    PT Next Visit Plan left ankle ROM, strength, gait, balance    PT Home Exercise Plan [email or web address]    Consulted and Agree with Plan of Care Patient             Patient will benefit from skilled therapeutic intervention in order to improve the following deficits and impairments:  Abnormal gait, Decreased range of motion, Pain, Decreased balance, Hypomobility, Increased edema, Decreased strength, Difficulty walking  Visit Diagnosis: Stiffness of left ankle, not elsewhere classified  Pain in left ankle and joints of left foot  Localized edema  Other abnormalities of gait and mobility     Problem List Patient Active Problem List   Diagnosis Date Noted   Substance induced mood disorder ([place]) 10/24/2017   Active labor 12/04/2014    SVD (spontaneous vaginal delivery) 12/04/2014   VAGINAL DISCHARGE 06/03/2010   FREQUENCY, URINARY 06/03/2010   BACK STRAIN, LUMBAR 06/03/2010   MORBID OBESITY 11/03/2009   VITAMIN B12 DEFICIENCY 05/14/2009   FATIGUE 05/06/2009   DYSURIA 05/06/2009   PAIN IN JOINT, ANKLE AND FOOT 08/13/2008   Madelyn Flavors, PT 09/08/2021, 5:29 PM  [facility] [address] [place], [place], [address] Phone: [phone]   Fax:  [phone]  Name: Elizabeth David MRN: [id number] Date of Birth: 1991-04-04

## 2021-09-23 ENCOUNTER — Ambulatory Visit: Payer: Medicaid Other | Admitting: Rehabilitative and Restorative Service Providers"

## 2021-09-27 ENCOUNTER — Ambulatory Visit: Payer: Medicaid Other | Attending: Family Medicine | Admitting: Rehabilitative and Restorative Service Providers"

## 2021-09-27 ENCOUNTER — Other Ambulatory Visit: Payer: Self-pay

## 2021-09-27 ENCOUNTER — Encounter: Payer: Self-pay | Admitting: Rehabilitative and Restorative Service Providers"

## 2021-09-27 DIAGNOSIS — R600 Localized edema: Secondary | ICD-10-CM

## 2021-09-27 DIAGNOSIS — M25672 Stiffness of left ankle, not elsewhere classified: Secondary | ICD-10-CM

## 2021-09-27 DIAGNOSIS — M25572 Pain in left ankle and joints of left foot: Secondary | ICD-10-CM

## 2021-09-27 DIAGNOSIS — R2689 Other abnormalities of gait and mobility: Secondary | ICD-10-CM | POA: Diagnosis present

## 2021-09-27 NOTE — Therapy (Signed)
[facility] @ [place], [place], [address] Phone: [phone]   Fax:  [phone]  Physical Therapy Treatment  Patient Details  Name: Elizabeth David MRN: [id number] Date of Birth: [birth year] Referring Provider (PT): Elizabeth Brackett MD   Encounter Date: 09/27/2021   PT End of Session - 09/27/21 1445     Visit Number 2    Number of Visits 4    Date for PT Re-Evaluation 10/21/21    Authorization Type [place] [employer]CD [place] - Visit Number 1    Authorization - Number of Visits 3    PT Start Time 1440    Activity Tolerance Patient tolerated treatment well    Behavior During Therapy [facility] for tasks assessed/performed             Past Medical History:  Diagnosis Date   Active labor 12/04/2014   Alcohol abuse    Anemia    Asthma    as child   Genital herpes    SVD (spontaneous vaginal delivery) 12/04/2014    Past Surgical History:  Procedure Laterality Date   TONSILLECTOMY     at 30 years of age    There were no vitals filed for this visit.   Subjective Assessment - 09/27/21 1443     Subjective Pt reports that the MD gave her a new, less restrictive brace following her appointment with PT, but she states that she is having more pain with the new brace and more swelling.  Pt is doing okay at home, but has increased pain at work.    Patient Stated Goals get walking asap    Currently in Pain? Yes    Pain Score 6     Pain Location Ankle    Pain Orientation Left;Medial    Pain Descriptors / Indicators Aching;Nagging    Pain Type Acute pain                               [facility] Adult PT Treatment/Exercise - 09/27/21 0001       Exercises   Exercises Ankle      Manual Therapy   Manual Therapy Soft tissue mobilization;Joint mobilization;Passive ROM    Manual therapy comments in sitting to L ankle    Joint Mobilization gentle grade 2/3 to L ankle to  facilitate ROM    Soft tissue mobilization STM to L ankle to decrease edema    Passive ROM PROM to L ankle in all planes of motion      Ankle Exercises: Stretches   Soleus Stretch 3 reps;20 seconds    Gastroc Stretch 3 reps;20 seconds      Ankle Exercises: Seated   Ankle Circles/Pumps AROM;Left;10 reps    Ankle Circles/Pumps Limitations inv/ever, CW and CCW circles    Towel Crunch Limitations   2x10   Heel Raises Left;20 reps    Toe Raise 20 reps    Other Seated Ankle Exercises 4 way strengthening with yellow tband 2x5 each                       PT Short Term Goals - 09/27/21 1541       PT SHORT TERM GOAL #1   Title Ind with initial HEP    Status Achieved               PT Long Term  Goals - 09/27/21 1541       PT LONG TERM GOAL #1   Title Ind with advanced HEP    Status On-going      PT LONG TERM GOAL #2   Title Improved left ankle ROM to [facility] to normalize gait and allow patient to squat down to perform ADLs    Status On-going      PT LONG TERM GOAL #3   Title Improved left ankle strength to 4+/5 or better    Status On-going      PT LONG TERM GOAL #4   Title Patient able to safely amb community distances without AD and > 75% reduction in pain    Status On-going      PT LONG TERM GOAL #5   Title Patient able to climb stairs with a reciprocal gait pattern    Status On-going                   Plan - 09/27/21 1538     Clinical Impression Statement Elizabeth David is continuing to have increased L ankle pain and decreased ROM/strength. Pt admits to not wearing less restrictive brace as often as CAM walker secondary to having increased pain and edema when wearing new ankle brace. Educated pt to wear new ankle brace and standard shoe to her PT sessions to allow PT to properly gait train and perform exercises to facilitate proper  body mechanics and techniques with HEP and use of brace. Pt verbalizes her understanding. Pt reported decreased pain and  feeling 'looser' after manual therapy, then further reported increased relief of pain following toe marble pick up and placing into a cup.    PT Treatment/Interventions ADLs/Self Care Home Management;Aquatic Therapy;Cryotherapy;Electrical Stimulation;Iontophoresis 4mg /ml Dexamethasone;Moist Heat;Neuromuscular re-education;Balance training;Therapeutic exercise;Therapeutic activities;Stair training;Gait training;Functional mobility training;Patient/family education;Manual techniques;Dry needling;Passive range of motion;Taping;Vasopneumatic Device;Joint Manipulations    PT Next Visit Plan left ankle ROM, strength, gait, balance    PT Home Exercise Plan [email or web address] added theraband exercises for L ankle.    Consulted and Agree with Plan of Care Patient             Patient will benefit from skilled therapeutic intervention in order to improve the following deficits and impairments:  Abnormal gait, Decreased range of motion, Pain, Decreased balance, Hypomobility, Increased edema, Decreased strength, Difficulty walking  Visit Diagnosis: Stiffness of left ankle, not elsewhere classified  Pain in left ankle and joints of left foot  Localized edema  Other abnormalities of gait and mobility     Problem List Patient Active Problem List   Diagnosis Date Noted   Substance induced mood disorder ([place]) 10/24/2017   Active labor 12/04/2014   SVD (spontaneous vaginal delivery) 12/04/2014   VAGINAL DISCHARGE 06/03/2010   FREQUENCY, URINARY 06/03/2010   BACK STRAIN, LUMBAR 06/03/2010   MORBID OBESITY 11/03/2009   VITAMIN B12 DEFICIENCY 05/14/2009   FATIGUE 05/06/2009   DYSURIA 05/06/2009   PAIN IN JOINT, ANKLE AND FOOT 08/13/2008    Elizabeth David, PT, DPT 09/27/2021, 3:43 PM  [facility] @ [place], [place], [address] Phone: [phone]   Fax:  [phone]  Name: Elizabeth David MRN: [id number] Date of Birth:  Nov 15, 1991

## 2021-10-07 ENCOUNTER — Other Ambulatory Visit: Payer: Self-pay

## 2021-10-07 ENCOUNTER — Encounter: Payer: Self-pay | Admitting: Rehabilitative and Restorative Service Providers"

## 2021-10-07 ENCOUNTER — Ambulatory Visit: Payer: Medicaid Other | Admitting: Rehabilitative and Restorative Service Providers"

## 2021-10-07 DIAGNOSIS — M25672 Stiffness of left ankle, not elsewhere classified: Secondary | ICD-10-CM | POA: Diagnosis not present

## 2021-10-07 DIAGNOSIS — M25572 Pain in left ankle and joints of left foot: Secondary | ICD-10-CM

## 2021-10-07 DIAGNOSIS — R2689 Other abnormalities of gait and mobility: Secondary | ICD-10-CM

## 2021-10-07 NOTE — Therapy (Addendum)
[facility] @ [place], [place], [address] Phone: [phone]   Fax:  [phone]  Physical Therapy Treatment  Patient Details  Name: Elizabeth David MRN: [id number] Date of Birth: [birth year] Referring Provider (PT): Rhina Brackett MD   Encounter Date: 10/07/2021   PT End of Session - 10/07/21 0935     Visit Number 3    Number of Visits 4    Date for PT Re-Evaluation 10/21/21    Authorization Type [place] MCD [place] - Visit Number 2    Authorization - Number of Visits 3    PT Start Time 0930    PT Stop Time 1010    PT Time Calculation (min) 40 min    Activity Tolerance Patient tolerated treatment well    Behavior During Therapy [facility] for tasks assessed/performed             Past Medical History:  Diagnosis Date   Active labor 12/04/2014   Alcohol abuse    Anemia    Asthma    as child   Genital herpes    SVD (spontaneous vaginal delivery) 12/04/2014    Past Surgical History:  Procedure Laterality Date   TONSILLECTOMY     at 30 years of age    There were no vitals filed for this visit.   Subjective Assessment - 10/07/21 0933     Subjective Pt reports that she is trying to wear her brace more to let her body get used to it.    Currently in Pain? Yes    Pain Score 5     Pain Location Ankle    Pain Orientation Left                [facility] PT Assessment - 10/07/21 0001       AROM   Left Ankle Dorsiflexion 3    Left Ankle Plantar Flexion 50    Left Ankle Inversion 20    Left Ankle Eversion 20                           [facility] Adult PT Treatment/Exercise - 10/07/21 0001       Ambulation/Gait   Gait Comments Pt amb 400 ft without assistive device with ankle brace with good safety and equal weight bearing      Ankle Exercises: Aerobic   Recumbent Bike L2 x6 min.  PT present to provide oversight and discuss progress      Ankle Exercises: Seated    ABC's 1 rep    Ankle Circles/Pumps AROM;Left;10 reps    Ankle Circles/Pumps Limitations inv/ever, CW and CCW circles    Heel Raises Both;20 reps    Toe Raise 20 reps    Other Seated Ankle Exercises 4 way strengthening with yellow tband 2x10 each      Ankle Exercises: Stretches   Soleus Stretch 3 reps;20 seconds    Gastroc Stretch 3 reps;20 seconds      Ankle Exercises: Standing   Rocker Board 1 minute   fwd/back and R/L   Other Standing Ankle Exercises Lungest onto bosu ball x10 B    Other Standing Ankle Exercises Step ups and lateral step ups over blue foam x10B                       PT Short Term Goals - 09/27/21 1541  PT SHORT TERM GOAL #1   Title Ind with initial HEP    Status Achieved               PT Long Term Goals - 10/07/21 1019       PT LONG TERM GOAL #1   Title Ind with advanced HEP    Status Partially Met      PT LONG TERM GOAL #2   Title Improved left ankle ROM to [facility] to normalize gait and allow patient to squat down to perform ADLs    Status Achieved      PT LONG TERM GOAL #3   Title Improved left ankle strength to 4+/5 or better    Status Partially Met      PT LONG TERM GOAL #4   Title Patient able to safely amb community distances without AD and > 75% reduction in pain    Status Partially Met      PT LONG TERM GOAL #5   Title Patient able to climb stairs with a reciprocal gait pattern    Status Partially Met                   Plan - 10/07/21 1014     Clinical Impression Statement Ms Schupp is making great progress towards goal related activities.  She has improved her L ankle ROM to [facility] and is able to start attempting to negotiate steps at home with reciprocal pattern. She has some decreased pain from last session and is able to tolerate wearing of L ankle brace instead of CAM walker.  Pt states that she is able to tolerate approx 4-5 hours of work before her ankle really starts to bother her.    PT  Treatment/Interventions ADLs/Self Care Home Management;Aquatic Therapy;Cryotherapy;Electrical Stimulation;Iontophoresis 72m/ml Dexamethasone;Moist Heat;Neuromuscular re-education;Balance training;Therapeutic exercise;Therapeutic activities;Stair training;Gait training;Functional mobility training;Patient/family education;Manual techniques;Dry needling;Passive range of motion;Taping;Vasopneumatic Device;Joint Manipulations    PT Next Visit Plan left ankle ROM, strength, gait, balance    Consulted and Agree with Plan of Care Patient             Patient will benefit from skilled therapeutic intervention in order to improve the following deficits and impairments:  Abnormal gait, Decreased range of motion, Pain, Decreased balance, Hypomobility, Increased edema, Decreased strength, Difficulty walking  Visit Diagnosis: Stiffness of left ankle, not elsewhere classified  Pain in left ankle and joints of left foot  Other abnormalities of gait and mobility     Problem List Patient Active Problem List   Diagnosis Date Noted   Substance induced mood disorder ([place] 10/24/2017   Active labor 12/04/2014   SVD (spontaneous vaginal delivery) 12/04/2014   VAGINAL DISCHARGE 06/03/2010   FREQUENCY, URINARY 06/03/2010   BACK STRAIN, LUMBAR 06/03/2010   MORBID OBESITY 11/03/2009   VITAMIN B12 DEFICIENCY 05/14/2009   FATIGUE 05/06/2009   DYSURIA 05/06/2009   PAIN IN JOINT, ANKLE AND FOOT 08/13/2008   PHYSICAL THERAPY DISCHARGE SUMMARY  On 11/08/21, pt had not returned for a follow up appointment and discharged from services at this time.  Patient agrees to discharge. Patient goals were partially met. Patient is being discharged due to not returning since the last visit.   SJuel Burrow PT, DPT 10/07/2021, 10:20 AM  [email or web address]: [phone]  Fax:  [phone] Name: KROJEAN IGEMRN:  [id number] of Birth: [birth year]

## 2021-10-12 IMAGING — CR DG ANKLE COMPLETE 3+V*L*
3 series · 3 of 3 positions shown · non-contrast
Comparison: October 20, 2020 left foot film

CLINICAL DATA: Injury to the left ankle with pain.

EXAM:
LEFT ANKLE COMPLETE - 3+ VIEW

[x ankle lat left]
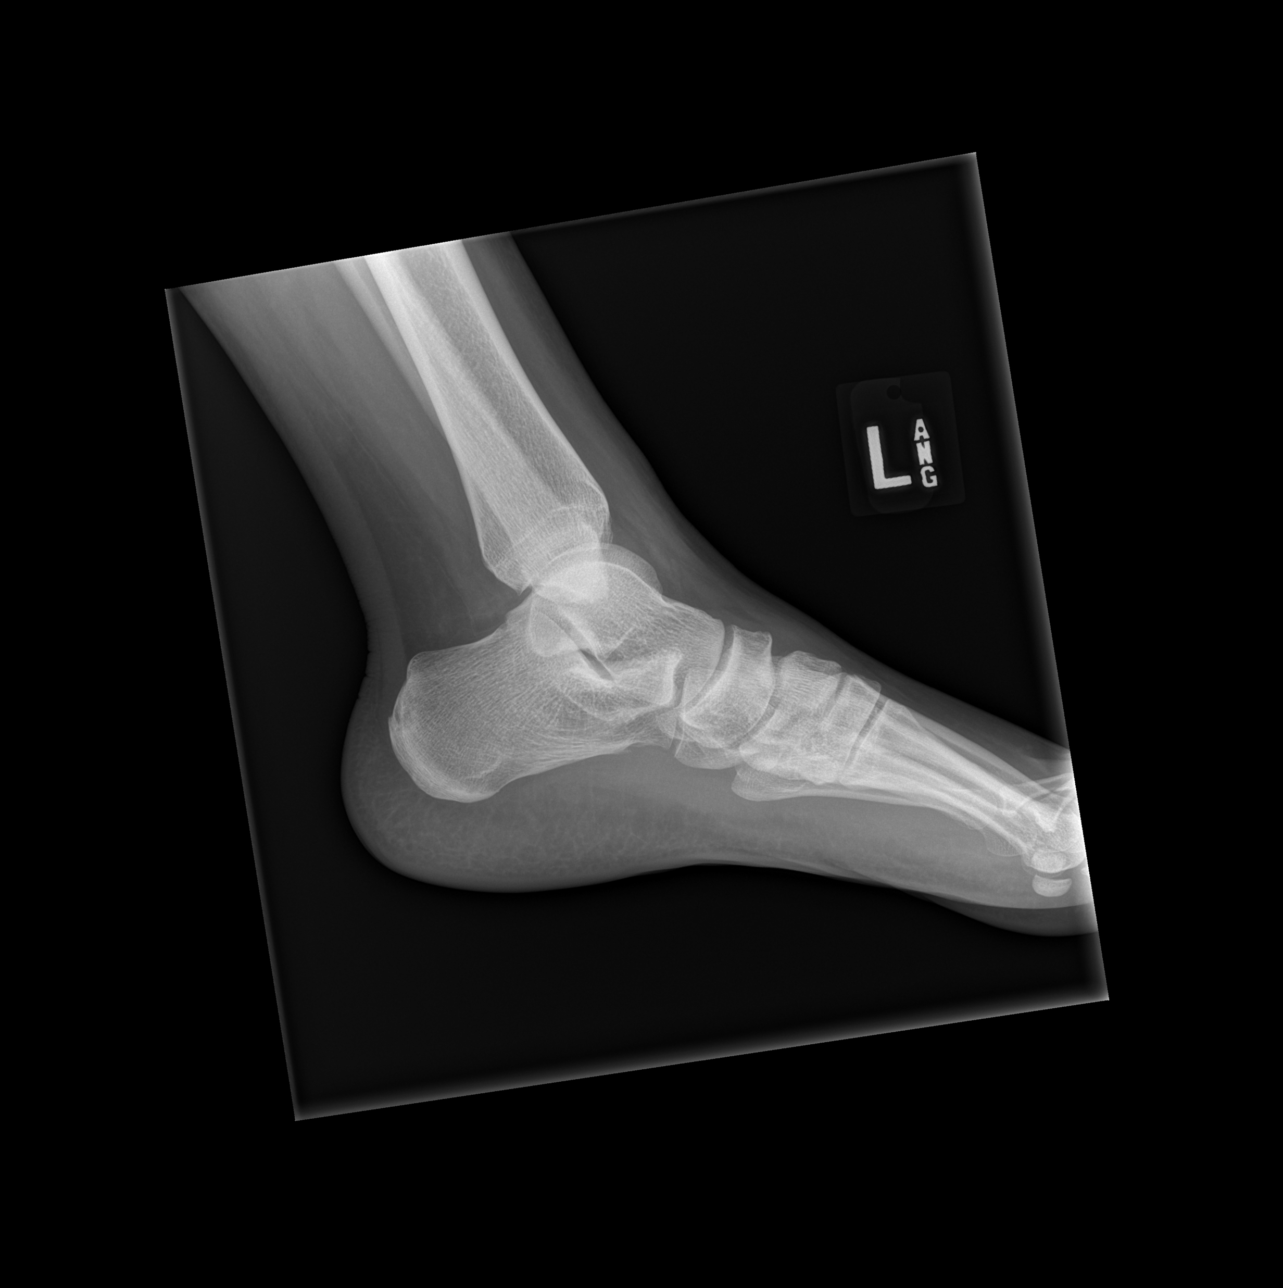

[x ankle ap left]
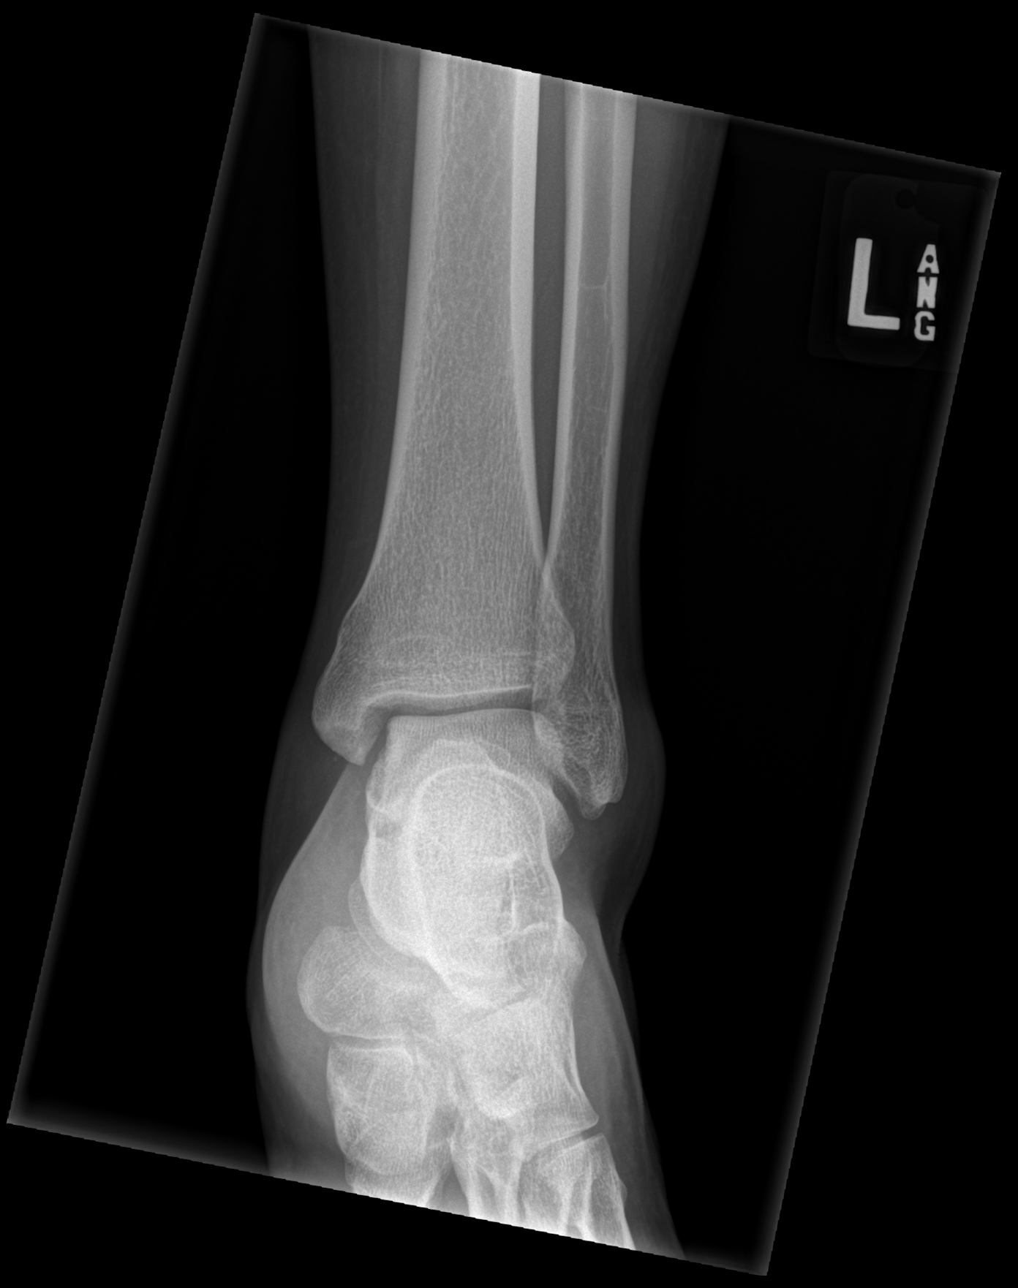

[x ankle obl left]
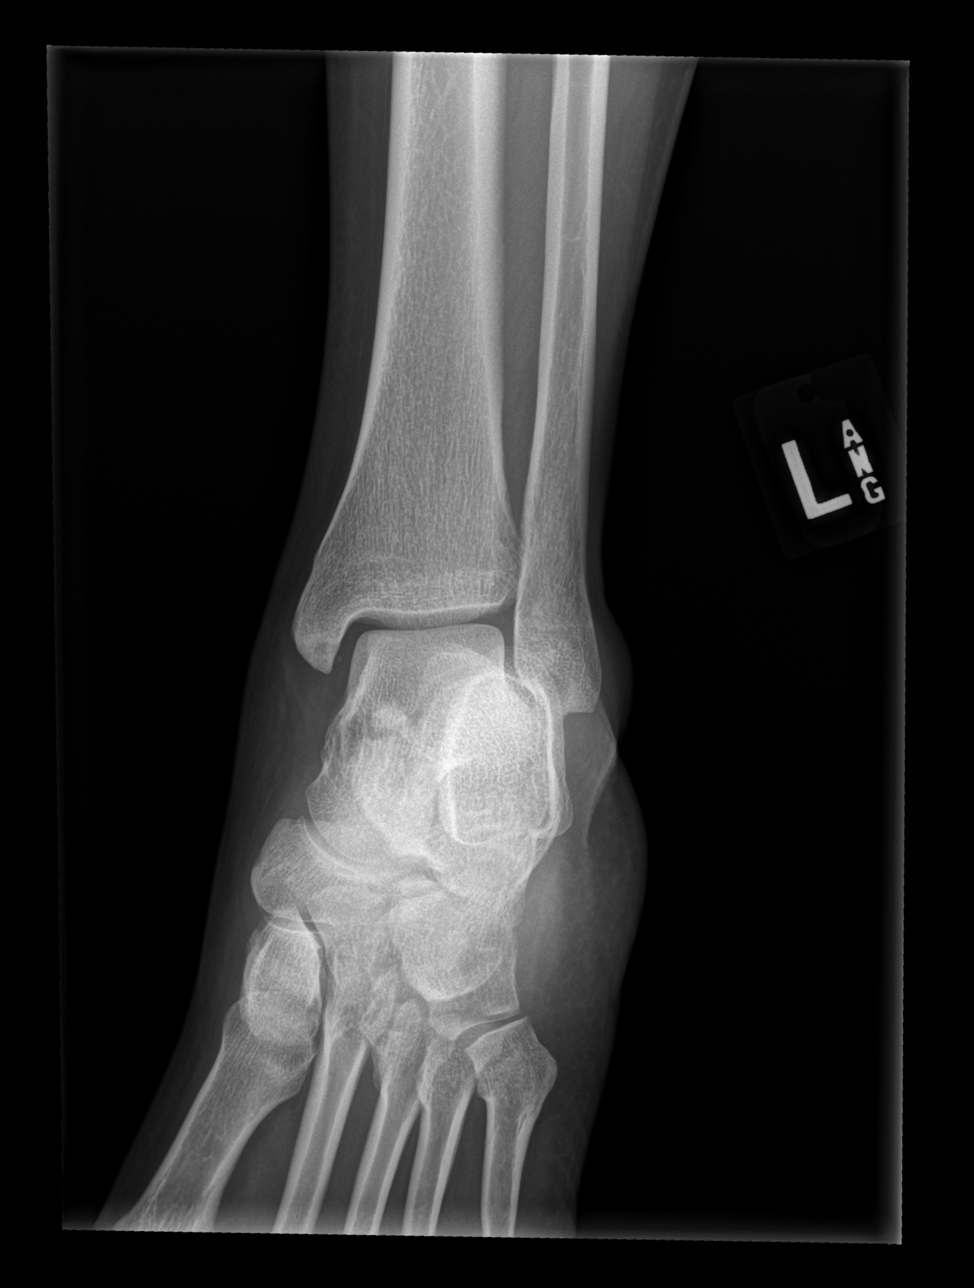

[3 of 3 positions shown; findings below may reference images not displayed]

FINDINGS: There is no evidence of fracture, dislocation, or joint effusion.
There is no evidence of arthropathy or other focal bone abnormality.
Minimal soft tissue swelling over lateral left ankle is identified.
IMPRESSION: No acute fracture or dislocation. Minimal soft tissue swelling over
lateral left ankle is identified.

## 2021-10-14 ENCOUNTER — Encounter: Payer: Self-pay | Admitting: Rehabilitative and Restorative Service Providers"

## 2022-03-01 ENCOUNTER — Ambulatory Visit
Admission: RE | Admit: 2022-03-01 | Discharge: 2022-03-01 | Disposition: A | Discharge status: Home / Self Care | Payer: Medicaid Other | Source: Ambulatory Visit | Attending: Family Medicine | Admitting: Family Medicine

## 2022-03-01 ENCOUNTER — Other Ambulatory Visit: Payer: Self-pay | Admitting: Family Medicine

## 2022-03-01 ENCOUNTER — Other Ambulatory Visit: Payer: Self-pay

## 2022-03-01 DIAGNOSIS — M79645 Pain in left finger(s): Secondary | ICD-10-CM

## 2022-03-27 ENCOUNTER — Encounter: Payer: Self-pay | Admitting: *Deleted

## 2022-05-17 ENCOUNTER — Encounter: Payer: Medicaid Other | Admitting: Family Medicine

## 2022-07-10 ENCOUNTER — Encounter: Payer: Self-pay | Admitting: Obstetrics & Gynecology

## 2022-07-13 ENCOUNTER — Encounter: Payer: Self-pay | Admitting: Obstetrics & Gynecology

## 2022-07-13 ENCOUNTER — Other Ambulatory Visit: Payer: Self-pay

## 2022-07-13 ENCOUNTER — Ambulatory Visit (INDEPENDENT_AMBULATORY_CARE_PROVIDER_SITE_OTHER): Payer: Medicaid Other | Admitting: Obstetrics & Gynecology

## 2022-07-13 ENCOUNTER — Other Ambulatory Visit (HOSPITAL_COMMUNITY)
Admission: RE | Admit: 2022-07-13 | Discharge: 2022-07-13 | Disposition: A | Discharge status: Home / Self Care | Payer: Medicaid Other | Source: Ambulatory Visit | Attending: Family Medicine | Admitting: Family Medicine

## 2022-07-13 VITALS — BP 130/89 | HR 84 | Wt 198.0 lb

## 2022-07-13 DIAGNOSIS — Z01419 Encounter for gynecological examination (general) (routine) without abnormal findings: Secondary | ICD-10-CM

## 2022-07-13 DIAGNOSIS — Z113 Encounter for screening for infections with a predominantly sexual mode of transmission: Secondary | ICD-10-CM | POA: Diagnosis present

## 2022-07-13 DIAGNOSIS — A5901 Trichomonal vulvovaginitis: Secondary | ICD-10-CM

## 2022-07-13 DIAGNOSIS — D219 Benign neoplasm of connective and other soft tissue, unspecified: Secondary | ICD-10-CM | POA: Diagnosis not present

## 2022-07-13 DIAGNOSIS — A749 Chlamydial infection, unspecified: Secondary | ICD-10-CM

## 2022-07-13 DIAGNOSIS — N730 Acute parametritis and pelvic cellulitis: Secondary | ICD-10-CM

## 2022-07-13 DIAGNOSIS — N946 Dysmenorrhea, unspecified: Secondary | ICD-10-CM | POA: Diagnosis not present

## 2022-07-13 HISTORY — DX: Chlamydial infection, unspecified: A74.9

## 2022-07-13 HISTORY — DX: Trichomonal vulvovaginitis: A59.01

## 2022-07-13 NOTE — Patient Instructions (Signed)
Medication for fibroids is called MyF[facility], can research this.

## 2022-07-13 NOTE — Progress Notes (Signed)
GYNECOLOGY ANNUAL PREVENTATIVE CARE ENCOUNTER NOTE  History:     Elizabeth David is a 31 y.o. [phone] female here for a routine annual gynecologic exam.  Current complaints: pelvic pain especially during her periods which she attributes to her fibroids.  She feels they are getting bigger, and her periods are a little heavier.  Takes Tylenol, Ibuprofen and Naproxen to help with the pain.   Denies abnormal vaginal bleeding, discharge, problems with intercourse or other gynecologic concerns.    Gynecologic History Patient's last menstrual period was 07/01/2022. Contraception: condoms Last Pap: 2013. Result was normal   Obstetric History OB History  Gravida Para Term Preterm AB Living  '3 2 1 1 1 2  '$ SAB IAB Ectopic Multiple Live Births  1   0 0 2    # Outcome Date GA Lbr Len/2nd Weight Sex Delivery Anes PTL Lv  3 Preterm 12/04/14 68w4d04:52 / 00:06 6 lb 3.3 oz (2.815 kg) M Vag-Spont EPI  LIV     Birth Comments: none  2 Term 01/18/13 355w0d6:13 / 00:19 6 lb 7.9 oz (2.945 kg) F Vag-Spont EPI  LIV  1 SAB 2013            Past Medical History:  Diagnosis Date   Alcohol abuse    Anemia    Asthma    as child   Genital herpes     Past Surgical History:  Procedure Laterality Date   TONSILLECTOMY     at [age]ears of age    No current outpatient medications on file prior to visit.   No current facility-administered medications on file prior to visit.    No Known Allergies  Social History:  reports that she quit smoking about 10 years ago. Her smoking use included cigarettes. She has never used smokeless tobacco. She reports current alcohol use of about 7.0 standard drinks of alcohol per week. She reports current drug use. Drug: Marijuana.  Family History  Problem Relation Age of Onset   Hypertension Mother    Diabetes Mother    Heart failure Other     The following portions of the patient's history were reviewed and updated as appropriate: allergies, current medications,  past family history, past medical history, past social history, past surgical history and problem list.  Review of Systems Pertinent items noted in HPI and remainder of comprehensive ROS otherwise negative.  Physical Exam:  BP 130/89   Pulse 84   Wt 198 lb (89.8 kg)   LMP 07/01/2022   Breastfeeding No   BMI 32.95 kg/m  CONSTITUTIONAL: Well-developed, well-nourished female in no acute distress.  HENT:  Normocephalic, atraumatic, External right and left ear normal.  EYES: Conjunctivae and EOM are normal. Pupils are equal, round, and reactive to light. No scleral icterus.  NECK: Normal range of motion, supple, no masses.  Normal thyroid.  SKIN: Skin is warm and dry. No rash noted. Not diaphoretic. No erythema. No pallor. MUSCULOSKELETAL: Normal range of motion. No tenderness.  No cyanosis, clubbing, or edema. NEUROLOGIC: Alert and oriented to person, place, and time. Normal reflexes, muscle tone coordination.  PSYCHIATRIC: Normal mood and affect. Normal behavior. Normal judgment and thought content. CARDIOVASCULAR: Normal heart rate noted, regular rhythm RESPIRATORY: Clear to auscultation bilaterally. Effort and breath sounds normal, no problems with respiration noted. BREASTS: Symmetric in size. No masses, tenderness, skin changes, nipple drainage, or lymphadenopathy bilaterally. Performed in the presence of a chaperone. ABDOMEN: Soft, no distention noted.  No tenderness, rebound  or guarding.  PELVIC: Normal appearing external genitalia and urethral meatus; normal appearing vaginal mucosa and cervix with prominent ectropion.  No abnormal vaginal discharge noted.  Pap smear obtained. Some bleeding from ectropion and pain noted during pap, bleeding ameliorated with pressure and silver nitrate.   Enlarged uterine size with diffuse tenderness on palpation, no other palpable masses, no adnexal tenderness.  Performed in the presence of a chaperone.   Assessment and Plan:     1. Dysmenorrhea 2.  Fibroids Recommended proper dosages of Naproxen and Tylenol to help with her pain.  The NSAIDs will help with her slightly increased periods too.  Patient is not enthusiastic about hormonal management, will likely want to discuss fertility-sparing surgical management.  Will obtain ultrasound, she will come back to discuss management.   - US PELVIC COMPLETE WITH TRANSVAGINAL; Future I spent 20 minutes dedicated to the care of this patient including pre-visit review of records, face to face time with the patient discussing her fibroids and dysmenorrhea and treatments and post visit ordering of testing.  3. Routine screening for STI (sexually transmitted infection) STI screen done, will follow up results and manage accordingly. - Cytology - PAP( [place]) ancillary testing - RPR+HBsAg+HCVAb+HIV  4. Well woman exam with routine gynecological exam - Cytology - PAP( [place]) Will follow up results of pap smear and manage accordingly. Routine preventative health maintenance measures emphasized. Please refer to After Visit Summary for other counseling recommendations.      Verita Schneiders, MD, [place] for [employer], [place]

## 2022-07-14 LAB — RPR+HBSAG+HCVAB+...
HIV Screen 4th Generation wRfx: NONREACTIVE
Hep C Virus Ab: NONREACTIVE
Hepatitis B Surface Ag: NEGATIVE
RPR Ser Ql: NONREACTIVE

## 2022-07-17 LAB — CYTOLOGY - PAP
Chlamydia: POSITIVE — AB
Comment: NEGATIVE
Comment: NEGATIVE
Comment: NEGATIVE
Comment: NORMAL
Diagnosis: NEGATIVE
Diagnosis: REACTIVE
High risk HPV: NEGATIVE
Neisseria Gonorrhea: NEGATIVE
Trichomonas: POSITIVE — AB

## 2022-07-18 ENCOUNTER — Encounter: Payer: Self-pay | Admitting: Obstetrics & Gynecology

## 2022-07-18 ENCOUNTER — Telehealth: Payer: Self-pay | Admitting: Family Medicine

## 2022-07-18 DIAGNOSIS — A5901 Trichomonal vulvovaginitis: Secondary | ICD-10-CM

## 2022-07-18 DIAGNOSIS — R110 Nausea: Secondary | ICD-10-CM

## 2022-07-18 DIAGNOSIS — N730 Acute parametritis and pelvic cellulitis: Secondary | ICD-10-CM | POA: Insufficient documentation

## 2022-07-18 MED ORDER — ONDANSETRON HCL 4 MG PO TABS: 4.0000 mg | ORAL_TABLET | Freq: Four times a day (QID) | ORAL | 0 refills | Status: DC | PRN

## 2022-07-18 MED ORDER — DOXYCYCLINE HYCLATE 100 MG PO CAPS: 100.0000 mg | ORAL_CAPSULE | Freq: Two times a day (BID) | ORAL | 0 refills | Status: AC

## 2022-07-18 MED ORDER — AZITHROMYCIN 500 MG PO TABS: 2000.0000 mg | ORAL_TABLET | Freq: Every day | ORAL | 0 refills | Status: DC

## 2022-07-18 MED ORDER — METRONIDAZOLE 500 MG PO TABS: 500.0000 mg | ORAL_TABLET | Freq: Two times a day (BID) | ORAL | 0 refills | Status: AC

## 2022-07-18 NOTE — Addendum Note (Signed)
Addended by: Louisa Second E on: 07/18/2022 12:50 PM   Modules accepted: Orders

## 2022-07-18 NOTE — Addendum Note (Signed)
Addended by: Verita Schneiders A on: 07/18/2022 12:07 PM   Modules accepted: Orders

## 2022-07-18 NOTE — Progress Notes (Signed)
Patient has chlamydia and trichomonal vaginitis. These can also cause a lot of pelvic pain as she was experiencing, she likely has pelvic inflammatory disease. Had negative testing for other STIs.  Patient needs to let partner(s) know so the partner(s) can get testing and treatment. Patient and sex partner(s) should abstain from unprotected sexual activity for seven days after everyone receives appropriate treatment.  Doxycycline and Metronidazole were prescribed for patient to treat for PID and the STIs.  Patient will need to return in about 4 weeks after treatment for repeat test of cure.  Please call to inform patient of results and recommendations, and advise to pick up prescription and take as directed.  Please advise patient to practice safe sex at all times.  Elizabeth Schneiders, MD, [place] for [employer], Elizabeth David

## 2022-07-18 NOTE — Telephone Encounter (Addendum)
Elizabeth Oman, MD  P [facility] Pool Patient has chlamydia and trichomonal vaginitis. These can also cause a lot of pelvic pain as she was experiencing, she likely has pelvic inflammatory disease. Had negative testing for other STIs.  Patient needs to let partner(s) know so the partner(s) can get testing and treatment. Patient and sex partner(s) should abstain from unprotected sexual activity for seven days after everyone receives appropriate treatment.  Doxycycline and Metronidazole were prescribed for patient to treat for PID and the STIs.  Patient will need to return in about 4 weeks after treatment for repeat test of cure.  Please call to inform patient of results and recommendations, and advise to pick up prescription and take as directed.  Please advise patient to practice safe sex at all times.   Verita Schneiders, MD, [place] for [place], [place]   ------------------------------------------------------------------------------------------------------------------------------- Per chart review patient tested positive for Chlamydia and Trichomonas. Returned call to patient. Pt requests one time dose of antibiotics instead of Flagyl; pt reports she is unable to take Flagyl due to alcohol dependence. STD report sent to [facility]. Per Anyanwu MD patient has PID and will need long course of antibiotics. Single dose treatment discontinued and pharmacy notified of need to cancel this order and fill Flagyl. Zofran PRN for nausea ordered per Anyanwu, MD. Pt made aware that due to PID single dose antibiotic will not be effective treatment for infection and she will need longer course. Pt verbalizes understanding.

## 2022-07-18 NOTE — Telephone Encounter (Deleted)
Message Received: Today Elizabeth David, Elizabeth David Havers, MD  P [facility] Clinical Pool Patient has chlamydia and trichomonal vaginitis. These can also cause a lot of pelvic pain as she was experiencing, she likely has pelvic inflammatory disease. Had negative testing for other STIs.  Patient needs to let partner(s) know so the partner(s) can get testing and treatment. Patient and sex partner(s) should abstain from unprotected sexual activity for seven days after everyone receives appropriate treatment.  Doxycycline and Metronidazole were prescribed for patient to treat for PID and the STIs.  Patient will need to return in about 4 weeks after treatment for repeat test of cure.  Please call to inform patient of results and recommendations, and advise to pick up prescription and take as directed.  Please advise patient to practice safe sex at all times.   Verita Schneiders, MD, [place] for [employer], [place]

## 2022-07-18 NOTE — Telephone Encounter (Signed)
Patient got her test results back and she is requesting a Rx , patient would like a call.     [facility] on [name]

## 2022-07-20 ENCOUNTER — Ambulatory Visit
Admission: RE | Admit: 2022-07-20 | Discharge: 2022-07-20 | Disposition: A | Discharge status: Home / Self Care | Payer: Medicaid Other | Source: Ambulatory Visit | Attending: Obstetrics & Gynecology | Admitting: Obstetrics & Gynecology

## 2022-07-20 DIAGNOSIS — D219 Benign neoplasm of connective and other soft tissue, unspecified: Secondary | ICD-10-CM | POA: Insufficient documentation

## 2022-07-20 DIAGNOSIS — N946 Dysmenorrhea, unspecified: Secondary | ICD-10-CM | POA: Insufficient documentation

## 2022-08-01 ENCOUNTER — Telehealth: Payer: Self-pay | Admitting: Family Medicine

## 2022-08-01 DIAGNOSIS — B379 Candidiasis, unspecified: Secondary | ICD-10-CM

## 2022-08-01 MED ORDER — FLUCONAZOLE 150 MG PO TABS: 150.0000 mg | ORAL_TABLET | Freq: Once | ORAL | 0 refills | Status: AC

## 2022-08-01 NOTE — Telephone Encounter (Signed)
Per chart review patient was recently prescribed doxycycline and flagyl. Called pt who reports vaginal itching. Rx sent per protocol for yeast infection.

## 2022-08-01 NOTE — Telephone Encounter (Signed)
Patient is requesting a Rx for a Yeast inf.Elizabeth David

## 2022-08-17 ENCOUNTER — Other Ambulatory Visit: Payer: Self-pay

## 2022-08-17 ENCOUNTER — Ambulatory Visit (INDEPENDENT_AMBULATORY_CARE_PROVIDER_SITE_OTHER): Payer: Medicaid Other | Admitting: Family Medicine

## 2022-08-17 ENCOUNTER — Other Ambulatory Visit (HOSPITAL_COMMUNITY)
Admission: RE | Admit: 2022-08-17 | Discharge: 2022-08-17 | Disposition: A | Discharge status: Home / Self Care | Payer: Medicaid Other | Source: Ambulatory Visit | Attending: Obstetrics & Gynecology | Admitting: Obstetrics & Gynecology

## 2022-08-17 ENCOUNTER — Ambulatory Visit: Payer: Medicaid Other | Admitting: Family Medicine

## 2022-08-17 ENCOUNTER — Encounter: Payer: Self-pay | Admitting: Family Medicine

## 2022-08-17 VITALS — BP 127/80 | HR 84 | Wt 196.7 lb

## 2022-08-17 DIAGNOSIS — A5901 Trichomonal vulvovaginitis: Secondary | ICD-10-CM | POA: Diagnosis not present

## 2022-08-17 DIAGNOSIS — D219 Benign neoplasm of connective and other soft tissue, unspecified: Secondary | ICD-10-CM | POA: Diagnosis not present

## 2022-08-17 DIAGNOSIS — A749 Chlamydial infection, unspecified: Secondary | ICD-10-CM

## 2022-08-17 NOTE — Assessment & Plan Note (Signed)
TOC today--not sure how much this contributed to her sx's.

## 2022-08-17 NOTE — Assessment & Plan Note (Signed)
Discussed all treatment options to include [place] oral or IM, contraceptive choices, Sonata, UFE, myomectomy and hysterectomy as well as lifestyle choices. She listened thoughtfully and will continue to research. She did not want to pursue treatment at this time.

## 2022-08-17 NOTE — Progress Notes (Signed)
   Subjective:    Patient ID: Elizabeth David is a 31 y.o. female presenting with Follow-up  on 08/17/2022  HPI: Here to f/u fibroids. Had some significant pain and bleeding. Was positive for Chlam and trich. She has taken her meds. She is interested in keeping her fertility. Has not had a lot of luck with contraceptives or hormones. U/S revealed some enlargement and multiple ut overall size of uterus is similar to previous. Has FH of fibroids.  Review of Systems  Constitutional:  Negative for chills and fever.  Respiratory:  Negative for shortness of breath.   Cardiovascular:  Negative for chest pain.  Gastrointestinal:  Negative for abdominal pain, nausea and vomiting.  Genitourinary:  Negative for dysuria.  Skin:  Negative for rash.      Objective:    BP 127/80   Pulse 84   Wt 196 lb 11.2 oz (89.2 kg)   BMI 32.73 kg/m  Physical Exam Exam conducted with a chaperone present.  Constitutional:      General: She is not in acute distress.    Appearance: She is well-developed.  HENT:     Head: Normocephalic and atraumatic.  Eyes:     General: No scleral icterus. Cardiovascular:     Rate and Rhythm: Normal rate.  Pulmonary:     Effort: Pulmonary effort is normal.  Abdominal:     Palpations: Abdomen is soft.  Musculoskeletal:     Cervical back: Neck supple.  Skin:    General: Skin is warm and dry.  Neurological:     Mental Status: She is alert and oriented to person, place, and time.         Assessment & Plan:   Problem List Items Addressed This Visit       Unprioritized   Chlamydia    TOC today--not sure how much this contributed to her sx's.       Relevant Orders   Cervicovaginal ancillary only( [place])   Trichomonal vaginitis - Primary   Relevant Orders   Cervicovaginal ancillary only( [place])   Fibroids    Discussed all treatment options to include [place] oral or IM, contraceptive choices, Sonata, UFE, myomectomy and hysterectomy as well as  lifestyle choices. She listened thoughtfully and will continue to research. She did not want to pursue treatment at this time.       Return in about 3 months (around 11/16/2022).  Donnamae Jude, MD 08/17/2022 9:32 AM

## 2022-08-18 LAB — CERVICOVAGINAL ANCILLARY ONLY
Chlamydia: NEGATIVE
Comment: NEGATIVE
Comment: NEGATIVE
Comment: NORMAL
Neisseria Gonorrhea: NEGATIVE
Trichomonas: NEGATIVE

## 2022-10-18 ENCOUNTER — Encounter: Payer: Self-pay | Admitting: *Deleted

## 2022-10-20 ENCOUNTER — Ambulatory Visit: Payer: Medicaid Other

## 2022-11-27 ENCOUNTER — Ambulatory Visit (INDEPENDENT_AMBULATORY_CARE_PROVIDER_SITE_OTHER): Payer: Medicaid Other

## 2022-11-27 ENCOUNTER — Other Ambulatory Visit (HOSPITAL_COMMUNITY)
Admission: RE | Admit: 2022-11-27 | Discharge: 2022-11-27 | Disposition: A | Discharge status: Home / Self Care | Payer: Medicaid Other | Source: Ambulatory Visit | Attending: Family Medicine | Admitting: Family Medicine

## 2022-11-27 ENCOUNTER — Other Ambulatory Visit: Payer: Self-pay

## 2022-11-27 VITALS — BP 158/97 | HR 74 | Wt 196.2 lb

## 2022-11-27 DIAGNOSIS — N898 Other specified noninflammatory disorders of vagina: Secondary | ICD-10-CM | POA: Insufficient documentation

## 2022-11-27 DIAGNOSIS — Z113 Encounter for screening for infections with a predominantly sexual mode of transmission: Secondary | ICD-10-CM | POA: Diagnosis present

## 2022-11-27 MED ORDER — METRONIDAZOLE 500 MG PO TABS: 500.0000 mg | ORAL_TABLET | Freq: Two times a day (BID) | ORAL | 0 refills | Status: DC

## 2022-11-27 NOTE — Progress Notes (Unsigned)
Elizabeth David is here with complaint of vaginal irritation with foul smelling discharge. Denies vaginal itching. Discharge is clear and watery. Patient reports she has trialed apple cider vinegar and baking soda bath. Has also used [place] brand OTC treatment. States odor goes away, but returns. History of trich and chlamydia on 07/13/22; TOC was negative on 08/17/22. Self swab instructions given and specimen obtained. Explained patient will be contacted with any abnormal results. Pt desires treatment of possible BV per protocol.   BP elevated at visit today; recheck also elevated. Pt denies any history of elevated BP. Does have PCP established but would like to change providers. Reviewed how to schedule PCP new patient visit with [facility] online.  Annabell Howells, RN 11/27/2022  9:41 AM

## 2022-11-28 LAB — CERVICOVAGINAL ANCILLARY ONLY
Bacterial Vaginitis (gardnerella): POSITIVE — AB
Candida Glabrata: NEGATIVE
Candida Vaginitis: NEGATIVE
Chlamydia: NEGATIVE
Comment: NEGATIVE
Comment: NEGATIVE
Comment: NEGATIVE
Comment: NEGATIVE
Comment: NEGATIVE
Comment: NORMAL
Neisseria Gonorrhea: NEGATIVE
Trichomonas: NEGATIVE

## 2023-01-09 ENCOUNTER — Encounter: Payer: Self-pay | Admitting: *Deleted

## 2023-01-09 ENCOUNTER — Ambulatory Visit
Admission: EM | Admit: 2023-01-09 | Discharge: 2023-01-09 | Disposition: A | Discharge status: Home / Self Care | Payer: Medicaid Other | Attending: Internal Medicine | Admitting: Internal Medicine

## 2023-01-09 ENCOUNTER — Other Ambulatory Visit: Payer: Self-pay

## 2023-01-09 DIAGNOSIS — J3489 Other specified disorders of nose and nasal sinuses: Secondary | ICD-10-CM | POA: Diagnosis not present

## 2023-01-09 DIAGNOSIS — R110 Nausea: Secondary | ICD-10-CM

## 2023-01-09 DIAGNOSIS — B349 Viral infection, unspecified: Secondary | ICD-10-CM

## 2023-01-09 MED ORDER — PREDNISONE 20 MG PO TABS: 40.0000 mg | ORAL_TABLET | Freq: Every day | ORAL | 0 refills | Status: AC

## 2023-01-09 MED ORDER — ONDANSETRON 4 MG PO TBDP: 4.0000 mg | ORAL_TABLET | Freq: Three times a day (TID) | ORAL | 0 refills | Status: DC | PRN

## 2023-01-09 NOTE — Discharge Instructions (Signed)
It appears that you have a persistent viral illness.  I have prescribed prednisone to help alleviate the symptoms.  I have also prescribed a nausea medication for you to take as needed.  Ensure adequate fluid hydration and rest.  Follow-up if symptoms persist or worsen.

## 2023-01-09 NOTE — ED Provider Notes (Signed)
[facility]    CSN: [id number] Arrival date & time: 01/09/23  1440      History   Chief Complaint Chief Complaint  Patient presents with   Nausea   head aches    HPI Elizabeth David is a 32 y.o. female.   Patient presents with nausea without vomiting, sinus pressure, headache, nasal drainage, mild nonproductive cough, diarrhea that started about 5 to 6 days ago.  Reports diarrhea is mild and intermittent.  Denies blood in stool.  Denies any associated known fevers or sick contacts.  Denies chest pain, shortness of breath, abdominal pain.  Denies history of asthma.  Patient has taken over-the-counter medications with minimal improvement in symptoms.  Last menstrual cycle completed a few days ago.     Past Medical History:  Diagnosis Date   Alcohol abuse    Anemia    Asthma    as child   Chlamydia 07/13/2022   Genital herpes    Trichomonal vaginitis 07/13/2022    Patient Active Problem List   Diagnosis Date Noted   Fibroids 08/17/2022   Acute PID (pelvic inflammatory disease) 07/18/2022   Chlamydia 07/13/2022   Trichomonal vaginitis 07/13/2022   Substance induced mood disorder ([place]) 10/24/2017   BACK STRAIN, LUMBAR 06/03/2010   MORBID OBESITY 11/03/2009   VITAMIN B12 DEFICIENCY 05/14/2009   FATIGUE 05/06/2009   PAIN IN JOINT, ANKLE AND FOOT 08/13/2008    Past Surgical History:  Procedure Laterality Date   TONSILLECTOMY     at 32 years of age    OB History     Gravida  4   Para  2   Term  1   Preterm  1   AB  1   Living  2      SAB  1   IAB      Ectopic  0   Multiple  0   Live Births  2            Home Medications    Prior to Admission medications   Medication Sig Start Date End Date Taking? Authorizing Provider  ondansetron (ZOFRAN-ODT) 4 MG disintegrating tablet Take 1 tablet (4 mg total) by mouth every 8 (eight) hours as needed for nausea or vomiting. 01/09/23  Yes Ruhan Borak, Hildred Alamin E, FNP  predniSONE (DELTASONE) 20 MG  tablet Take 2 tablets (40 mg total) by mouth daily for 5 days. 01/09/23 01/14/23 Yes Leiyah Maultsby, Hildred Alamin E, FNP  B Complex-C-Biotin-E-FA 0.4 MG TABS Take by mouth daily.    [provider]  metroNIDAZOLE (FLAGYL) 500 MG tablet Take 1 tablet (500 mg total) by mouth 2 (two) times daily. 11/27/22   Manya Silvas, CNM    Family History Family History  Problem Relation Age of Onset   Hypertension Mother    Diabetes Mother    Heart failure Other     Social History Social History   Tobacco Use   Smoking status: Former    Packs/day: 0.00    Years: 3.00    Total pack years: 0.00    Types: Cigarettes    Quit date: 06/16/2012    Years since quitting: 10.5   Smokeless tobacco: Never  Vaping Use   Vaping Use: Never used  Substance Use Topics   Alcohol use: Yes    Alcohol/week: 7.0 standard drinks of alcohol    Types: 7 Standard drinks or equivalent per week    Comment: per week   Drug use: Yes    Types: Marijuana  Comment: daily     Allergies   Patient has no known allergies.   Review of Systems Review of Systems Per HPI  Physical Exam Triage Vital Signs ED Triage Vitals  Enc Vitals Group     BP 01/09/23 1448 (!) 145/90     Pulse Rate 01/09/23 1448 92     Resp 01/09/23 1448 20     Temp 01/09/23 1448 99.3 F (37.4 C)     Temp src --      SpO2 01/09/23 1448 98 %     Weight --      Height --      Head Circumference --      Peak Flow --      Pain Score 01/09/23 1446 7     Pain Loc --      Pain Edu? --      Excl. in [place]? --    No data found.  Updated Vital Signs BP (!) 145/90   Pulse 92   Temp 99.3 F (37.4 C)   Resp 20   LMP 01/04/2023   SpO2 98%   Visual Acuity Right Eye Distance:   Left Eye Distance:   Bilateral Distance:    Right Eye Near:   Left Eye Near:    Bilateral Near:     Physical Exam Constitutional:      General: She is not in acute distress.    Appearance: Normal appearance. She is not toxic-appearing or diaphoretic.  HENT:      Head: Normocephalic and atraumatic.     Right Ear: Tympanic membrane and ear canal normal.     Left Ear: Tympanic membrane and ear canal normal.     Nose: Congestion present.     Mouth/Throat:     Mouth: Mucous membranes are moist.     Pharynx: No posterior oropharyngeal erythema.  Eyes:     Extraocular Movements: Extraocular movements intact.     Conjunctiva/sclera: Conjunctivae normal.     Pupils: Pupils are equal, round, and reactive to light.  Cardiovascular:     Rate and Rhythm: Normal rate and regular rhythm.     Pulses: Normal pulses.     Heart sounds: Normal heart sounds.  Pulmonary:     Effort: Pulmonary effort is normal. No respiratory distress.     Breath sounds: Normal breath sounds. No stridor. No wheezing, rhonchi or rales.  Abdominal:     General: Abdomen is flat. Bowel sounds are normal. There is no distension.     Palpations: Abdomen is soft.     Tenderness: There is no abdominal tenderness.  Musculoskeletal:        General: Normal range of motion.     Cervical back: Normal range of motion.  Skin:    General: Skin is warm and dry.  Neurological:     General: No focal deficit present.     Mental Status: She is alert and oriented to person, place, and time. Mental status is at baseline.  Psychiatric:        Mood and Affect: Mood normal.        Behavior: Behavior normal.      UC Treatments / Results  Labs (all labs ordered are listed, but only abnormal results are displayed) Labs Reviewed - No data to display  EKG   Radiology No results found.  Procedures Procedures (including critical care time)  Medications Ordered in UC Medications - No data to display  Initial Impression / Assessment and Plan / UC  Course  I have reviewed the triage vital signs and the nursing notes.  Pertinent labs & imaging results that were available during my care of the patient were reviewed by me and considered in my medical decision making (see chart for details).      Patient's symptoms appear viral in etiology.  There are no signs of dehydration or acute abdomen on exam so do not think that emergent evaluation is necessary.  Patient offered COVID testing but declined.  Given duration of symptoms and significant sinus pressure, will treat with prednisone.  No contraindications to prednisone noted in patient's history.  Patient was advised to follow-up if symptoms persist or worsen.  Patient verbalized understanding and was agreeable with plan. Final Clinical Impressions(s) / UC Diagnoses   Final diagnoses:  Sinus pressure  Nausea without vomiting  Viral illness     Discharge Instructions      It appears that you have a persistent viral illness.  I have prescribed prednisone to help alleviate the symptoms.  I have also prescribed a nausea medication for you to take as needed.  Ensure adequate fluid hydration and rest.  Follow-up if symptoms persist or worsen.    ED Prescriptions     Medication Sig Dispense Auth. Provider   predniSONE (DELTASONE) 20 MG tablet Take 2 tablets (40 mg total) by mouth daily for 5 days. 10 tablet [place], Hildred Alamin E, FNP   ondansetron (ZOFRAN-ODT) 4 MG disintegrating tablet Take 1 tablet (4 mg total) by mouth every 8 (eight) hours as needed for nausea or vomiting. 20 tablet [place], Michele Rockers, [place]      PDMP not reviewed this encounter.   Teodora Medici, [place] 01/09/23 [phone]

## 2023-01-09 NOTE — ED Triage Notes (Signed)
Pt reports for past 5-6 days she has had a HA with nausea.

## 2023-05-15 ENCOUNTER — Other Ambulatory Visit: Payer: Self-pay

## 2023-05-15 ENCOUNTER — Ambulatory Visit
Admission: EM | Admit: 2023-05-15 | Discharge: 2023-05-15 | Disposition: A | Discharge status: Home / Self Care | Payer: Medicaid Other | Attending: Family Medicine | Admitting: Family Medicine

## 2023-05-15 ENCOUNTER — Encounter: Payer: Self-pay | Admitting: Emergency Medicine

## 2023-05-15 DIAGNOSIS — Z23 Encounter for immunization: Secondary | ICD-10-CM | POA: Diagnosis not present

## 2023-05-15 DIAGNOSIS — S60419A Abrasion of unspecified finger, initial encounter: Secondary | ICD-10-CM | POA: Diagnosis not present

## 2023-05-15 MED ORDER — TETANUS-DIPHTH-ACELL PERTUSSIS 5-2.5-18.5 LF-MCG/0.5 IM SUSY
0.5000 mL | PREFILLED_SYRINGE | Freq: Once | INTRAMUSCULAR | Status: AC
  Administered 2023-05-15: 0.5 mL via INTRAMUSCULAR

## 2023-05-15 NOTE — ED Triage Notes (Signed)
Pt here for laceration to left ring finger with knife this afternoon; bleeding controlled with bandage at present

## 2023-05-15 NOTE — ED Provider Notes (Signed)
[facility]    CSN: [id number] Arrival date & time: 05/15/23  1720      History   Chief Complaint Chief Complaint  Patient presents with   Laceration    HPI Elizabeth David is a 32 y.o. female.    Laceration  here for a cut on her left ring finger.  It happened when she was cutting food in her kitchen.  She has allergy medication  Last menstrual cycle was about 3 weeks ago  Last tetanus was about 10 years ago  Past Medical History:  Diagnosis Date   Alcohol abuse    Anemia    Asthma    as child   Chlamydia 07/13/2022   Genital herpes    Trichomonal vaginitis 07/13/2022    Patient Active Problem List   Diagnosis Date Noted   Fibroids 08/17/2022   Acute PID (pelvic inflammatory disease) 07/18/2022   Chlamydia 07/13/2022   Trichomonal vaginitis 07/13/2022   Substance induced mood disorder (HCC) 10/24/2017   BACK STRAIN, LUMBAR 06/03/2010   MORBID OBESITY 11/03/2009   VITAMIN B12 DEFICIENCY 05/14/2009   FATIGUE 05/06/2009   PAIN IN JOINT, ANKLE AND FOOT 08/13/2008    Past Surgical History:  Procedure Laterality Date   TONSILLECTOMY     at 32 years of age    OB History     Gravida  9   Para  2   Term  1   Preterm  1   AB  1   Living  2      SAB  1   IAB      Ectopic  0   Multiple  0   Live Births  2            Home Medications    Prior to Admission medications   Medication Sig Start Date End Date Taking? Authorizing Provider  B Complex-C-Biotin-E-FA 0.4 MG TABS Take by mouth daily.    [provider]    Family History Family History  Problem Relation Age of Onset   Hypertension Mother    Diabetes Mother    Heart failure Other     Social History Social History   Tobacco Use   Smoking status: Former    Packs/day: 0.00    Years: 3.00    Additional pack years: 0.00    Total pack years: 0.00    Types: Cigarettes    Quit date: 06/16/2012    Years since quitting: 10.9   Smokeless tobacco: Never   Vaping Use   Vaping Use: Never used  Substance Use Topics   Alcohol use: Yes    Alcohol/week: 7.0 standard drinks of alcohol    Types: 7 Standard drinks or equivalent per week    Comment: per week   Drug use: Yes    Types: Marijuana    Comment: daily     Allergies   Patient has no known allergies.   Review of Systems Review of Systems   Physical Exam Triage Vital Signs ED Triage Vitals  Enc Vitals Group     BP 05/15/23 1732 128/85     Pulse Rate 05/15/23 1732 98     Resp 05/15/23 1732 18     Temp 05/15/23 1732 (!) 95 F (35 C)     Temp Source 05/15/23 1732 Oral     SpO2 05/15/23 1732 95 %     Weight --      Height --      Head Circumference --  Peak Flow --      Pain Score 05/15/23 1733 6     Pain Loc --      Pain Edu? --      Excl. in GC? --    No data found.  Updated Vital Signs BP 128/85 (BP Location: Left Arm)   Pulse 98   Temp (!) 95 F (35 C) (Oral)   Resp 18   SpO2 95%   Visual Acuity Right Eye Distance:   Left Eye Distance:   Bilateral Distance:    Right Eye Near:   Left Eye Near:    Bilateral Near:     Physical Exam Vitals reviewed.  Constitutional:      General: She is not in acute distress.    Appearance: She is not ill-appearing, toxic-appearing or diaphoretic.  HENT:     Mouth/Throat:     Mouth: Mucous membranes are moist.  Eyes:     Extraocular Movements: Extraocular movements intact.     Pupils: Pupils are equal, round, and reactive to light.  Skin:    Coloration: Skin is not pale.     Comments: There is a full-thickness abrasion on the dorsal side of her left ring finger.  It is about 1 cm x 0.5 cm.  Bleeding is controlled when I examine it.  Neurological:     Mental Status: She is alert and oriented to person, place, and time.  Psychiatric:        Behavior: Behavior normal.      UC Treatments / Results  Labs (all labs ordered are listed, but only abnormal results are displayed) Labs Reviewed - No data to  display  EKG   Radiology No results found.  Procedures Procedures (including critical care time)  Medications Ordered in UC Medications  Tdap (BOOSTRIX) injection 0.5 mL (has no administration in time range)    Initial Impression / Assessment and Plan / UC Course  I have reviewed the triage vital signs and the nursing notes.  Pertinent labs & imaging results that were available during my care of the patient were reviewed by me and considered in my medical decision making (see chart for details).        The abrasion is too large for the skin edges to come together with suturing.  Bacitracin and a pressure bandage were applied.  She will take that bandage off in the morning and start doing wound care twice daily. Tdap is given to boost her immunity. Final Clinical Impressions(s) / UC Diagnoses   Final diagnoses:  Abrasion of finger, initial encounter     Discharge Instructions      1-2 times daily, clean the wound with soapy water and then put new antibiotic ointment on the new bandage. Leave the pressure bandage we put on tonight on there until in the morning.  You have been given a Tdap vaccination to boost your tetanus immunity       ED Prescriptions   None    PDMP not reviewed this encounter.   Zenia Resides, MD 05/15/23 [phone]

## 2023-05-15 NOTE — Discharge Instructions (Signed)
1-2 times daily, clean the wound with soapy water and then put new antibiotic ointment on the new bandage. Leave the pressure bandage we put on tonight on there until in the morning.  You have been given a Tdap vaccination to boost your tetanus immunity
